# Patient Record
Sex: Female | Born: 1965 | Race: White | Hispanic: No | Smoking: Never smoker
Health system: Southern US, Community
[De-identification: ages and names within clinical notes are randomized; demographics above are authoritative.]

## PROBLEM LIST (undated history)

## (undated) DIAGNOSIS — I10 Essential (primary) hypertension: Secondary | ICD-10-CM

## (undated) DIAGNOSIS — E079 Disorder of thyroid, unspecified: Secondary | ICD-10-CM

## (undated) DIAGNOSIS — Z803 Family history of malignant neoplasm of breast: Secondary | ICD-10-CM

## (undated) DIAGNOSIS — E119 Type 2 diabetes mellitus without complications: Secondary | ICD-10-CM

## (undated) DIAGNOSIS — R7303 Prediabetes: Secondary | ICD-10-CM

## (undated) DIAGNOSIS — M549 Dorsalgia, unspecified: Secondary | ICD-10-CM

## (undated) DIAGNOSIS — Z800 Family history of malignant neoplasm of digestive organs: Secondary | ICD-10-CM

## (undated) DIAGNOSIS — M2550 Pain in unspecified joint: Secondary | ICD-10-CM

## (undated) DIAGNOSIS — IMO0001 Reserved for inherently not codable concepts without codable children: Secondary | ICD-10-CM

## (undated) DIAGNOSIS — R600 Localized edema: Secondary | ICD-10-CM

## (undated) DIAGNOSIS — Z83719 Family history of colon polyps, unspecified: Secondary | ICD-10-CM

## (undated) DIAGNOSIS — Z8371 Family history of colonic polyps: Secondary | ICD-10-CM

## (undated) HISTORY — DX: Disorder of thyroid, unspecified: E07.9

## (undated) HISTORY — DX: Reserved for inherently not codable concepts without codable children: IMO0001

## (undated) HISTORY — DX: Family history of malignant neoplasm of digestive organs: Z80.0

## (undated) HISTORY — DX: Family history of colonic polyps: Z83.71

## (undated) HISTORY — DX: Localized edema: R60.0

## (undated) HISTORY — DX: Essential (primary) hypertension: I10

## (undated) HISTORY — DX: Pain in unspecified joint: M25.50

## (undated) HISTORY — DX: Dorsalgia, unspecified: M54.9

## (undated) HISTORY — PX: TONSILLECTOMY: SUR1361

## (undated) HISTORY — DX: Family history of malignant neoplasm of breast: Z80.3

## (undated) HISTORY — DX: Family history of colon polyps, unspecified: Z83.719

## (undated) HISTORY — DX: Type 2 diabetes mellitus without complications: E11.9

## (undated) HISTORY — DX: Prediabetes: R73.03

---

## 1999-01-05 ENCOUNTER — Other Ambulatory Visit: Admission: RE | Admit: 1999-01-05 | Discharge: 1999-01-05 | Payer: Self-pay | Admitting: Gynecology

## 2000-03-08 ENCOUNTER — Encounter: Payer: Self-pay | Admitting: Family Medicine

## 2000-03-08 ENCOUNTER — Ambulatory Visit (HOSPITAL_COMMUNITY): Admission: RE | Admit: 2000-03-08 | Discharge: 2000-03-08 | Payer: Self-pay | Admitting: Family Medicine

## 2000-03-20 ENCOUNTER — Encounter: Admission: RE | Admit: 2000-03-20 | Discharge: 2000-03-20 | Payer: Self-pay | Admitting: Family Medicine

## 2000-03-20 ENCOUNTER — Encounter: Payer: Self-pay | Admitting: Family Medicine

## 2000-05-05 ENCOUNTER — Other Ambulatory Visit: Admission: RE | Admit: 2000-05-05 | Discharge: 2000-05-05 | Payer: Self-pay | Admitting: Gynecology

## 2001-05-25 ENCOUNTER — Other Ambulatory Visit: Admission: RE | Admit: 2001-05-25 | Discharge: 2001-05-25 | Payer: Self-pay | Admitting: Gynecology

## 2002-07-11 ENCOUNTER — Other Ambulatory Visit: Admission: RE | Admit: 2002-07-11 | Discharge: 2002-07-11 | Payer: Self-pay | Admitting: Gynecology

## 2003-09-22 ENCOUNTER — Other Ambulatory Visit: Admission: RE | Admit: 2003-09-22 | Discharge: 2003-09-22 | Payer: Self-pay | Admitting: Gynecology

## 2003-12-30 ENCOUNTER — Encounter: Admission: RE | Admit: 2003-12-30 | Discharge: 2003-12-30 | Payer: Self-pay | Admitting: Gynecology

## 2004-09-23 ENCOUNTER — Ambulatory Visit (HOSPITAL_COMMUNITY): Admission: RE | Admit: 2004-09-23 | Discharge: 2004-09-23 | Payer: Self-pay | Admitting: Gynecology

## 2004-09-23 ENCOUNTER — Other Ambulatory Visit: Admission: RE | Admit: 2004-09-23 | Discharge: 2004-09-23 | Payer: Self-pay | Admitting: Gynecology

## 2004-12-05 HISTORY — PX: ABDOMINAL HYSTERECTOMY: SHX81

## 2005-01-11 ENCOUNTER — Encounter: Admission: RE | Admit: 2005-01-11 | Discharge: 2005-01-11 | Payer: Self-pay | Admitting: Gynecology

## 2005-01-31 ENCOUNTER — Inpatient Hospital Stay (HOSPITAL_COMMUNITY): Admission: RE | Admit: 2005-01-31 | Discharge: 2005-02-02 | Payer: Self-pay | Admitting: Gynecology

## 2005-01-31 ENCOUNTER — Encounter (INDEPENDENT_AMBULATORY_CARE_PROVIDER_SITE_OTHER): Payer: Self-pay | Admitting: Specialist

## 2005-09-27 ENCOUNTER — Other Ambulatory Visit: Admission: RE | Admit: 2005-09-27 | Discharge: 2005-09-27 | Payer: Self-pay | Admitting: Gynecology

## 2006-04-10 ENCOUNTER — Encounter: Admission: RE | Admit: 2006-04-10 | Discharge: 2006-04-10 | Payer: Self-pay | Admitting: Gynecology

## 2006-10-04 ENCOUNTER — Other Ambulatory Visit: Admission: RE | Admit: 2006-10-04 | Discharge: 2006-10-04 | Payer: Self-pay | Admitting: Gynecology

## 2007-10-09 ENCOUNTER — Other Ambulatory Visit: Admission: RE | Admit: 2007-10-09 | Discharge: 2007-10-09 | Payer: Self-pay | Admitting: Gynecology

## 2007-10-09 ENCOUNTER — Encounter: Admission: RE | Admit: 2007-10-09 | Discharge: 2007-10-09 | Payer: Self-pay | Admitting: Gynecology

## 2008-05-28 ENCOUNTER — Other Ambulatory Visit: Admission: RE | Admit: 2008-05-28 | Discharge: 2008-05-28 | Payer: Self-pay | Admitting: Gynecology

## 2008-08-12 ENCOUNTER — Ambulatory Visit: Payer: Self-pay | Admitting: Gynecology

## 2008-11-26 ENCOUNTER — Encounter: Admission: RE | Admit: 2008-11-26 | Discharge: 2008-11-26 | Payer: Self-pay | Admitting: Gynecology

## 2008-12-09 ENCOUNTER — Other Ambulatory Visit: Admission: RE | Admit: 2008-12-09 | Discharge: 2008-12-09 | Payer: Self-pay | Admitting: Gynecology

## 2008-12-09 ENCOUNTER — Encounter: Payer: Self-pay | Admitting: Gynecology

## 2008-12-09 ENCOUNTER — Ambulatory Visit: Payer: Self-pay | Admitting: Gynecology

## 2008-12-16 ENCOUNTER — Ambulatory Visit: Payer: Self-pay | Admitting: Gynecology

## 2008-12-18 ENCOUNTER — Ambulatory Visit: Payer: Self-pay | Admitting: Gynecology

## 2009-02-26 ENCOUNTER — Ambulatory Visit: Payer: Self-pay | Admitting: Gynecology

## 2009-03-02 ENCOUNTER — Ambulatory Visit: Payer: Self-pay | Admitting: Gynecology

## 2009-03-03 ENCOUNTER — Ambulatory Visit (HOSPITAL_BASED_OUTPATIENT_CLINIC_OR_DEPARTMENT_OTHER): Admission: RE | Admit: 2009-03-03 | Discharge: 2009-03-03 | Payer: Self-pay | Admitting: Gynecology

## 2009-03-03 ENCOUNTER — Ambulatory Visit: Payer: Self-pay | Admitting: Gynecology

## 2009-03-03 ENCOUNTER — Encounter: Payer: Self-pay | Admitting: Gynecology

## 2009-03-17 ENCOUNTER — Ambulatory Visit: Payer: Self-pay | Admitting: Gynecology

## 2009-04-16 ENCOUNTER — Ambulatory Visit: Payer: Self-pay | Admitting: Gynecology

## 2009-11-01 ENCOUNTER — Emergency Department (HOSPITAL_COMMUNITY): Admission: EM | Admit: 2009-11-01 | Discharge: 2009-11-01 | Payer: Self-pay | Admitting: Emergency Medicine

## 2009-12-15 ENCOUNTER — Encounter: Admission: RE | Admit: 2009-12-15 | Discharge: 2009-12-15 | Payer: Self-pay | Admitting: Gynecology

## 2009-12-15 ENCOUNTER — Ambulatory Visit: Payer: Self-pay | Admitting: Gynecology

## 2009-12-15 ENCOUNTER — Other Ambulatory Visit: Admission: RE | Admit: 2009-12-15 | Discharge: 2009-12-15 | Payer: Self-pay | Admitting: Gynecology

## 2010-01-12 ENCOUNTER — Ambulatory Visit: Payer: Self-pay | Admitting: Gynecology

## 2010-12-17 ENCOUNTER — Other Ambulatory Visit
Admission: RE | Admit: 2010-12-17 | Discharge: 2010-12-17 | Payer: Self-pay | Source: Home / Self Care | Admitting: Gynecology

## 2010-12-17 ENCOUNTER — Encounter
Admission: RE | Admit: 2010-12-17 | Discharge: 2010-12-17 | Payer: Self-pay | Source: Home / Self Care | Attending: Gynecology | Admitting: Gynecology

## 2010-12-17 ENCOUNTER — Ambulatory Visit
Admission: RE | Admit: 2010-12-17 | Discharge: 2010-12-17 | Payer: Self-pay | Source: Home / Self Care | Attending: Gynecology | Admitting: Gynecology

## 2011-04-17 ENCOUNTER — Emergency Department (HOSPITAL_COMMUNITY)
Admission: EM | Admit: 2011-04-17 | Discharge: 2011-04-17 | Disposition: A | Discharge status: Home / Self Care | Attending: Emergency Medicine | Admitting: Emergency Medicine

## 2011-04-17 DIAGNOSIS — I10 Essential (primary) hypertension: Secondary | ICD-10-CM | POA: Insufficient documentation

## 2011-04-17 DIAGNOSIS — E039 Hypothyroidism, unspecified: Secondary | ICD-10-CM | POA: Insufficient documentation

## 2011-04-17 DIAGNOSIS — R209 Unspecified disturbances of skin sensation: Secondary | ICD-10-CM | POA: Insufficient documentation

## 2011-04-17 DIAGNOSIS — M545 Low back pain, unspecified: Secondary | ICD-10-CM | POA: Insufficient documentation

## 2011-04-17 DIAGNOSIS — Z79899 Other long term (current) drug therapy: Secondary | ICD-10-CM | POA: Insufficient documentation

## 2011-04-19 NOTE — Op Note (Signed)
NAME:  Rachel Simmons, Rachel Simmons                ACCOUNT NO.:  [id number]   MEDICAL RECORD NO.:  [id number]          PATIENT TYPE:  AMB   LOCATION:  [facility]                         FACILITY:  [facility]   PHYSICIAN:  Juan H. Lily Peer, M.D.DATE OF BIRTH:  10-26-1966   DATE OF PROCEDURE:  DATE OF DISCHARGE:                               OPERATIVE REPORT   INDICATIONS FOR OPERATION:  The patient is a 45 year old gravida 2, para  2 with VAIN 1 of the fourchette.   PREOPERATIVE DIAGNOSES:  1. VAIN 1 of the fourchette.  2. 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  3. A skin tag on the left buttock region.   ANESTHESIA:  General endotracheal anesthesia.   PROCEDURE PERFORMED:  CO2 laser ablation of vaginal dysplasia, the area  of fourchette, right labia minora, as well as excision of a left buttock  skin tag.   FINDINGS:  After application of acetic acid, the colposcope was brought  into view.  The vagina, cervix, perineum, and external genitalia were  inspected thoroughly.  At the area of the fourchette, leukoplakic area  was noted as well as a small area not seen before preoperatively was  noted in the right labia minora, consistent with mild dysplasia.  There  was no rectal lesions noted.   DESCRIPTION OF OPERATION:  The patient was adequately counseled.  She  was taken to the operating room where she underwent a successful general  endotracheal anesthesia.  She received a gram of cefotetan IV.  The legs  were placed in low lithotomy position.  The patient had voided before  coming to the operating room.  The colposcope was brought into view.  The vagina, cervix, external genitalia, perineum, and perirectal area  were inspected after applying acetic acid.  No lesions were noted except  a small area on the right labia minora inferior portion and the  extensive area that had been biopsied before, the a very of the  fourchette which was VAIN 1.  Also, she was found to have a small skin  tag on the  left buttocks region.  The right labia minora and the  fourchette perineum area was marked with a marking pen to allow at least  3-mm to 4-mm away from the dysplastic area to demarcate the area to be  ablated.  The CO2 laser was brought into view with a handheld piece and  with a brush-like technique, the area of the fourchette/peritoneum was  ablated to a depth of 3 mm.  The CO2 laser had been set at 10 watts on a  continuous mode.  For the right labia minora, the wattage was lowered to  8 watts in a continuous mode.  The left buttock skin tag was excised as  well, passed off the operative field for histological evaluation.  Silvadene cream was applied to the area.  The patient was extubated and  transferred to recovery with stable vital signs.  She received Toradol  30 mg IV and fluid resuscitation consisted of 400 mL of lactated  Ringers.      Juan H. Lily Peer, M.D.  Electronically  Signed    JHF/MEDQ  D:  03/03/2009  T:  03/03/2009  Job:  [id number]

## 2011-04-19 NOTE — H&P (Signed)
NAME:  Rachel Simmons, Rachel Simmons                ACCOUNT NO.:  [id number]   MEDICAL RECORD NO.:  [id number]          PATIENT TYPE:  AMB   LOCATION:  [facility]                         FACILITY:  [facility]   PHYSICIAN:  Juan H. Lily Peer, M.D.DATE OF BIRTH:  1966/07/24   DATE OF ADMISSION:  03/03/2009  DATE OF DISCHARGE:                              HISTORY & PHYSICAL   The patient is scheduled for surgery on Tuesday, March 30th, at 07:30  a.m. at [facility]  Please have history and physical  available.   CHIEF COMPLAINT:  VAIN 1, extensive of the area of the fourchette.   HISTORY OF PRESENT ILLNESS:  The patient is a 45 year old gravida 2,  para 2, who is scheduled to undergo CO2 laser ablation VAIN 1 of the  fourchette which was confirmed with biopsy in the office on December 16, 2008.  Her Pap smear demonstrated was negative.  No other lesion.   PAST MEDICAL HISTORY:  She denies any allergies.  She has had history of  gestational diabetes.  She has had 2 normal spontaneous vaginal  deliveries and had endometrial hyperplasia in October 2005.  Her  surgeries have consisted tonsillectomy and appendectomy.  Lasix surgery  of both eyes and supracervical hysterectomy in 2006, currently on  hydrochlorothiazide for hypertension (10/25) as well as calcium and  vitamin D.   FAMILY HISTORY:  Mother with type 2 diabetes, cardiovascular disease,  and hypertension.   PHYSICAL EXAMINATION:  GENERAL:  The patient weighs 282 pounds.  HEENT:  Unremarkable.  NECK:  Supple.  Trachea midline.  No carotid bruits.  No thyromegaly.  LUNGS:  Clear to auscultation.  No rhonchi or wheezing.  HEART:  Regular rate and rhythm.  No murmurs or gallops.  BREAST:  Exam not done.  ABDOMEN:  Soft and nontender.  No rebound or guarding.  PELVIC:  Cervical stump is still evident at the area of the fourchette.  Leukoplakic area was noted extensive throughout the area of the  fourchette.  No other lesions were noted.  RECTAL:  Exam deferred.   ASSESSMENT:  A 45 year old gravida 2, para 2 with extensive fourchette  VAIN 1, confirmed by biopsy, is scheduled to undergo CO2 laser ablation  of this area under general anesthesia.  The risks, benefits, and pros  and cons of the operation were discussed with the patient.  All  questions were answered, and we will follow accordingly.   PLAN:  The patient is scheduled for CO2 laser ablation of VAIN 1 of the  fourchette on Tuesday, March 30, at 07:30 a.m. at [facility].  Please have history and physical available.      Juan H. Lily Peer, M.D.  Electronically Signed     JHF/MEDQ  D:  03/02/2009  T:  03/02/2009  Job:  [id number]

## 2011-04-20 ENCOUNTER — Other Ambulatory Visit: Payer: Self-pay | Admitting: Family Medicine

## 2011-04-20 DIAGNOSIS — M545 Low back pain: Secondary | ICD-10-CM

## 2011-04-20 DIAGNOSIS — M5410 Radiculopathy, site unspecified: Secondary | ICD-10-CM

## 2011-04-22 NOTE — Op Note (Signed)
NAME:  Rachel Simmons, Rachel Simmons                ACCOUNT NO.:  [id number]   MEDICAL RECORD NO.:  [id number]          PATIENT TYPE:  INP   LOCATION:  9399                          FACILITY:  [facility]   PHYSICIAN:  Juan H. Lily Peer, M.D.DATE OF BIRTH:  [birth year]   DATE OF PROCEDURE:  01/31/2005  DATE OF DISCHARGE:                                 OPERATIVE REPORT   PREOPERATIVE DIAGNOSES:  1.  Dysfunctional uterine bleeding.  2.  History of endometrial hyperplasia.  3.  Morbid obesity.   POSTOPERATIVE DIAGNOSES:  1.  Dysfunctional uterine bleeding.  2.  History of endometrial hyperplasia.  3.  Morbid obesity.   PROCEDURE:  Transabdominal supracervical hysterectomy.   ANESTHESIA:  General endotracheal anesthesia.   SURGEON:  Juan H. Lily Peer, M.D.   ASSISTANT:  Rande Brunt. Eda Paschal, M.D.   INDICATIONS FOR PROCEDURE:  45 year old gravida 2, para 2 with history of  dysfunctional uterine bleeding since last year.  Workup has consisted of  endometrial biopsy demonstrating simple endometrial hyperplasia.  She had  been placed on ___________ agent for three months, follow up with biopsy,  clearance of endometrial hyperplasia, but the patient continued with  dysfunctional uterine bleeding.  Due to her history of hypertension,  diabetes, obesity, and increased risk for endometrial carcinoma, it was  decided to proceed with an abdominal hysterectomy.   FINDINGS:  The patient is morbidly obese.  Normal-appearing boggy uterus.  Normal tubes and ovaries.   DESCRIPTION OF PROCEDURE:  After the patient was adequately counseled, she  was taken to the operating room where she underwent successful general  endotracheal anesthesia.  She had received 1 g of Cefotan for prophylaxis.  Marcaine 0.25% was infiltrated into the planned Pfannenstiel skin incision  site for approximately 10 cc.   With the scalpel, the incision was carried down through the skin and  subcutaneous tissue down to the rectus fascia.  A  midline nick was made.  The fascia was incised in a transverse fashion.  The midline raphe was  entered.  The peritoneal cavity was entered cautiously.  O'Connor-O'Sullivan  retractors were in place.  The patient was placed in steep Trendelenburg  position.  Exposure became a difficult situation due to the patient's  obesity, and it was decided that as the case progressed that a supracervical  hysterectomy would be safer than trying to remove the cervix due to the fact  that it was long and also to prevent inadvertent injury to the ureters.  Both round ligaments had separately been identified and transected.  The  broad ligament was incised to a level close to the internal cervical os.  The posterior broad ligament was then penetrated, and the Heaney clamp was  placed hugging the uterus and incorporating the utero-ovarian ligament and  proximal fallopian tube.  This was transected.  The remaining right tube and  ovary was free-tied with 0 Vicryl suture followed by transfixation stitch.  A similar procedure was carried out on the contralateral side.  Skeletonization was accomplished to the level of the uterine arteries which  were clamped, cut, and suture ligated with 0 Vicryl  suture.  The cervix was  then amputated.  Individual hemostatic sutures were placed on both sides of  the parametrium that were bleeding.  Once this was accomplished, the uterus  amputated from the level near the uterine artery/internal cervical os region  and passed off of the operative field.  The endocervical canal was  cauterized.  The pelvic cavity was copiously irrigated with normal saline  solution. Surgicel was used for additional hemostasis.  Sponge count and  needle count were correct.  The O'Connor-O'Sullivan retractor was then  removed.  The visceral peritoneum was not reapproximated, but the rectus  fascia was closed with a running locking stitch of 0 Vicryl suture.  A 7  French Jackson-Pratt drain was  placed suprafascial and exiting from a  separate stab wound.  The subcutaneous bleeders were Bovie cauterized.  The  skin was reapproximated with skin clips followed by Xeroform gauze followed  by a dressing.  The Jackson-Pratt drain was secured with a 3-0 silk suture.   The patient was awakened and transferred to the recovery room with stable  vital signs.  Blood loss from the procedure was recorded as 400 cc.  IV  fluids were 2000 cc of lactated Ringer's.  She received 1 g of Cefotan  preoperatively.  Urine output was 300 cc.      JHF/MEDQ  D:  01/31/2005  T:  01/31/2005  Job:  [id number]

## 2011-04-22 NOTE — H&P (Signed)
NAME:  Rachel Simmons, Rachel Simmons                ACCOUNT NO.:  [id number]   MEDICAL RECORD NO.:  [id number]          PATIENT TYPE:  INP   LOCATION:  NA                            FACILITY:  [facility]   PHYSICIAN:  Juan H. Lily Peer, M.D.DATE OF BIRTH:  Dec 01, 1966   DATE OF ADMISSION:  01/31/2005  DATE OF DISCHARGE:                                HISTORY & PHYSICAL   CHIEF COMPLAINT:  1.  Dysfunctional uterine bleeding.  2.  Past history of endometrial hyperplasia.   HISTORY:  Ms. Zeis is a 45 year old gravida 2, para 2 who was seen in the  office on January 26, 2005, for a preoperative consultation as a result of  her history of dysfunctional uterine bleeding and history of endometrial  hyperplasia.  She had been on Megace 20 mg for 15 days of each month and a  followup biopsy on January 09, 2005, demonstrated inactive endometrial  glands, breakdown but no evidence of hyperplasia or malignancy.  She had a  sonohysterogram on January 15, 2005, which demonstrated endometrial stripe  of 6.2 mm and a small intramural myoma.  The right ovary was normal.  The  left ovary was normal as well, previously had a cyst several months ago  which is no longer present.  There is no fluid in the cul-de-sac.  The  sonohysterogram demonstrated an anterior wall thickness which measured 29 x  5 mm and was difficult to inject the fluid secondary to uterine contraction.  The endometrial biopsy was done that day and as mentioned above, there was  no evidence of hyperplasia present.  The patient is scheduled to undergo a  total abdominal hysterectomy on January 31, 2005, at 7:30 a.m.  She cannot  tolerate hormones to regulate her periods, and this is the reason we are  proceeding with a hysterectomy.  She is no longer interested in having any  more children, and that is not an issue.   PAST MEDICAL HISTORY/MEDICATIONS:  She has had gestational diabetes.  She  has had two vaginal deliveries.  She has a history of  hypothyroidism for  which she is currently on Levothroid 50 mcg every day and for hypertension  she is on hydrochlorothiazide 10/25, one tablet every day and she had been  on Megace 20 mg b.i.d. for 15 days of each month for the past three months.  Other surgeries have consisted of tonsillectomy and adenoidectomy and LASIK  surgery of her eyes.   FAMILY HISTORY:  Mother with type 2 diabetes.   ALLERGIES:  The patient denies any allergies.   PHYSICAL EXAMINATION:  VITAL SIGNS:  The patient weighs 286 pounds, blood  pressure 122/76.  HEENT:  Unremarkable.  NECK:  Supple. Trachea midline.  No carotid bruits.  No thyromegaly.  LUNGS:  Clear to auscultation without rhonchi or wheezes.  HEART:  Regular rate and rhythm.  No murmurs or gallops.  BREASTS:  Exam not done.  ABDOMEN:  Soft, nontender, pendulous but no rebound or guarding.  PELVIC:  Bartholin's, urethra and skeins glands within normal limits.  Vagina and cervix showed no discharge.  Uterus  normal size, shape and  consistency.  No adnexal masses or tenderness.  Vaginal cavity somewhat  narrow.  RECTAL:  Exam not done.   ASSESSMENT:  A 45 year old gravida 2, para 2 with dysfunctional uterine  bleeding, history of endometrial hyperplasia, placed on progestational  agent, 15 days of each month for three months, following biopsy with no  evidence of persistent hyperplasia but the patient's history of hypertension  and diabetes and patient being obese and continuing with the dysfunctional  uterine bleeding, it was decided to proceed with definitive surgery.  She  has a very narrow pelvis.  For this reason, it was decided to proceed with  the abdominal approach.  During the preoperative consultation in the office  on January 10, 2004, we outlined potential risks to include infection  although she will receive prophylactic antibiotics, there is a risk of deep  venous thrombosis  with subsequent pulmonary embolism.  She will have PAS   stockings prophylactically.  Also in the event of uncontrolled hemorrhages,  she would need a blood transfusion or blood products.  She is fully aware of  potential risks such as anaphylactic reactions, hepatitis and AIDS and also  the risks of intra-abdominal trauma requiring corrective surgery at that  point.  The patient is fully aware that she will not be able to have any  more children, and all efforts will be made to leave both ovaries unless  there is indication or findings intraoperatively that will require to remove  either one or both ovaries which the patient is given consent for.  All of  these issues were discussed with the patient and will follow accordingly.   PLAN:  Schedule for total abdominal hysterectomy on January 31, 2005, at  7:30 a.m. at [facility]      JHF/MEDQ  D:  01/30/2005  T:  01/30/2005  Job:  [id number]

## 2011-04-22 NOTE — Discharge Summary (Signed)
NAME:  Rachel Simmons, Rachel Simmons                ACCOUNT NO.:  [id number]   MEDICAL RECORD NO.:  [id number]          PATIENT TYPE:  INP   LOCATION:  9316                          FACILITY:  [facility]   PHYSICIAN:  Juan H. Lily Peer, M.D.DATE OF BIRTH:  04/19/1966   DATE OF ADMISSION:  01/31/2005  DATE OF DISCHARGE:  02/02/2005                                 DISCHARGE SUMMARY   TOTAL DAYS HOSPITALIZED:  Two.   HISTORY:  The patient is a 45 year old gravida 2 para 2 who on the morning  of January 31, 2005 underwent a transabdominal transcervical hysterectomy  for menorrhagia and endometrial hyperplasia.  She had a 400 cc blood loss,  received 2,000 cc of lactated Ringer's.  She had received 1 g of Cefotan for  prophylaxis and had PAS stockings.  Due to the patient's obesity, the cervix  was not obtainable to be safely removed, and a supracervical hysterectomy  had been done.  The patient did well postoperatively, with the exception  that she was kept in the AICU because she had some isolated PVCs but  returned to normal sinus rhythm spontaneously, and she was transferred to  the floor.  She was placed on clear diet the first 24 hours after surgery.  Her Foley was removed.  Her PCA pump was removed.  She was afebrile.  She  did have a Jackson-Pratt drain that was left for 30 hours which was removed.  It was a suprafascial JP drain.  Her postop one hemoglobin and hematocrit  were 11.3 and 33.3, respectively, with a platelet count of 314,000.  The  patient on her second postop day was up ambulating, voiding well, had taken  a shower, passed flatus, and tolerated a regular diet, and is ready for  discharge home.   FINAL DIAGNOSES:  1.  Dysfunctional uterine bleeding.  2.  History of endometrial hyperplasia.  3.  Isolated unifocal premature ventricular contractions postoperatively,      resolved.  4.  Hypertension.   PROCEDURE PERFORMED:  Transabdominal supracervical hysterectomy.   FINAL DISPOSITION  AND FOLLOWUP:  The patient was discharged home on her  second postoperative day.  She was up ambulating, tolerating a regular diet  well.  She will return back to the office in 72 hours to have her staples  removed.  She will be taking Motrin 800 mg t.i.d., and she will intervene  with Lortab 7.5/500 q.4-6 h. p.r.n. pain.  Discharge instructions were  provided.      JHF/MEDQ  D:  02/02/2005  T:  02/02/2005  Job:  [id number]

## 2011-04-22 NOTE — H&P (Signed)
NAME:  Rachel Simmons, Rachel Simmons                ACCOUNT NO.:  [id number]   MEDICAL RECORD NO.:  [id number]          PATIENT TYPE:  INP   LOCATION:  NA                            FACILITY:  [facility]   PHYSICIAN:  Juan H. Lily Peer, M.D.DATE OF BIRTH:  1966/07/26   DATE OF ADMISSION:  01/31/2005  DATE OF DISCHARGE:                                HISTORY & PHYSICAL   CHIEF COMPLAINT:  1.  Dysfunctional uterine bleeding.  2.  History of endometrial hyperplasia.   HISTORY OF PRESENT ILLNESS:  The patient is a 45 year old gravida 2, para 2,  who has been having history of menometrorrhagia since last year.  The  patient had an endometrial biopsy and sonohysterogram recently.  She had  been post treatment Megace 20 mg b.i.d. for 15 days of each month which she  took in November, December, and January due to the fact that her workup for  dysfunctional uterine bleeding had resulted in an endometrial biopsy from  October of 2005 with evidence of focal simple hyperplasia with separate  fragments suggestive of endometrial polyp.  Her follow-up endometrial biopsy  on February 1, demonstrated inactive endometrial glands, and decidualized  normal consistent with exogenous progestin therapy.  No evidence of  hyperplasia or malignancy seen.  The patient has been using condom for  contraception.  The patient is obese at 286 pounds and somewhat narrow  pelvis, although she has delivered two children vaginally before.  The  patient is scheduled to undergo a total abdominal hysterectomy with  conservation of both ovaries unless any abnormalities are detected and she  gives authorization for removal of one or both ovaries.  The patient has a  history of hypertension and hypothyroidism and has not been able to tolerate  birth control pills in the past.  She wants definitive surgery done.   PAST MEDICAL HISTORY:  1.  Gestational diabetes.  2.  Two normal spontaneous vaginal deliveries.  3.  History of endometrial  hyperplasia in October of 2005.  4.  Hypothyroidism for which she is on Synthroid 50 mcg daily.  5.  History of hypertension for which she takes HCTZ 10.4/25 and she had      been on Megace 20 mg b.i.d. for 15 days of each month for the past three      months.   PAST SURGICAL HISTORY:  Tonsillectomy and adenoidectomy.  LASIK surgery of  both eyes.   ALLERGIES:  No known drug allergies.   FAMILY HISTORY:  Mother with type 2 diabetes.   PHYSICAL EXAMINATION:  VITAL SIGNS:  The patient weighs 286 pounds.  Blood  pressure 122/76.  HEENT:  Unremarkable.  NECK:  Supple.  Trachea in midline.  No carotid bruits and no thyromegaly.  LUNGS:  Clear to auscultation without any rhonchi or wheezes.  HEART:  Regular rate and rhythm with no murmurs or gallops.  BREASTS:  Done at the time of her annual in October of 2005.  ABDOMEN:  Soft and nontender without rebound or guarding.  PELVIC:  Bartholin's, urethra, and Skene's glands within normal limits.  Vagina and cervix with  no gross lesions.  Uterus upper limits of normal,  anteverted, normal size, shape, and consistency.  Adnexa without any  palpable masses or tenderness.  RECTAL:  Deferred.   ASSESSMENT:  A 45 year old gravida 2, para 2, who has had history of  dysfunctional uterine bleeding, endometrial biopsy demonstrated evidence of  simple hyperplasia.  Follow-up Pap smear after progestational agent for  three months, resolution of her hyperplasia, but continues to have  dysfunctional uterine bleeding.  The patient is obese and hypertensive and  she is at increased risk for not only recurrence for endometrial  hyperplasia, but for endometrial carcinoma.  Since she is not interested in  having anymore children, she has decided that she wants to just stop this  bleeding nuisance and she is scheduled to undergo a total abdominal  hysterectomy with ovarian conservation on Monday February 27, at 7:30 a.m.  at [facility].  The risks,  benefits, pros, and cons were discussed  including infection, although, she will receive prophylactic antibiotics,  the risks for deep venous thrombosis and subsequent pulmonary embolism were  discussed, although, she will have PSA stockings.  We will also discuss the  risks for anaphylactic reaction, hepatitis, and AIDS in the event of blood  transfusion and blood products.  Also the risk for trauma to internal organs  requiring corrective surgery at that point.  The patient is also fully aware  that she will never be able to have anymore children and she feels confident  with that decision as well as her spouse.  All questions are answered and  will follow accordingly.   PLAN:  The patient is scheduled for a total abdominal hysterectomy on  Monday, February 27, at 7:30 a.m. at [facility].  Please have history  and physical available.      JHF/MEDQ  D:  01/27/2005  T:  01/27/2005  Job:  [id number]

## 2011-04-24 ENCOUNTER — Ambulatory Visit
Admission: RE | Admit: 2011-04-24 | Discharge: 2011-04-24 | Disposition: A | Discharge status: Home / Self Care | Source: Ambulatory Visit | Attending: Family Medicine | Admitting: Family Medicine

## 2011-04-24 DIAGNOSIS — M545 Low back pain: Secondary | ICD-10-CM

## 2011-04-24 DIAGNOSIS — M5410 Radiculopathy, site unspecified: Secondary | ICD-10-CM

## 2014-01-24 ENCOUNTER — Other Ambulatory Visit: Payer: Self-pay

## 2014-01-24 DIAGNOSIS — Z1231 Encounter for screening mammogram for malignant neoplasm of breast: Secondary | ICD-10-CM

## 2014-02-11 ENCOUNTER — Ambulatory Visit
Admission: RE | Admit: 2014-02-11 | Discharge: 2014-02-11 | Disposition: A | Discharge status: Home / Self Care | Source: Ambulatory Visit

## 2014-02-11 DIAGNOSIS — Z1231 Encounter for screening mammogram for malignant neoplasm of breast: Secondary | ICD-10-CM

## 2014-03-17 ENCOUNTER — Other Ambulatory Visit: Payer: Self-pay | Admitting: Orthopedic Surgery

## 2014-03-17 DIAGNOSIS — M25561 Pain in right knee: Secondary | ICD-10-CM

## 2014-03-22 ENCOUNTER — Ambulatory Visit
Admission: RE | Admit: 2014-03-22 | Discharge: 2014-03-22 | Disposition: A | Discharge status: Home / Self Care | Source: Ambulatory Visit | Attending: Orthopedic Surgery | Admitting: Orthopedic Surgery

## 2014-03-22 ENCOUNTER — Other Ambulatory Visit

## 2014-03-22 DIAGNOSIS — M25561 Pain in right knee: Secondary | ICD-10-CM

## 2014-12-05 LAB — HM DIABETES EYE EXAM

## 2015-03-24 ENCOUNTER — Other Ambulatory Visit: Payer: Self-pay

## 2015-03-24 DIAGNOSIS — Z1231 Encounter for screening mammogram for malignant neoplasm of breast: Secondary | ICD-10-CM

## 2015-04-21 DIAGNOSIS — E1142 Type 2 diabetes mellitus with diabetic polyneuropathy: Secondary | ICD-10-CM | POA: Insufficient documentation

## 2015-04-22 ENCOUNTER — Ambulatory Visit
Admission: RE | Admit: 2015-04-22 | Discharge: 2015-04-22 | Disposition: A | Discharge status: Home / Self Care | Source: Ambulatory Visit

## 2015-04-22 DIAGNOSIS — Z1231 Encounter for screening mammogram for malignant neoplasm of breast: Secondary | ICD-10-CM

## 2015-04-22 LAB — HM MAMMOGRAPHY

## 2015-04-22 LAB — HM PAP SMEAR: HM Pap smear: NEGATIVE

## 2015-04-22 LAB — CBC AND DIFFERENTIAL
HCT: 36 % (ref 36–46)
HEMOGLOBIN: 12.7 g/dL (ref 12.0–16.0)
Platelets: 244 10*3/uL (ref 150–399)
WBC: 23.3 10^3/mL

## 2015-04-23 ENCOUNTER — Telehealth: Payer: Self-pay | Admitting: Genetic Counselor

## 2015-04-23 NOTE — Telephone Encounter (Signed)
genetic appt-s/w patient and gave appt for 05/25 @ 2:30 w/genetic counselor

## 2015-04-23 NOTE — Telephone Encounter (Signed)
genetic appt-left message for patient to return call

## 2015-04-29 ENCOUNTER — Encounter: Payer: Self-pay | Admitting: Genetic Counselor

## 2015-04-29 ENCOUNTER — Ambulatory Visit (HOSPITAL_BASED_OUTPATIENT_CLINIC_OR_DEPARTMENT_OTHER): Admitting: Genetic Counselor

## 2015-04-29 ENCOUNTER — Other Ambulatory Visit

## 2015-04-29 DIAGNOSIS — Z315 Encounter for genetic counseling: Secondary | ICD-10-CM | POA: Diagnosis not present

## 2015-04-29 DIAGNOSIS — Z800 Family history of malignant neoplasm of digestive organs: Secondary | ICD-10-CM | POA: Insufficient documentation

## 2015-04-29 DIAGNOSIS — Z803 Family history of malignant neoplasm of breast: Secondary | ICD-10-CM | POA: Diagnosis not present

## 2015-04-29 DIAGNOSIS — Z8371 Family history of colonic polyps: Secondary | ICD-10-CM

## 2015-04-29 DIAGNOSIS — Z83719 Family history of colon polyps, unspecified: Secondary | ICD-10-CM | POA: Insufficient documentation

## 2015-04-29 NOTE — Progress Notes (Signed)
Patient Name: Rachel Simmons Patient Age: 49 y.o. Encounter Date: 04/29/2015  Referring Physician: Lovette Cliche, MD   Ms. Rachel Simmons, a 50 y.o. female, is being seen at the [facility] due to a family history of cancer. She presents to clinic today to discuss the possibility of a hereditary predisposition to cancer and discuss whether genetic testing is warranted.  HISTORY OF PRESENT ILLNESS: Rachel Simmons has no personal history of cancer. She reported that she had a hysterectomy in 2006 due to a history of abnormal Paps, but that her cervix was not removed due to complications during surgery. Her ovaries remain intact as well. She has a yearly mammogram, clinical breast exam and gynecologic exam. She has not yet initiated colon screenings.  Past Medical History  Diagnosis Date  . Family history of breast cancer   . Family history of colon cancer   . Family history of polyps in the colon     Past Surgical History  Procedure Laterality Date  . Abdominal hysterectomy  2006    reports ovaries and cervix are intact    History   Social History  . Marital Status: Married    Spouse Name: N/A  . Number of Children: N/A  . Years of Education: N/A   Social History Main Topics  . Smoking status: Not on file  . Smokeless tobacco: Not on file  . Alcohol Use: Not on file  . Drug Use: Not on file  . Sexual Activity: Not on file   Other Topics Concern  . Not on file   Social History Narrative  . No narrative on file     FAMILY HISTORY:   During the visit, a 4-generation pedigree was obtained. Family tree will be sent for scanning and will be in EPIC under the Media tab.  Significant diagnoses include the following:  Family History  Problem Relation Age of Onset  . Breast cancer Mother     97s; deceased 15  . Colon polyps Mother     Dx 20s; #/type unknown  . Cancer Father     lung cancer; smoker; deceased 37  . Breast cancer Sister 17    triple negative;  currently 67  . Cancer Maternal Aunt     lung cancer; smoker; currently 18  . Colon cancer Maternal Uncle     Dx 110s; currently 12  . Breast cancer Paternal Aunt     Dx 66s; currently 29s  . Cancer Paternal Uncle     liver; heavy drinker  . Cancer Maternal Grandfather     unk. primary; deceased 22s  . Cancer Paternal Grandmother     Dx 79s; unknown primary  . Breast cancer Sister 68    currently 21  . Uterine cancer Sister 55    currently 83  . Cancer Paternal Uncle     lung; heavy smoker  . Breast cancer Cousin     mat first cousin through aunt; poss. breast ca; had mastectomy; currently 50  . Ovarian cancer Other     mat grandmother's sister  . Cancer Other     stomach cancer; mat grandmother's sister  . Colon polyps Brother     age, #, type unknown    Additionally, Rachel Simmons has a son (age 21) and a daughter (age 47). She has one other brother in addition to the siblings above. She does not know much about her paternal relatives or her maternal cousins.  Rachel Simmons's ancestry is [place] and  [place]. There is no known Jewish ancestry and no consanguinity.  ASSESSMENT AND PLAN: Rachel Simmons is a 49 y.o. female with a family history of various cancers as noted above. This history is suggestive of a hereditary predisposition to cancer, and no one in the family who has had cancer is willing to pursue testing. We discussed the limitations of testing an unaffected individual. We reviewed the characteristics, features and inheritance patterns of hereditary cancer syndromes. We also discussed the process of testing, insurance coverage and implications of results. Rachel Simmons understood that a negative result in her does not mean she is at general population risk of breast cancer. In fact, unless a causative mutation is identified in her family, she is recommended to see a breast specialist to discuss risk-reduction options and heightened screenings for breast cancer. She likely meets criteria for a  discussion about Tamoxifen as well as adding a breast MRI to her yearly mammogram regimen.   Rachel Simmons wished to pursue genetic testing and a blood sample will be sent to [facility] for analysis of the 32 genes on the CancerNext panel (APC, ATM, BARD1, BRCA1, BRCA2, BRIP1, BMPR1A, CDH1, CDK4, CDKN2A, CHEK2, EPCAM, GREM1, MLH1, MRE11A, MSH2, MSH6, MUTYH, NBN, NF1, PALB2, PMS2, POLD1, POLE, PTEN, RAD50, RAD51C, RAD51D, SMAD4, SMARCA4, STK11, and TP53). This panel was selected to interrogate both the breast and colon genes. We discussed the implications of a positive, negative and/ or Variant of Uncertain Significance (VUS) result. Results should be available in approximately 4 weeks, at which point we will contact her and address implications for her as well as address genetic testing for at-risk family members, if needed.    We encouraged Rachel Simmons to remain in contact with Cancer Genetics annually so that we can update the family history and inform her of any changes in cancer genetics and testing that may be of benefit for this family. Ms.  Simmons's questions were answered to her satisfaction today.   Thank you for the referral and allowing [place] to share in the care of your patient.   The patient was seen for a total of 30 minutes, greater than 50% of which was spent face-to-face counseling. This patient was discussed with the overseeing provider who agrees with the above.   Steele Berg, MS, [place] Certified Genetic Counselor phone: [phone] [email or web address]

## 2015-05-21 ENCOUNTER — Encounter: Payer: Self-pay | Admitting: Genetic Counselor

## 2015-05-21 DIAGNOSIS — Z1379 Encounter for other screening for genetic and chromosomal anomalies: Secondary | ICD-10-CM | POA: Insufficient documentation

## 2015-05-21 NOTE — Progress Notes (Signed)
GENETIC TEST RESULTS  Patient Name: Rachel Simmons Patient Age: 49 y.o. Encounter Date: 05/21/2015  Referring Physician: Lovette Cliche, MD   Ms. Marzan was called today to discuss genetic test results. Please see the Genetics note from her visit on 04/29/15 for a detailed discussion of her personal and family history.  GENETIC TESTING: At the time of Ms. Failla's visit, we recommended she pursue genetic testing of multiple genes on the  gene panel. This test, which included sequencing and deletion/duplication analysis of 32 genes, was performed at [facility]. Testing was normal and did not reveal a mutation in these genes. The genes tested were APC, ATM, BARD1, BRCA1, BRCA2, BRIP1, BMPR1A, CDH1, CDK4, CDKN2A, CHEK2, EPCAM, GREM1, MLH1, MRE11A, MSH2, MSH6, MUTYH, NBN, NF1, PALB2, PMS2, POLD1, POLE, PTEN, RAD50, RAD51C, RAD51D, SMAD4, SMARCA4, STK11, and TP53.  We discussed with Ms. Kalp that since the current test is not perfect, it is possible there may be a gene mutation that current testing cannot detect, but that chance is small. We also discussed that it is possible that a different genetic factor, which was not part of this testing or has not yet been discovered, is responsible for the cancer diagnoses in the family. Finally, it may be that there is a detectable mutation in her family that she did not inherit. Should Ms. Paz wish to discuss or pursue this additional testing, we are happy to coordinate this at any time, but do not feel that she is at significant risk of harboring a mutation in a different gene.     CANCER SCREENING: This normal result is somewhat reassuring for  Ms. Mineer, but she is aware that she is at increased risk of breast cancer due to her family history of 3 first-degree relatives with breast cancer. We recommended Ms. Galas continue to follow the cancer screening guidelines provided by her primary physician, but also recommend she see a breast specialist to consider adding  a yearly breast MRI to her yearly mammogram, as well as have a discussion about other risk-reducing measures. Given the family history of colon polyps reported in her mother and brother, a referral to a gastroenterologist is indicated.  FAMILY MEMBERS: We strongly recommend that her sisters who had breast cancer undergo genetic counseling and testing as this may help clarify Ms. Music's risks. If they have a pathogenic mutation in one of the above genes, this will mean Ms. Ehrsam is likely at general population risk of breast cancer. Please let us know if we can help facilitate testing. Genetic counselors can be located in other cities, by visiting the website of the [employer] (ArtistMovie.se) and Field seismologist for a Dietitian by zip code.  Lastly, we discussed with Ms. Urieta that cancer genetics is a rapidly advancing field and it is possible that new genetic tests will be appropriate for her in the future. We encouraged her to remain in contact with [place] on an annual basis so we can update her personal and family histories, and let her know of advances in cancer genetics that may benefit the family. Our contact number was provided. Ms. Mccalla's questions were answered to her satisfaction today, and she knows she is welcome to call anytime with additional questions.    Steele Berg, MS, [place] Certified Genetic Counselor phone: [phone] [email or web address]

## 2015-05-29 ENCOUNTER — Other Ambulatory Visit (HOSPITAL_COMMUNITY)
Admission: RE | Admit: 2015-05-29 | Discharge: 2015-05-29 | Disposition: A | Discharge status: Home / Self Care | Source: Ambulatory Visit | Attending: Oncology | Admitting: Oncology

## 2015-05-29 DIAGNOSIS — D72829 Elevated white blood cell count, unspecified: Secondary | ICD-10-CM | POA: Insufficient documentation

## 2015-10-06 DIAGNOSIS — C9110 Chronic lymphocytic leukemia of B-cell type not having achieved remission: Secondary | ICD-10-CM | POA: Diagnosis not present

## 2016-04-20 LAB — CBC AND DIFFERENTIAL
HCT: 38 (ref 36–46)
HEMOGLOBIN: 13.1 (ref 12.0–16.0)
PLATELETS: 237 (ref 150–399)
WBC: 23.9

## 2016-04-20 LAB — HM DIABETES EYE EXAM

## 2016-05-20 ENCOUNTER — Other Ambulatory Visit: Payer: Self-pay | Admitting: Family Medicine

## 2016-05-20 DIAGNOSIS — Z1231 Encounter for screening mammogram for malignant neoplasm of breast: Secondary | ICD-10-CM

## 2016-08-18 ENCOUNTER — Ambulatory Visit (INDEPENDENT_AMBULATORY_CARE_PROVIDER_SITE_OTHER): Admitting: Family Medicine

## 2016-08-18 ENCOUNTER — Ambulatory Visit
Admission: RE | Admit: 2016-08-18 | Discharge: 2016-08-18 | Disposition: A | Discharge status: Home / Self Care | Source: Ambulatory Visit | Attending: Family Medicine | Admitting: Family Medicine

## 2016-08-18 ENCOUNTER — Ambulatory Visit

## 2016-08-18 ENCOUNTER — Encounter: Payer: Self-pay | Admitting: Family Medicine

## 2016-08-18 VITALS — BP 109/78 | HR 89 | Ht 68.25 in | Wt 301.7 lb

## 2016-08-18 DIAGNOSIS — Z8371 Family history of colonic polyps: Secondary | ICD-10-CM

## 2016-08-18 DIAGNOSIS — E079 Disorder of thyroid, unspecified: Secondary | ICD-10-CM

## 2016-08-18 DIAGNOSIS — C91Z0 Other lymphoid leukemia not having achieved remission: Secondary | ICD-10-CM | POA: Diagnosis not present

## 2016-08-18 DIAGNOSIS — E1159 Type 2 diabetes mellitus with other circulatory complications: Secondary | ICD-10-CM | POA: Insufficient documentation

## 2016-08-18 DIAGNOSIS — E119 Type 2 diabetes mellitus without complications: Secondary | ICD-10-CM

## 2016-08-18 DIAGNOSIS — Z315 Encounter for genetic counseling: Secondary | ICD-10-CM

## 2016-08-18 DIAGNOSIS — IMO0001 Reserved for inherently not codable concepts without codable children: Secondary | ICD-10-CM

## 2016-08-18 DIAGNOSIS — I10 Essential (primary) hypertension: Secondary | ICD-10-CM | POA: Diagnosis not present

## 2016-08-18 DIAGNOSIS — F4323 Adjustment disorder with mixed anxiety and depressed mood: Secondary | ICD-10-CM

## 2016-08-18 DIAGNOSIS — C9110 Chronic lymphocytic leukemia of B-cell type not having achieved remission: Secondary | ICD-10-CM | POA: Insufficient documentation

## 2016-08-18 DIAGNOSIS — E039 Hypothyroidism, unspecified: Secondary | ICD-10-CM | POA: Insufficient documentation

## 2016-08-18 DIAGNOSIS — Z800 Family history of malignant neoplasm of digestive organs: Secondary | ICD-10-CM

## 2016-08-18 DIAGNOSIS — M549 Dorsalgia, unspecified: Secondary | ICD-10-CM

## 2016-08-18 DIAGNOSIS — G8929 Other chronic pain: Secondary | ICD-10-CM | POA: Insufficient documentation

## 2016-08-18 DIAGNOSIS — Z803 Family history of malignant neoplasm of breast: Secondary | ICD-10-CM

## 2016-08-18 DIAGNOSIS — Z1379 Encounter for other screening for genetic and chromosomal anomalies: Secondary | ICD-10-CM

## 2016-08-18 DIAGNOSIS — Z1231 Encounter for screening mammogram for malignant neoplasm of breast: Secondary | ICD-10-CM

## 2016-08-18 LAB — BASIC METABOLIC PANEL
BUN: 11 mg/dL (ref 4–21)
CREATININE: 0.6 mg/dL (ref <=1.1)
GLUCOSE: 128 mg/dL
POTASSIUM: 4 mmol/L (ref 3.4–5.3)
SODIUM: 137 mmol/L (ref 137–147)

## 2016-08-18 LAB — DIRECT DIALYSIS FREE T4: FREE T4: 1.3

## 2016-08-18 LAB — CALCIUM: Calcium: 9.9 mg/dL

## 2016-08-18 LAB — MAGNESIUM: Magnesium: 1.5

## 2016-08-18 LAB — PHOSPHORUS: Phosphorus: 2.9

## 2016-08-18 LAB — CARBON MONOXIDE, BLOOD: Carbon Dioxide, Total: 29

## 2016-08-18 LAB — ESTIMATED GFR
GFR CALC AF AMER: 120
GFR CALC NON AF AMER: 110

## 2016-08-18 LAB — ALBUMIN: Albumin: 4.2

## 2016-08-18 LAB — HEPATIC FUNCTION PANEL
ALT: 27 U/L (ref 7–35)
AST: 23 U/L (ref 13–35)
Alkaline Phosphatase: 59 U/L (ref 25–125)
BILIRUBIN, TOTAL: 0.4 mg/dL

## 2016-08-18 LAB — PROTEIN AND GLUCOSE, CSF: Protein: 6.8

## 2016-08-18 LAB — CHG T CELL ABSOLUTE COUNT/RATIO: VITAMIN B12: 770

## 2016-08-18 LAB — CBC AND DIFFERENTIAL
Hemoglobin: 12.8 g/dL (ref 12.0–16.0)
NEUTROS ABS: 6902 /uL
PLATELETS: 255 10*3/uL (ref 150–399)
WBC: 20.3 10^3/mL

## 2016-08-18 LAB — TSH: TSH: 2.5 u[IU]/mL (ref <=5.90)

## 2016-08-18 LAB — CHLORIDE: CHLORIDE: 99 mmol/L

## 2016-08-18 LAB — LIPID PANEL
Cholesterol: 199 mg/dL (ref 0–200)
HDL: 49 mg/dL (ref 35–70)
LDL Cholesterol: 109 mg/dL
Triglycerides: 285 mg/dL — AB (ref 40–160)

## 2016-08-18 LAB — VITAMIN D 25 HYDROXY (VIT D DEFICIENCY, FRACTURES): Vit D, 25-Hydroxy: 30

## 2016-08-18 LAB — POCT GLYCOSYLATED HEMOGLOBIN (HGB A1C): Hemoglobin A1C: 7.7

## 2016-08-18 MED ORDER — ESCITALOPRAM OXALATE 10 MG PO TABS: 10.0000 mg | ORAL_TABLET | Freq: Every day | ORAL | 1 refills | Status: DC

## 2016-08-18 MED ORDER — LISINOPRIL-HYDROCHLOROTHIAZIDE 20-25 MG PO TABS: 1.0000 | ORAL_TABLET | Freq: Every day | ORAL | 1 refills | Status: DC

## 2016-08-18 MED ORDER — LEVOTHYROXINE SODIUM 100 MCG PO TABS: 100.0000 ug | ORAL_TABLET | Freq: Every day | ORAL | 0 refills | Status: DC

## 2016-08-18 MED ORDER — METFORMIN HCL ER 500 MG PO TB24: 1000.0000 mg | ORAL_TABLET | Freq: Every day | ORAL | 1 refills | Status: DC

## 2016-08-18 NOTE — Assessment & Plan Note (Addendum)
Sister dx breast CA 49yo Younger sister- dx 42. BRCA / genetic testing negative.   -  Just had her mammogram today:  08/19/16. Always negative prior

## 2016-08-18 NOTE — Assessment & Plan Note (Signed)
Was in [place] - 07/2011- in wheelchair several months.  Spine Sx- in dr Cohen's office.  Tranfoerminal epidural steroid injections Dr.  Maia Petties- txing her- last seen in March.

## 2016-08-18 NOTE — Assessment & Plan Note (Signed)
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  Also at [place] through research program- gets txmnt free.   Was at [facility].  Was at over [place].

## 2016-08-18 NOTE — Progress Notes (Signed)
New patient office visit note:  Impression and Recommendations:    1. Diabetes mellitus without complication (Rachel Simmons)   2. Chronic lymphoblastic leukemia (Rachel Simmons)   3. Essential hypertension   4. Thyroid disease   5. Family history of breast cancer   6. Obesity, Class III, BMI 40-49.9 (morbid obesity) (Rachel Simmons)   7. Family history of colon cancer requiring screening colonoscopy   8. Family history of polyps in the colon   9. Family history of colon cancer   10. Genetic testing   11. 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  Also at [place] through research program- gets txmnt free.   Was at [facility].  Was at over [place].   Family history of breast cancer Sister dx breast CA 20yo Younger sister- dx 58. BRCA / genetic testing negative.   -  Just had her mammogram today:  08/19/16. Always negative prior  Chronic back pain greater than 3 months duration Was in [place] - 07/2011- in wheelchair several months.  Spine Sx- in dr Rachel Simmons's office.  Tranfoerminal epidural steroid injections Dr.  Maia Simmons- txing her- last seen in March.   Genetic testing Had genetic testing consultation and tested negative for the BRCA Gene and several others. See notes from counselor on 6/ 16 /16  Family history of colon cancer- brother age 68 Referral for colonoscopy placed.    She should've had it at age 69 since brother with onset colon cancer age 55.  Adjustment disorder with mixed anxiety and depressed mood Discussion with patient about her emotional distress with life pressures and new diagnosis of CLL. Patient tearful in office today. After discussion, we will start Lexapro.  Diabetes mellitus without complication (Rachel Simmons) Refill metformin given.  - Patient not on a statin medication and should be. Discussed with her briefly. We'll obtain fasting lipid profile and address in near  future at follow-up office visit.  Hypertension Stable well controlled  Refill of lisinopril-hydrochlorothiazide   Thyroid disease Patient needed refill meds-Synthroid given.   We'll obtain blood work near future    Obesity, Class III, BMI 40-49.9 (morbid obesity) (Rachel Simmons) Encouraged to continue her weight loss which she has lost 30 pounds since June!    Continue heart healthy/ Mediterranean type diet.    Congratulated patient on accomplishments and encouraged to walk daily for stress as well as weight management  Pt was in the office today for 40+ minutes, with over 50% time spent in face to face counseling of various medical concerns and in coordination of care  Orders Placed This Encounter  Procedures  . CBC with Differential/Platelet  . COMPLETE METABOLIC PANEL WITH GFR  . Lipid panel  . Magnesium  . Phosphorus  . T4, free  . TSH  . Microalbumin / creatinine urine ratio  . VITAMIN D 25 Hydroxy (Vit-D Deficiency, Fractures)  . Vitamin B12  . Ambulatory referral to Gastroenterology  . POCT glycosylated hemoglobin (Hb A1C)      New Prescriptions   METFORMIN (GLUCOPHAGE XR) 500 MG 24 HR TABLET    Take 2 tablets (1,000 mg total) by mouth daily with breakfast.    Modified Medications   Modified Medication Previous Medication   ESCITALOPRAM (LEXAPRO) 10 MG TABLET escitalopram (LEXAPRO) 10 MG tablet      Take 1 tablet (10 mg total) by mouth daily.    Take 1  tablet by mouth daily.   LEVOTHYROXINE (SYNTHROID, LEVOTHROID) 100 MCG TABLET levothyroxine (SYNTHROID, LEVOTHROID) 100 MCG tablet      Take 1 tablet (100 mcg total) by mouth daily.    Take 1 tablet by mouth daily.   LISINOPRIL-HYDROCHLOROTHIAZIDE (PRINZIDE,ZESTORETIC) 20-25 MG TABLET lisinopril-hydrochlorothiazide (PRINZIDE,ZESTORETIC) 20-25 MG tablet      Take 1 tablet by mouth daily.    Take 1 tablet by mouth daily.    Discontinued Medications   METFORMIN (GLUCOPHAGE) 500 MG TABLET    Take 1 tablet by mouth  daily.     The patient was counseled, risk factors were discussed, anticipatory guidance given.  Gross side effects, risk and benefits, and alternatives of medications discussed with patient.  Patient is aware that all medications have potential side effects and we are unable to predict every side effect or drug-drug interaction that may occur.  Expresses verbal understanding and consents to current therapy plan and treatment regimen.  Return for Fasting blood work- near future and then OV with me to diccuss.  Please see AVS handed out to patient at the end of our visit for further patient instructions/ counseling done pertaining to today's office visit.    Note: This document was prepared using Dragon voice recognition software and may include unintentional dictation errors.  ----------------------------------------------------------------------------------------------------------------------    Subjective:    Chief Complaint  Patient presents with  . Establish Care    HPI: Rachel Simmons is a pleasant 50 y.o. female who presents to [place] [facility] today to review their medical history with me and establish care.    - Used to go to Dr Lovette Cliche- [facility]-   Widowed- year 2013- It's been very traumatic for both her and her family. She and her daughter drove home from shopping and found him dead in the driveway of a massive heart attack. She performed CPR on him until the ambulance arrived.     Now with her first BF ever- now with Rachel Simmons- reconnected 32 yrs later.   Patient was Born and raised in [place]--> went to [employer] of [place].   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   One of 10 children- has 9 Brothers/ sisters.  Lives with her daughter Rachel Simmons, 72  who is also a patient of mine. They have  6 dogs.  Her older son Rachel Simmons, 81 years old, also a patient of mine recently diagnosed with type 1 diabetes  Works at [employer].     She has had DM- for 3 yrs now- type II.   Usually runs - A1c 10.0.   Now today at 7.7 and patient is estatic/ overly joyed about that.   She lost 30lbs since June- when her daughter Rachel Simmons first came to see me.  They live together and are both eating healthier and also a healthy change was prompted by her son's Rachel Simmons's new Dx of Type I DM- which I recently diagnosed about one month ago.  GYN- last Exam- May 2016.   mammo- today she went  Family H/o colon CA- 49 yo Female- brother dx and Mom with polyps- dx in 47's.  From Up-to-Date:  People who have one first-degree relative (parent, brother, sister, or child) with colorectal cancer or adenomatous polyps  at a young age (before the age of 11 years), or two first-degree relatives diagnosed at any age, should begin screening for colon cancer earlier, typically at age 43, or 40 years younger than the earliest diagnosis in their family, whichever comes first. Screening usually involves colonoscopy every five years.    Wt Readings from Last 3 Encounters:  08/18/16 (!) 301 lb 11.2 oz (136.9 kg)   BP Readings from Last 3 Encounters:  08/18/16 109/78   Pulse Readings from Last 3 Encounters:  08/18/16 89   BMI Readings from Last 3 Encounters:  08/18/16 45.54 kg/m      Patient Active Problem List   Diagnosis Date Noted  . Diabetes mellitus without complication ([place]) 78/29/5621    Priority: High  . Hypertension 08/18/2016    Priority: High  . Thyroid disease 08/18/2016    Priority: High  . Family history of colon cancer requiring screening colonoscopy 08/20/2016  . Obesity, Class III, BMI 40-49.9 (morbid obesity) ([place]) 08/20/2016  . Adjustment disorder with mixed anxiety and depressed mood 08/20/2016  . Chronic lymphoblastic leukemia ([place]) 08/18/2016  . Chronic  back pain greater than 3 months duration 08/18/2016  . Genetic testing 05/21/2015  . Family history of breast cancer   . Family history of colon cancer- brother age 52   . Family history of polyps in the colon      Past Medical History:  Diagnosis Date  . Chronic lymphoblastic leukemia ([place])   . Diabetes mellitus without complication ([place])   . Family history of breast cancer   . Family history of colon cancer   . Family history of polyps in the colon   . Hypertension   . Thyroid disease      Past Medical History:  Diagnosis Date  . Chronic lymphoblastic leukemia ([place])   . Diabetes mellitus without complication ([place])   . Family history of breast cancer   . Family history of colon cancer   . Family history of polyps in the colon   . Hypertension   . Thyroid disease      Past Surgical History:  Procedure Laterality Date  . ABDOMINAL HYSTERECTOMY  2006   reports ovaries and cervix are intact  . TONSILLECTOMY       Family History  Problem Relation Age of Onset  . Breast cancer Mother     66s; deceased 15  . Colon polyps Mother     Dx 65s; #/type unknown  . Cancer Father     lung cancer; smoker; deceased 82  . Breast cancer Sister 37    triple negative; currently 13  . Cancer Maternal Aunt     lung cancer; smoker; currently 24  . Colon cancer Maternal Uncle     Dx 34s; currently 101  . Breast cancer Paternal Aunt     Dx 61s; currently 43s  . Cancer Paternal Uncle     liver; heavy drinker  . Cancer Maternal Grandfather     unk. primary; deceased 21s  . Cancer Paternal Grandmother     Dx 33s; unknown primary  . Breast cancer Sister 6    currently 27  . Uterine cancer Sister 25    currently 54  . Cancer Paternal Uncle     lung; heavy smoker  . Breast cancer Cousin     mat first cousin through aunt; poss. breast ca; had mastectomy; currently 50  . Ovarian cancer Other     mat grandmother's sister  . Cancer Other  stomach cancer; mat grandmother's  sister  . Colon polyps Brother     age, #, type unknown     History  Drug Use No    History  Alcohol Use No    History  Smoking Status  . Never Smoker  Smokeless Tobacco  . Never Used     Patient's Medications  New Prescriptions   METFORMIN (GLUCOPHAGE XR) 500 MG 24 HR TABLET    Take 2 tablets (1,000 mg total) by mouth daily with breakfast.  Previous Medications   CALCIUM CARBONATE (CALCIUM 600) 600 MG TABS TABLET    Take 1 tablet by mouth daily.   CETIRIZINE (ZYRTEC) 5 MG TABLET    Take 1 tablet by mouth daily.   ERGOCALCIFEROL (VITAMIN D2) 400 UNITS TABS    Take 1 tablet by mouth daily.   GABAPENTIN (NEURONTIN) 300 MG CAPSULE    Take 300 mg by mouth 2 (two) times daily.   MULTIPLE VITAMIN (MULTI-VITAMINS) TABS    Take 1 tablet by mouth daily.   OMEGA-3 FATTY ACIDS (FISH OIL CONCENTRATE) 300 MG CAPS    Take 1 capsule by mouth daily.  Modified Medications   Modified Medication Previous Medication   ESCITALOPRAM (LEXAPRO) 10 MG TABLET escitalopram (LEXAPRO) 10 MG tablet      Take 1 tablet (10 mg total) by mouth daily.    Take 1 tablet by mouth daily.   LEVOTHYROXINE (SYNTHROID, LEVOTHROID) 100 MCG TABLET levothyroxine (SYNTHROID, LEVOTHROID) 100 MCG tablet      Take 1 tablet (100 mcg total) by mouth daily.    Take 1 tablet by mouth daily.   LISINOPRIL-HYDROCHLOROTHIAZIDE (PRINZIDE,ZESTORETIC) 20-25 MG TABLET lisinopril-hydrochlorothiazide (PRINZIDE,ZESTORETIC) 20-25 MG tablet      Take 1 tablet by mouth daily.    Take 1 tablet by mouth daily.  Discontinued Medications   METFORMIN (GLUCOPHAGE) 500 MG TABLET    Take 1 tablet by mouth daily.    Allergies: Review of patient's allergies indicates no known allergies.  Review of Systems  Constitutional: Negative.   HENT: Negative for congestion, hearing loss, nosebleeds, sore throat and tinnitus.   Eyes: Negative.   Respiratory: Negative.   Cardiovascular: Negative.   Gastrointestinal: Negative.   Genitourinary:  Negative.        Nocturia   Musculoskeletal: Negative.   Skin: Negative.   Neurological: Positive for headaches. Negative for dizziness, sensory change, focal weakness and loss of consciousness.  Endo/Heme/Allergies: Bruises/bleeds easily.  Psychiatric/Behavioral: Negative for depression, memory loss, substance abuse and suicidal ideas. The patient has insomnia. The patient is not nervous/anxious.        Anxiety      Objective:    Blood pressure 109/78, pulse 89, height 5' 8.25" (1.734 m), weight (!) 301 lb 11.2 oz (136.9 kg). Body mass index is 45.54 kg/m. General: Well Developed, well nourished, and in no acute distress.  Neuro: Alert and oriented x3, extra-ocular muscles intact, sensation grossly intact.  HEENT: Normocephalic, atraumatic, pupils equal round reactive to light, neck supple, no gross masses, no carotid bruits, no JVD apprec Skin: no gross suspicious lesions or rashes  Cardiac: Regular rate and rhythm, no murmurs rubs or gallops.  Respiratory: Essentially clear to auscultation bilaterally. Not using accessory muscles, speaking in full sentences.  Abdominal: Soft, not grossly distended Musculoskeletal: Ambulates w/o diff, FROM * 4 ext.  Vasc: less 2 sec cap RF, warm and pink  Psych:  No HI/SI, judgement and insight good, Euthymic mood. Full Affect.

## 2016-08-18 NOTE — Patient Instructions (Addendum)
www.heart.org- search Med diet.    -  AHA guidelines for exercise of 150 minutes of moderate intensity aerobic activity per week discussed and encouraged.   Discussed how regular exercise will improve brain function and memory, as well as improve mood, boost immune system and help with weight management, among the other, more well-known effects of exercise such as decreasing risk for hypertension, diabetes, hyperlipidemia etc.  -  The AHA strongly endorses consumption of a diet that contains a variety of foods from all the food categories with an emphasis on fruits and vegetables; fat-free and low-fat dairy products; cereal and grain products; legumes and nuts; and fish, poultry, and or lean meats.   Excessive food intake, especially of foods high in saturated and trans fats, sugar, and salt, should be avoided  Top Ten Foods for Health  1. Water Drink at least 8 to 12 cups of water daily. Consume half of your body weight in pounds, is the amount of water in ounces to drink daily.  Ie: a 200lb person = 100 oz water daily  2. Dark Green Vegetables Eat dark green vegetables at least three to four times a week. Good options include broccoli, peppers, brussel sprouts and leafy greens like kale and spinach.  3. Whole Grains Whole grains should be included in your diet at least two to three times daily. Look for whole wheat flour, rye, oatmeal, barley, amaranth, quinoa or a multigrain. A good source of fiber includes 3 to 4 grams of fiber per serving. A great source has 5 or more grams of fiber per serving.  4. Beans and Lentils Try to eat a bean-based meal at least once a week. Try to add legumes, including beans and lentils, to soups, stews, casseroles, salads and dips or eat them plain.  5. Fish Try to eat two to three serving of fish a week. A serving consists of 3 to 4 ounces of cooked fish. Good choices are salmon, trout, herring, bluefish, sardines and tuna.  6. Berries Include two to four  servings of fruit in your diet each day. Try to eat berries such as raspberries, blueberries, blackberries and strawberries.  7. Winter Squash Eat butternut and acorn squash as well as other richly pigmented dark orange and green colored vegetables like sweet potato, cantaloupe and mango.  8. Soy 25 grams of soy protein a day is recommended as part of a low-fat diet to help lower cholesterol levels. Try tofu, soymilk, edamame soybeans, tempeh and texturized vegetable protein (TVP).  9. Flaxseed, Nuts and Seeds Add 1 to 2 tablespoons of ground flaxseed or other seeds to food each day or include a moderate amount of nuts - 1/4 cup - in your daily diet.  10. Organic Yogurt Men and women between 45 and 43 years of age need 1000 milligrams of calcium a day and 1200 milligrams if 32 or older. Eat calcium-rich foods such as nonfat or low-fat dairy products three to four times a day. Include organic choices.

## 2016-08-19 LAB — MICROALBUMIN / CREATININE URINE RATIO
Creatinine, Urine: 105 mg/dL (ref 20–320)
MICROALB/CREAT RATIO: 5 ug/mg{creat} (ref <30)
Microalb, Ur: 0.5 mg/dL

## 2016-08-20 DIAGNOSIS — Z800 Family history of malignant neoplasm of digestive organs: Secondary | ICD-10-CM | POA: Insufficient documentation

## 2016-08-20 DIAGNOSIS — F4323 Adjustment disorder with mixed anxiety and depressed mood: Secondary | ICD-10-CM | POA: Insufficient documentation

## 2016-08-20 DIAGNOSIS — F32A Depression, unspecified: Secondary | ICD-10-CM | POA: Insufficient documentation

## 2016-08-20 NOTE — Assessment & Plan Note (Signed)
>>  ASSESSMENT AND PLAN FOR OBESITY, CLASS III, BMI 40-49.9 (MORBID OBESITY) (HCC) WRITTEN ON 08/20/2016  1:02 PM BY OPALSKI, DEBORAH, DO  Encouraged to continue her weight loss which she has lost 30 pounds since June!    Continue heart healthy/ Mediterranean type diet.    Congratulated patient on accomplishments and encouraged to walk daily for stress as well as weight management

## 2016-08-20 NOTE — Assessment & Plan Note (Signed)
>>  ASSESSMENT AND PLAN FOR THYROID DISEASE WRITTEN ON 08/20/2016  1:00 PM BY OPALSKI, [place], DO  Patient needed refill meds-Synthroid given.   We'll obtain blood work near future

## 2016-08-20 NOTE — Assessment & Plan Note (Signed)
Had genetic testing consultation and tested negative for the BRCA Gene and several others. See notes from counselor on 6/ 16 /16 

## 2016-08-20 NOTE — Assessment & Plan Note (Signed)
Refill metformin given.  - Patient not on a statin medication and should be. Discussed with her briefly. We'll obtain fasting lipid profile and address in near future at follow-up office visit.

## 2016-08-20 NOTE — Assessment & Plan Note (Signed)
Stable well controlled  Refill of lisinopril-hydrochlorothiazide

## 2016-08-20 NOTE — Assessment & Plan Note (Signed)
Encouraged to continue her weight loss which she has lost 30 pounds since June!    Continue heart healthy/ Mediterranean type diet.    Congratulated patient on accomplishments and encouraged to walk daily for stress as well as weight management

## 2016-08-20 NOTE — Assessment & Plan Note (Addendum)
Referral for colonoscopy placed.    She should've had it at age 50 since brother with onset colon cancer age 35.

## 2016-08-20 NOTE — Assessment & Plan Note (Signed)
Discussion with patient about her emotional distress with life pressures and new diagnosis of CLL. Patient tearful in office today. After discussion, we will start Lexapro.

## 2016-08-20 NOTE — Assessment & Plan Note (Signed)
Patient needed refill meds-Synthroid given.   We'll obtain blood work near future

## 2016-08-22 ENCOUNTER — Encounter: Payer: Self-pay | Admitting: Internal Medicine

## 2016-08-25 NOTE — Progress Notes (Signed)
f °

## 2016-08-30 ENCOUNTER — Telehealth: Payer: Self-pay | Admitting: Family Medicine

## 2016-08-30 NOTE — Telephone Encounter (Signed)
Patient complaining of rash on her face. She went to a walk in clinic by her work yesterday and they gave her a topical cream. Patient says the cream isnt helping and the rash is slowly spreading and wants to know if Dr. Jenetta Downer needs to see her or if can get a prescription of something to help. The schedule is pretty booked up the next few days but I can fit her in if we need to. Please advise thanks!

## 2016-08-30 NOTE — Telephone Encounter (Signed)
Pt will have to be seen for evaluation.  Rashes are almost impossible to diagnosis without actually seeing the rash.  Please call patient and schedule OV.  Thanks!!

## 2016-09-01 ENCOUNTER — Ambulatory Visit: Admitting: Family Medicine

## 2016-09-05 ENCOUNTER — Ambulatory Visit: Admitting: Internal Medicine

## 2016-09-21 ENCOUNTER — Encounter: Admitting: Family Medicine

## 2016-09-21 ENCOUNTER — Other Ambulatory Visit: Payer: Self-pay

## 2016-09-21 MED ORDER — METFORMIN HCL ER 500 MG PO TB24: 1000.0000 mg | ORAL_TABLET | Freq: Every day | ORAL | 1 refills | Status: DC

## 2016-09-21 MED ORDER — ESCITALOPRAM OXALATE 10 MG PO TABS: 10.0000 mg | ORAL_TABLET | Freq: Every day | ORAL | 1 refills | Status: DC

## 2016-09-21 MED ORDER — LEVOTHYROXINE SODIUM 100 MCG PO TABS: 100.0000 ug | ORAL_TABLET | Freq: Every day | ORAL | 0 refills | Status: DC

## 2016-09-21 MED ORDER — LISINOPRIL-HYDROCHLOROTHIAZIDE 20-25 MG PO TABS: 1.0000 | ORAL_TABLET | Freq: Every day | ORAL | 1 refills | Status: DC

## 2016-09-21 NOTE — Telephone Encounter (Signed)
Received fax from pt stating that [facility] did not receive last refills on lexapro, levothyroxine, metformin and lisinopril.  In reviewing the chart, RXs were sent to [facility] instead of [facility].  RXS sent to [facility] today.  LVM informing pt.  Charyl Bigger, CMA

## 2016-10-18 ENCOUNTER — Encounter: Payer: Self-pay | Admitting: Family Medicine

## 2016-10-18 ENCOUNTER — Ambulatory Visit (INDEPENDENT_AMBULATORY_CARE_PROVIDER_SITE_OTHER): Admitting: Family Medicine

## 2016-10-18 VITALS — BP 117/84 | HR 84 | Ht 68.25 in | Wt 310.6 lb

## 2016-10-18 DIAGNOSIS — M549 Dorsalgia, unspecified: Secondary | ICD-10-CM

## 2016-10-18 DIAGNOSIS — C91Z0 Other lymphoid leukemia not having achieved remission: Secondary | ICD-10-CM

## 2016-10-18 DIAGNOSIS — E079 Disorder of thyroid, unspecified: Secondary | ICD-10-CM | POA: Diagnosis not present

## 2016-10-18 DIAGNOSIS — I10 Essential (primary) hypertension: Secondary | ICD-10-CM | POA: Diagnosis not present

## 2016-10-18 DIAGNOSIS — IMO0001 Reserved for inherently not codable concepts without codable children: Secondary | ICD-10-CM

## 2016-10-18 DIAGNOSIS — F4323 Adjustment disorder with mixed anxiety and depressed mood: Secondary | ICD-10-CM

## 2016-10-18 DIAGNOSIS — Z803 Family history of malignant neoplasm of breast: Secondary | ICD-10-CM

## 2016-10-18 DIAGNOSIS — Z114 Encounter for screening for human immunodeficiency virus [HIV]: Secondary | ICD-10-CM | POA: Diagnosis not present

## 2016-10-18 DIAGNOSIS — Z23 Encounter for immunization: Secondary | ICD-10-CM

## 2016-10-18 DIAGNOSIS — D72829 Elevated white blood cell count, unspecified: Secondary | ICD-10-CM | POA: Insufficient documentation

## 2016-10-18 DIAGNOSIS — E66813 Obesity, class 3: Secondary | ICD-10-CM

## 2016-10-18 DIAGNOSIS — G8929 Other chronic pain: Secondary | ICD-10-CM

## 2016-10-18 DIAGNOSIS — E781 Pure hyperglyceridemia: Secondary | ICD-10-CM

## 2016-10-18 DIAGNOSIS — E1169 Type 2 diabetes mellitus with other specified complication: Secondary | ICD-10-CM | POA: Insufficient documentation

## 2016-10-18 DIAGNOSIS — E119 Type 2 diabetes mellitus without complications: Secondary | ICD-10-CM

## 2016-10-18 DIAGNOSIS — C9110 Chronic lymphocytic leukemia of B-cell type not having achieved remission: Secondary | ICD-10-CM

## 2016-10-18 DIAGNOSIS — F5102 Adjustment insomnia: Secondary | ICD-10-CM

## 2016-10-18 DIAGNOSIS — D72823 Leukemoid reaction: Secondary | ICD-10-CM

## 2016-10-18 DIAGNOSIS — G4700 Insomnia, unspecified: Secondary | ICD-10-CM | POA: Insufficient documentation

## 2016-10-18 MED ORDER — FISH OIL 1200 MG PO CAPS: ORAL_CAPSULE | ORAL | Status: DC

## 2016-10-18 MED ORDER — ESCITALOPRAM OXALATE 20 MG PO TABS: 20.0000 mg | ORAL_TABLET | Freq: Every day | ORAL | 1 refills | Status: DC

## 2016-10-18 MED ORDER — VITAMIN D3 125 MCG (5000 UT) PO TABS: ORAL_TABLET | ORAL | Status: DC

## 2016-10-18 MED ORDER — TRAZODONE HCL 100 MG PO TABS: ORAL_TABLET | ORAL | 1 refills | Status: DC

## 2016-10-18 MED ORDER — VITAMIN D (ERGOCALCIFEROL) 1.25 MG (50000 UNIT) PO CAPS: 50000.0000 [IU] | ORAL_CAPSULE | ORAL | 20 refills | Status: DC

## 2016-10-18 MED ORDER — ATORVASTATIN CALCIUM 20 MG PO TABS: 20.0000 mg | ORAL_TABLET | Freq: Every day | ORAL | 3 refills | Status: DC

## 2016-10-18 NOTE — Assessment & Plan Note (Signed)
Fish oil at higher doses d/c pt.  Diet and exercise encouraged

## 2016-10-18 NOTE — Assessment & Plan Note (Signed)
Lifestyle changes such as dash diet and engaging in a regular exercise program discussed with patient.  Educational handouts provided  Ambulatory BP monitoring encouraged. Keep log and bring in next OV  Continue current medication(s).   Also, risks and benefits of medications discussed with patient, including alternative treatments.   Encouraged patient to read drug information handouts to further educate self about the medicine prior to starting it.   Contact us prior with any Q's/ concerns. 

## 2016-10-18 NOTE — Assessment & Plan Note (Addendum)
Metformin, on lisinopril, and statin  - Counseled patient on pathophysiology of disease and discussed various treatment options, which often includes dietary and lifestyle modifications as first line.  Importance of low carb/ketogenic diet discussed with patient in addition to regular exercise.   - Check FBS and 2 hours after the biggest meal of your day.  Keep log and bring in next OV for my review.    For your blood sugars-please track your fasting and 2 hour postprandial especially after you eat good or especially after you eat poorly.   - Also, if you ever feel poorly, please check your blood pressure and blood sugar, as one or the other could be the cause of your symptoms.  - Being a diabetic, you need yearly eye and foot exams. Make appt.for diabetic eye exam    - Continue current medication(s).     Also, risks and benefits of medications discussed with patient, including alternative treatments.   Encouraged patient to read drug information handouts to further educate self about the medicine prior to starting it. Contact us prior with any questions/ concerns.

## 2016-10-18 NOTE — Patient Instructions (Addendum)
For the trazodone, please start with a half a tablet about 45 minutes before you want to fall sleep. If you sleep okay but not great, the next night start with one full tablet. You can even go up to 2 tablets nightly.    Blood sugars-please track your fasting and 2 hour postprandial especially after you eat good or especially after you eat poorly.     If you have insomnia or difficulty sleeping, this information is for you:  - Avoid caffeinated beverages after lunch,  no alcoholic beverages,  no eating within 2-3 hours of lying down,  avoid exposure to blue light before bed,  avoid daytime naps, and  needs to maintain a regular sleep schedule- go to sleep and wake up around the same time every night.   - Resolve concerns or worries before entering bedroom:  Discussed relaxation techniques with patient and to keep a journal to write down fears\ worries.  I suggested seeing a counselor for CBT.   - Recommend patient meditate or do deep breathing exercises to help relax.   Incorporate the use of white noise machines or listen to "sleep meditation music", or recordings of guided meditations for sleep from YouTube which are free, such as  "guided meditation for detachment from over thinking"  by Mayford Knife.      Lose it app or [employer]--- how you track foods.

## 2016-10-18 NOTE — Progress Notes (Signed)
This is pt's second OV with me--> last seen 08/18/16   Impression and Recommendations:    1. Need for prophylactic vaccination against Streptococcus pneumoniae (pneumococcus)   2. Screening for HIV (human immunodeficiency virus)   3. Thyroid disease   4. Essential hypertension   5. diet controlled Diabetes mellitus without complication (HCC)   6. Obesity, Class III, BMI 40-49.9 (morbid obesity) ([place])   7. Adjustment disorder with mixed anxiety and depressed mood   8. Chronic lymphocytic leukemia ([place])   9. Hypertriglyceridemia   10. Adjustment insomnia   11. 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 Leukemoid reaction   13. Family history of breast cancer   14. Chronic back pain greater than 3 months duration    Adjustment disorder with mixed anxiety and depressed mood Doing well on Lexapro.  Feels she needs a little higher dose though. I explained can take 6-8 wks to reach full effect  - rec counselor---> gave her names of some to contact.  Type II Diabetes mellitus without complication (HCC) Metformin, on lisinopril, and statin  - Counseled patient on pathophysiology of disease and discussed various treatment options, which often includes dietary and lifestyle modifications as first line.  Importance of low carb/ketogenic diet discussed with patient in addition to regular exercise.   - Check FBS and 2 hours after the biggest meal of your day.  Keep log and bring in next OV for my review.    For your blood sugars-please track your fasting and 2 hour postprandial especially after you eat good or especially after you eat poorly.   - Also, if you ever feel poorly, please check your blood pressure and blood sugar, as one or the other could be the cause of your symptoms.  - Being a diabetic, you need yearly eye and foot exams. Make appt.for diabetic eye exam    - Continue current medication(s).     Also, risks and benefits of medications discussed with patient, including  alternative treatments.   Encouraged patient to read drug information handouts to further educate self about the medicine prior to starting it. Contact us prior with any questions/ concerns.  Hypertension Lifestyle changes such as dash diet and engaging in a regular exercise program discussed with patient.  Educational handouts provided  Ambulatory BP monitoring encouraged. Keep log and bring in next OV  Continue current medication(s).   Also, risks and benefits of medications discussed with patient, including alternative treatments.   Encouraged patient to read drug information handouts to further educate self about the medicine prior to starting it.   Contact us prior with any Q's/ concerns.  Obesity, Class III, BMI 40-49.9 (morbid obesity) ([place]) Has gained since last OV due to "start of the holiday season"  Counseling done- advised wt loss.  Use lose it or my fitness pal---> track all food and drinks or GO TO [employer] meetings.   Discussed with patient importance of weight loss to help achieve health goals and how increasing weight, correlates to increasing risk of disease. (or increasing risk of not controlling existing diseases.)  Insomnia The problem of recurrent insomnia is discussed. Sleep hygiene issues are reviewed.   Avoid caffeinated beverages after lunch,  no alcoholic beverages,  no eating within 2-3 hours of lying down,  avoid exposure to blue light before bed,  avoid daytime naps, and  needs to maintain a regular sleep schedule- go to sleep and wake up around the same time every night.   R/B meds  d/c pt- Trazodone started. For the trazodone, please start with a half a tablet about 45 minutes before you want to fall sleep. If you sleep okay but not great, the next night start with one full tablet. You can even go up to 2 tablets nightly.  - Resolve concerns or worries before entering bedroom:  Discussed relaxation techniques with patient and to keep a journal to write down fears_0  worries.  I suggested seeing a counselor for CBT.   - Recommend patient meditate or do deep breathing exercises to help relax.   Incorporate the use of white noise machines or listen to "sleep meditation music", or recordings of guided meditations for sleep from YouTube which are free, such as  "guided meditation for detachment from over thinking"  by Mayford Knife.     Hypertriglyceridemia Fish oil at higher doses d/c pt.  Diet and exercise encouraged  Pt was in the office today for 50+ minutes, with over 50% time spent in face to face counseling of various medical concerns and in coordination of care  New Prescriptions   ATORVASTATIN (LIPITOR) 20 MG TABLET    Take 1 tablet (20 mg total) by mouth at bedtime.   CHOLECALCIFEROL (VITAMIN D3) 5000 UNITS TABS    5,000 IU OTC vitamin D3 daily.   OMEGA-3 FATTY ACIDS (FISH OIL) 1200 MG CAPS    Take 4-5 tabs daily   TRAZODONE (DESYREL) 100 MG TABLET    1 tabs nightly   VITAMIN D, ERGOCALCIFEROL, (DRISDOL) 50000 UNITS CAPS CAPSULE    Take 1 capsule (50,000 Units total) by mouth every 7 (seven) days.   Modified Medications   Modified Medication Previous Medication   ESCITALOPRAM (LEXAPRO) 20 MG TABLET escitalopram (LEXAPRO) 10 MG tablet      Take 1 tablet (20 mg total) by mouth daily.    Take 1 tablet (10 mg total) by mouth daily.   Discontinued Medications   OMEGA-3 FATTY ACIDS (FISH OIL CONCENTRATE) 300 MG CAPS    Take 1 capsule by mouth daily.   F-up preventative CPE in 3-4 wks. F/up sooner for chronic care management as discussed and/or prn.  Please see orders placed and AVS handed out to patient at the end of our visit for further patient instructions/ counseling done pertaining to today's office visit.     Subjective:    Chief Complaint  Patient presents with  . Annual Exam    HPI: Rachel Simmons is a 50 y.o. female who presents to [place] at [facility] today a yearly health maintenance exam- but has a lot of  issues to discuss; labs to review and modifications of meds etc.   This is ONLY pt's second OV with me--> last seen 08/18/16  We will forego the CPE today and address chronic issues. Last OV we restarted meds, started new ones etc  All recent blood work that we ordered was reviewed with patient today.  Patient was counseled on all abnormalities and we discussed dietary and lifestyle changes that could help those values (also medications when appropriate).  Extensive health counseling performed and all patient's concerns/ questions were addressed.   DM:  Restarted metformin last OV- but she got it at the end [place].   A1c was 7.7--> so that was prior to being on the DM meds.   She has not been checking BS much; tol well, no S-E.   No exercise.  Diet- is just ok  Obese:  Has gained since last OV.  Not tracking foods  HTN- well controlled.   CHol:  Never has been on statin in past.    Mood:  Short tempered.  More easily aggravated/ irritable.    Only sleeps 3-4 hrs/ night for one yr or so---> can't shut down her mind. Trouble falling and staying asleep- even since husband died2013/02/20.    Aspirin: administering 81 mg daily Colonoscopy:     (Unnecessary secondary to < 42 or > 68 years old.) Tdap: Up to date Pneumovax/PPSV23:  Up to date: see Immunizations. Prevnar 13/PCV13:    UTD: needs  Zostavax:    Postponed. CT scan for screening lung CA:   n/a Abdominal Ultrasound:     ( Unnecessary secondary to < 59 or > 52 years old) Alcohol:    No concerns, no excessive use Exercise Habits:   Not meeting AHA guidelines STD concerns:   none Drug Use:   None Birth control method:   n/a Menses regular:     n/a Lumps or breast concerns:      no Breast Cancer Family History:      No Bone/ DEXA scan:   ( Unnecessary due to < 65 and average risk)     Wt Readings from Last 3 Encounters:  10/18/16 (!) 310 lb 9.6 oz (140.9 kg)  08/18/16 (!) 301 lb 11.2 oz (136.9 kg)   BP Readings from Last 3 Encounters:    10/18/16 117/84  08/18/16 109/78   Pulse Readings from Last 3 Encounters:  10/18/16 84  08/18/16 89     Past Medical History:  Diagnosis Date  . Chronic lymphoblastic leukemia ([place])   . Diabetes mellitus without complication ([place])   . Family history of breast cancer   . Family history of colon cancer   . Family history of polyps in the colon   . Hypertension   . Thyroid disease       Past Surgical History:  Procedure Laterality Date  . ABDOMINAL HYSTERECTOMY  01/24/05   reports ovaries and cervix are intact  . TONSILLECTOMY        Family History  Problem Relation Age of Onset  . Breast cancer Mother     47s; deceased 45  . Colon polyps Mother     Dx 20s; #/type unknown  . Cancer Father     lung cancer; smoker; deceased 34  . Breast cancer Sister 34    triple negative; currently 33  . Cancer Maternal Aunt     lung cancer; smoker; currently 30  . Colon cancer Maternal Uncle     Dx 67s; currently 6  . Breast cancer Paternal Aunt     Dx 14s; currently 1s  . Cancer Paternal Uncle     liver; heavy drinker  . Cancer Maternal Grandfather     unk. primary; deceased 36s  . Cancer Paternal Grandmother     Dx 100s; unknown primary  . Breast cancer Sister 44    currently 19  . Uterine cancer Sister 60    currently 27  . Cancer Paternal Uncle     lung; heavy smoker  . Breast cancer Cousin     mat first cousin through aunt; poss. breast ca; had mastectomy; currently 50  . Ovarian cancer Other     mat grandmother's sister  . Cancer Other     stomach cancer; mat grandmother's sister  . Colon polyps Brother     age, #, type unknown      History  Drug Use  No  ,   History  Alcohol Use No  ,   History  Smoking Status  . Never Smoker  Smokeless Tobacco  . Never Used  ,   History  Sexual Activity  . Sexual activity: Yes  . Birth control/ protection: None    Current Outpatient Prescriptions on File Prior to Visit  Medication Sig Dispense Refill  .  calcium carbonate (CALCIUM 600) 600 MG TABS tablet Take 1 tablet by mouth daily.    . cetirizine (ZYRTEC) 5 MG tablet Take 1 tablet by mouth daily.    . Ergocalciferol (VITAMIN D2) 400 units TABS Take 1 tablet by mouth daily.    Marland Kitchen gabapentin (NEURONTIN) 300 MG capsule Take 300 mg by mouth 2 (two) times daily.    Marland Kitchen levothyroxine (SYNTHROID, LEVOTHROID) 100 MCG tablet Take 1 tablet (100 mcg total) by mouth daily. 90 tablet 0  . lisinopril-hydrochlorothiazide (PRINZIDE,ZESTORETIC) 20-25 MG tablet Take 1 tablet by mouth daily. 90 tablet 1  . metFORMIN (GLUCOPHAGE XR) 500 MG 24 hr tablet Take 2 tablets (1,000 mg total) by mouth daily with breakfast. 180 tablet 1  . Multiple Vitamin (MULTI-VITAMINS) TABS Take 1 tablet by mouth daily.     No current facility-administered medications on file prior to visit.     Allergies: Patient has no known allergies.  Review of Systems  Constitutional: Negative.  Negative for chills, diaphoresis, fever, malaise/fatigue and weight loss.  HENT: Negative.  Negative for congestion, sore throat and tinnitus.   Eyes: Negative.  Negative for blurred vision, double vision and photophobia.  Respiratory: Negative.  Negative for cough and wheezing.   Cardiovascular: Negative.  Negative for chest pain and palpitations.  Gastrointestinal: Negative.  Negative for blood in stool, diarrhea, nausea and vomiting.  Genitourinary: Negative.  Negative for dysuria, frequency and urgency.  Musculoskeletal: Negative.  Negative for joint pain and myalgias.  Skin: Negative.  Negative for itching and rash.  Neurological: Negative.  Negative for dizziness, focal weakness, weakness and headaches.  Endo/Heme/Allergies: Negative.  Negative for environmental allergies and polydipsia. Does not bruise/bleed easily.  Psychiatric/Behavioral: Negative.  Negative for depression and memory loss. The patient is not nervous/anxious and does not have insomnia.      Objective:    Blood pressure  117/84, pulse 84, height 5' 8.25" (1.734 m), weight (!) 310 lb 9.6 oz (140.9 kg). Body mass index is 46.88 kg/m.  General: Well nourished, in no apparent distress. Eyes: EOMI ENT/Mouth: Hearing appears normal.  Mucus Membranes Moist  Neck: Supple, no masses Resp: Respiratory effort- normal, Distant but ECTA B/L w/o W/R/R  Cardio: distant but RRR w/o MRGs. Abdomen: no gross distention. Lymphatics:  Brisk peripheral pulses, less 2 sec cap RF M-sk: Full ROM, 5/5 strength grossly, normal gait.  Skin: Warm, dry without gross rashes, lesions or ecchymosis.  Neuro: Alert, Oriented Psych: Normal affect, Insight and Judgment appropriate.    Recent Results (from the past 2160 hour(s))  CBC and differential     Status: None   Collection Time: 08/18/16 12:00 AM  Result Value Ref Range   Hemoglobin 12.8 12.0 - 16.0 g/dL   Neutrophils Absolute 6,902 /L   Platelets 255 150 - 399 K/L   WBC 20.3 16^1/WR  Basic metabolic panel     Status: None   Collection Time: 08/18/16 12:00 AM  Result Value Ref Range   Glucose 128 mg/dL   BUN 11 4 - 21 mg/dL   Creatinine 0.6 0.5 - 1.1 mg/dL   Potassium 4.0 3.4 -  5.3 mmol/L   Sodium 137 137 - 147 mmol/L  Lipid panel     Status: Abnormal   Collection Time: 08/18/16 12:00 AM  Result Value Ref Range   Triglycerides 285 (A) 40 - 160 mg/dL   Cholesterol 199 0 - 200 mg/dL   HDL 49 35 - 70 mg/dL   LDL Cholesterol 109 mg/dL  Hepatic function panel     Status: None   Collection Time: 08/18/16 12:00 AM  Result Value Ref Range   Alkaline Phosphatase 59 25 - 125 U/L   ALT 27 7 - 35 U/L   AST 23 13 - 35 U/L   Bilirubin, Total 0.4 mg/dL  Estimated GFR     Status: None   Collection Time: 08/18/16 12:00 AM  Result Value Ref Range   EGFR (Non-African Amer.) 110    EGFR (African American) 120   Chloride     Status: None   Collection Time: 08/18/16 12:00 AM  Result Value Ref Range   Chloride 99 mmol/L  Carbon Monoxide, Blood     Status: None   Collection  Time: 08/18/16 12:00 AM  Result Value Ref Range   Carbon Dioxide, Total 29   Calcium     Status: None   Collection Time: 08/18/16 12:00 AM  Result Value Ref Range   Calcium 9.9 mg/dL  Protein and glucose, CSF     Status: None   Collection Time: 08/18/16 12:00 AM  Result Value Ref Range   Protein 6.8   Albumin     Status: None   Collection Time: 08/18/16 12:00 AM  Result Value Ref Range   Albumin 4.2   Magnesium     Status: None   Collection Time: 08/18/16 12:00 AM  Result Value Ref Range   Magnesium 1.5   Phosphorus     Status: None   Collection Time: 08/18/16 12:00 AM  Result Value Ref Range   Phosphorus 2.9   TSH     Status: None   Collection Time: 08/18/16 12:00 AM  Result Value Ref Range   TSH 2.50 0.41 - 5.90 uIU/mL  Direct Dialysis Free T4     Status: None   Collection Time: 08/18/16 12:00 AM  Result Value Ref Range   Free T4 1.3   CHG T CELL ABSOLUTE COUNT/RATIO     Status: None   Collection Time: 08/18/16 12:00 AM  Result Value Ref Range   VITAMIN B12 770   VITAMIN D 25 Hydroxy (Vit-D Deficiency, Fractures)     Status: None   Collection Time: 08/18/16 12:00 AM  Result Value Ref Range   Vit D, 25-Hydroxy 30   POCT glycosylated hemoglobin (Hb A1C)     Status: None   Collection Time: 08/18/16 10:50 AM  Result Value Ref Range   Hemoglobin A1C 7.7   Microalbumin / creatinine urine ratio     Status: None   Collection Time: 08/18/16 11:20 AM  Result Value Ref Range   Creatinine, Urine 105 20 - 320 mg/dL   Microalb, Ur 0.5 Not estab mg/dL   Microalb Creat Ratio 5 <30 mcg/mg creat    Comment: The ADA has defined abnormalities in albumin excretion as follows:           Category           Result                            (mcg/mg creatinine)  Normal:    <30       Microalbuminuria:    30 - 299   Clinical albuminuria:    > or = 300   The ADA recommends that at least two of three specimens collected within a 3 - 6 month period be abnormal  before considering a patient to be within a diagnostic category.

## 2016-10-18 NOTE — Assessment & Plan Note (Addendum)
Has gained since last OV due to "start of the holiday season"  Counseling done- advised wt loss.  Use lose it or my fitness pal---> track all food and drinks or GO TO [employer] meetings.   Discussed with patient importance of weight loss to help achieve health goals and how increasing weight, correlates to increasing risk of disease. (or increasing risk of not controlling existing diseases.)

## 2016-10-18 NOTE — Assessment & Plan Note (Signed)
>>  ASSESSMENT AND PLAN FOR HYPERTRIGLYCERIDEMIA WRITTEN ON 10/18/2016 11:44 PM BY OPALSKI, DEBORAH, DO  Fish oil at higher doses d/c pt.  Diet and exercise encouraged

## 2016-10-18 NOTE — Assessment & Plan Note (Signed)
Doing well on Lexapro.  Feels she needs a little higher dose though. I explained can take 6-8 wks to reach full effect  - rec counselor---> gave her names of some to contact.

## 2016-10-18 NOTE — Assessment & Plan Note (Signed)
>>  ASSESSMENT AND PLAN FOR OBESITY, CLASS III, BMI 40-49.9 (MORBID OBESITY) (HCC) WRITTEN ON 10/18/2016 11:40 PM BY OPALSKI, DEBORAH, DO  Has gained since last OV due to "start of the holiday season"  Counseling done- advised wt loss.  Use lose it or my fitness pal---> track all food and drinks or GO TO [employer] meetings.   Discussed with patient importance of weight loss to help achieve health goals and how increasing weight, correlates to increasing risk of disease. (or increasing risk of not controlling existing diseases.)

## 2016-10-18 NOTE — Assessment & Plan Note (Addendum)
The problem of recurrent insomnia is discussed. Sleep hygiene issues are reviewed.   Avoid caffeinated beverages after lunch,  no alcoholic beverages,  no eating within 2-3 hours of lying down,  avoid exposure to blue light before bed,  avoid daytime naps, and  needs to maintain a regular sleep schedule- go to sleep and wake up around the same time every night.   R/B meds d/c pt- Trazodone started. For the trazodone, please start with a half a tablet about 45 minutes before you want to fall sleep. If you sleep okay but not great, the next night start with one full tablet. You can even go up to 2 tablets nightly.  - Resolve concerns or worries before entering bedroom:  Discussed relaxation techniques with patient and to keep a journal to write down fears\ worries.  I suggested seeing a counselor for CBT.   - Recommend patient meditate or do deep breathing exercises to help relax.   Incorporate the use of white noise machines or listen to "sleep meditation music", or recordings of guided meditations for sleep from YouTube which are free, such as  "guided meditation for detachment from over thinking"  by Mayford Knife.

## 2016-11-15 ENCOUNTER — Encounter: Payer: Self-pay | Admitting: Family Medicine

## 2016-11-15 ENCOUNTER — Ambulatory Visit (INDEPENDENT_AMBULATORY_CARE_PROVIDER_SITE_OTHER): Admitting: Family Medicine

## 2016-11-15 VITALS — BP 123/81 | HR 99 | Temp 97.7°F | Ht 68.25 in | Wt 297.3 lb

## 2016-11-15 DIAGNOSIS — J209 Acute bronchitis, unspecified: Secondary | ICD-10-CM

## 2016-11-15 DIAGNOSIS — R062 Wheezing: Secondary | ICD-10-CM

## 2016-11-15 DIAGNOSIS — E119 Type 2 diabetes mellitus without complications: Secondary | ICD-10-CM | POA: Diagnosis not present

## 2016-11-15 DIAGNOSIS — R05 Cough: Secondary | ICD-10-CM | POA: Diagnosis not present

## 2016-11-15 DIAGNOSIS — R059 Cough, unspecified: Secondary | ICD-10-CM

## 2016-11-15 MED ORDER — METHYLPREDNISOLONE ACETATE 40 MG/ML IJ SUSP
40.0000 mg | Freq: Once | INTRAMUSCULAR | Status: AC
  Administered 2016-11-15: 40 mg via INTRAMUSCULAR

## 2016-11-15 MED ORDER — PREDNISONE 10 MG (21) PO TBPK: ORAL_TABLET | ORAL | 0 refills | Status: DC

## 2016-11-15 MED ORDER — AZITHROMYCIN 250 MG PO TABS: ORAL_TABLET | ORAL | 0 refills | Status: DC

## 2016-11-15 MED ORDER — IPRATROPIUM-ALBUTEROL 0.5-2.5 (3) MG/3ML IN SOLN
3.0000 mL | Freq: Once | RESPIRATORY_TRACT | Status: AC
  Administered 2016-11-15: 3 mL via RESPIRATORY_TRACT

## 2016-11-15 MED ORDER — HYDROCOD POLST-CPM POLST ER 10-8 MG/5ML PO SUER: 5.0000 mL | Freq: Two times a day (BID) | ORAL | 0 refills | Status: DC | PRN

## 2016-11-15 MED ORDER — ALBUTEROL SULFATE HFA 108 (90 BASE) MCG/ACT IN AERS: INHALATION_SPRAY | RESPIRATORY_TRACT | 3 refills | Status: DC

## 2016-11-15 NOTE — Patient Instructions (Addendum)
Patient to be out of work tomorrow and likely throughout the rest of the week since shortness of breath and wheeze is bad with hypoxia.Rachel Simmons please give patient note excuse for the rest of the week to be out of work.  Please let me know if you're not improving and she will need a chest x-ray and for me to listen to you again if the symptoms aren't turning around.   Bronchospasm, Adult A bronchospasm is a spasm or tightening of the airways going into the lungs. During a bronchospasm breathing becomes more difficult because the airways get smaller. When this happens there can be coughing, a whistling sound when breathing (wheezing), and difficulty breathing. Bronchospasm is often associated with asthma, but not all patients who experience a bronchospasm have asthma. What are the causes? A bronchospasm is caused by inflammation or irritation of the airways. The inflammation or irritation may be triggered by:  Allergies (such as to animals, pollen, food, or mold). Allergens that cause bronchospasm may cause wheezing immediately after exposure or many hours later.  Infection. Viral infections are believed to be the most common cause of bronchospasm.  Exercise.  Irritants (such as pollution, cigarette smoke, strong odors, aerosol sprays, and paint fumes).  Weather changes. Winds increase molds and pollens in the air. Rain refreshes the air by washing irritants out. Cold air may cause inflammation.  Stress and emotional upset. What are the signs or symptoms?  Wheezing.  Excessive nighttime coughing.  Frequent or severe coughing with a simple cold.  Chest tightness.  Shortness of breath. How is this diagnosed? Bronchospasm is usually diagnosed through a history and physical exam. Tests, such as chest X-rays, are sometimes done to look for other conditions. How is this treated?  Inhaled medicines can be given to open up your airways and help you breathe. The medicines can be given using  either an inhaler or a nebulizer machine.  Corticosteroid medicines may be given for severe bronchospasm, usually when it is associated with asthma. Follow these instructions at home:  Always have a plan prepared for seeking medical care. Know when to call your health care provider and local emergency services (911 in the [place] Know where you can access local emergency care.  Only take medicines as directed by your health care provider.  If you were prescribed an inhaler or nebulizer machine, ask your health care provider to explain how to use it correctly. Always use a spacer with your inhaler if you were given one.  It is necessary to remain calm during an attack. Try to relax and breathe more slowly.  Control your home environment in the following ways:  Change your heating and air conditioning filter at least once a month.  Limit your use of fireplaces and wood stoves.  Do not smoke and do not allow smoking in your home.  Avoid exposure to perfumes and fragrances.  Get rid of pests (such as roaches and mice) and their droppings.  Throw away plants if you see mold on them.  Keep your house clean and dust free.  Replace carpet with wood, tile, or vinyl flooring. Carpet can trap dander and dust.  Use allergy-proof pillows, mattress covers, and box spring covers.  Wash bed sheets and blankets every week in hot water and dry them in a dryer.  Use blankets that are made of polyester or cotton.  Wash hands frequently. Contact a health care provider if:  You have muscle aches.  You have chest pain.  The  sputum changes from clear or white to yellow, green, gray, or bloody.  The sputum you cough up gets thicker.  There are problems that may be related to the medicine you are given, such as a rash, itching, swelling, or trouble breathing. Get help right away if:  You have worsening wheezing and coughing even after taking your prescribed medicines.  You have increased  difficulty breathing.  You develop severe chest pain. This information is not intended to replace advice given to you by your health care provider. Make sure you discuss any questions you have with your health care provider. Document Released: 11/24/2003 Document Revised: 04/28/2016 Document Reviewed: 05/13/2013 Elsevier Interactive Patient Education  2017 [employer] Acute Bronchitis, Adult Acute bronchitis is sudden (acute) swelling of the air tubes (bronchi) in the lungs. Acute bronchitis causes these tubes to fill with mucus, which can make it hard to breathe. It can also cause coughing or wheezing. In adults, acute bronchitis usually goes away within 2 weeks. A cough caused by bronchitis may last up to 3 weeks. Smoking, allergies, and asthma can make the condition worse. Repeated episodes of bronchitis may cause further lung problems, such as chronic obstructive pulmonary disease (COPD). What are the causes? This condition can be caused by germs and by substances that irritate the lungs, including:  Cold and flu viruses. This condition is most often caused by the same virus that causes a cold.  Bacteria.  Exposure to tobacco smoke, dust, fumes, and air pollution. What increases the risk? This condition is more likely to develop in people who:  Have close contact with someone with acute bronchitis.  Are exposed to lung irritants, such as tobacco smoke, dust, fumes, and vapors.  Have a weak immune system.  Have a respiratory condition such as asthma. What are the signs or symptoms? Symptoms of this condition include:  A cough.  Coughing up clear, yellow, or green mucus.  Wheezing.  Chest congestion.  Shortness of breath.  A fever.  Body aches.  Chills.  A sore throat. How is this diagnosed? This condition is usually diagnosed with a physical exam. During the exam, your health care provider may order tests, such as chest X-rays, to rule out other conditions. He or  she may also:  Test a sample of your mucus for bacterial infection.  Check the level of oxygen in your blood. This is done to check for pneumonia.  Do a chest X-ray or lung function testing to rule out pneumonia and other conditions.  Perform blood tests. Your health care provider will also ask about your symptoms and medical history. How is this treated? Most cases of acute bronchitis clear up over time without treatment. Your health care provider may recommend:  Drinking more fluids. Drinking more makes your mucus thinner, which may make it easier to breathe.  Taking a medicine for a fever or cough.  Taking an antibiotic medicine.  Using an inhaler to help improve shortness of breath and to control a cough.  Using a cool mist vaporizer or humidifier to make it easier to breathe. Follow these instructions at home: Medicines  Take over-the-counter and prescription medicines only as told by your health care provider.  If you were prescribed an antibiotic, take it as told by your health care provider. Do not stop taking the antibiotic even if you start to feel better. General instructions  Get plenty of rest.  Drink enough fluids to keep your urine clear or pale yellow.  Avoid smoking and  secondhand smoke. Exposure to cigarette smoke or irritating chemicals will make bronchitis worse. If you smoke and you need help quitting, ask your health care provider. Quitting smoking will help your lungs heal faster.  Use an inhaler, cool mist vaporizer, or humidifier as told by your health care provider.  Keep all follow-up visits as told by your health care provider. This is important. How is this prevented? To lower your risk of getting this condition again:  Wash your hands often with soap and water. If soap and water are not available, use hand sanitizer.  Avoid contact with people who have cold symptoms.  Try not to touch your hands to your mouth, nose, or eyes.  Make sure to  get the flu shot every year. Contact a health care provider if:  Your symptoms do not improve in 2 weeks of treatment. Get help right away if:  You cough up blood.  You have chest pain.  You have severe shortness of breath.  You become dehydrated.  You faint or keep feeling like you are going to faint.  You keep vomiting.  You have a severe headache.  Your fever or chills gets worse. This information is not intended to replace advice given to you by your health care provider. Make sure you discuss any questions you have with your health care provider. Document Released: 12/29/2004 Document Revised: 06/15/2016 Document Reviewed: 05/11/2016 Elsevier Interactive Patient Education  2017 [employer].

## 2016-11-15 NOTE — Assessment & Plan Note (Addendum)
Aware of affects of prednisone on BS--- monitor closely and call w Q/concerns.  But due to hypoxia, SOB and dec aeration- we feel risks<benefits after our discussion today on pt's current sx.   Aware may go up into 300-400 range even but should not have sx.

## 2016-11-15 NOTE — Assessment & Plan Note (Signed)
Post- Duoneb-->  Minimal improvmnt w/ aeration.  Mild exp wh only after txmnt due to still very tight/ bronchospastic.     Pt stable----> d/c on steroids, albuterol, and other meds per orders.

## 2016-11-15 NOTE — Progress Notes (Signed)
Assessment and plan:  1. Bronchospasm with bronchitis, acute   2. Wheezing   3. Acute bronchitis, unspecified organism   4. Type II Diabetes mellitus without complication ([place])   5. Cough     Type II Diabetes mellitus without complication ([facility]) Aware of affects of prednisone on BS--- monitor closely and call w Q/concerns.  But due to hypoxia, SOB and dec aeration- we feel risks<benefits after our discussion today on pt's current sx.   Aware may go up into 300-400 range even but should not have sx.   Bronchospasm with bronchitis, acute Post- Duoneb-->  Minimal improvmnt w/ aeration.  Mild exp wh only after txmnt due to still very tight/ bronchospastic.     Pt stable----> d/c on steroids, albuterol, and other meds per orders.   1. meds and orders and txmnt rendered- see below 2. Patient to be out of work tomorrow and likely throughout the rest of the week since shortness of breath and wheeze is bad with hypoxia.[name] please give patient note excuse for the rest of the week to be out of work. 3. Please let me know if you're not improving---> in that case- will need a chest x-ray and for me to listen to you again if the symptoms aren't turning around.  Anticipatory guidance and routine counseling done re: condition, txmnt options and need for follow up. All questions of patient's were answered.  - Viral vs Allergic vs Bacterial causes for pt's symptoms reveiwed.    - Supportive care and various OTC medications discussed in addition to any prescribed. - Call or RTC if new symptoms, or if no improvement or worse over next couple days.   - Will consider ABX at that time if sx continue past 7-10 days and worsening.    New Prescriptions   ALBUTEROL (PROVENTIL HFA;VENTOLIN HFA) 108 (90 BASE) MCG/ACT INHALER    1-2 puffs every 3-4 hours when necessary wheeze\SOB   AZITHROMYCIN (ZITHROMAX Z-PAK) 250 MG TABLET    Take 2 tablets (500 mg) on  Day 1,  followed by 1 tablet (250 mg) once daily on  Days 2 through 5.   CHLORPHENIRAMINE-HYDROCODONE (TUSSIONEX) 10-8 MG/5ML SUER    Take 5 mLs by mouth every 12 (twelve) hours as needed for cough (cough, will cause drowsiness.).   PREDNISONE (STERAPRED UNI-PAK 21 TAB) 10 MG (21) TBPK TABLET    12 day taper pack, use as directed    Modified Medications   No medications on file    Discontinued Medications   ERGOCALCIFEROL (VITAMIN D2) 400 UNITS TABS    Take 1 tablet by mouth daily.     Gross side effects, risk and benefits, and alternatives of medications discussed with patient.  Patient is aware that all medications have potential side effects and we are unable to predict every sideeffect or drug-drug interaction that may occur.  Expresses verbal understanding and consents to current therapy plan and treatment regiment.  Return if symptoms worsen or fail to improve, for f/up near future for chornic dx mgt since this was sick OV.  Please see AVS handed out to patient at the end of our visit for additional patient instructions/ counseling done pertaining to today's office visit.  Note: This document was prepared using Dragon voice recognition software and may include unintentional dictation errors.    Subjective:    Chief Complaint  Patient presents with  . URI    HPI:  Pt presents with URI sx for 7 days.  Throat congestion and hoarseness.  Coughing - dry hacking- even makes her chest hurt.  Feels SOB and wheezing/ Feels very tight.      Denies objective F/C, No face pain or ear pain- b/l face was little congested couple d ago, No N/V/D, No SOB/DIB, No Rash.    mucinex- daytime and Nitetime  Overall getting W; weaker.   Hasn't worked this whole time she has been sick .     Patient Active Problem List   Diagnosis Date Noted  . Chronic lymphoblastic leukemia ([place]) 08/18/2016    Priority: High  . Type II Diabetes mellitus without complication ([place]) [id number]    Priority: High  . Hypertension 08/18/2016    Priority:  High  . Thyroid disease 08/18/2016    Priority: High  . Bronchospasm with bronchitis, acute 11/15/2016  . Hypertriglyceridemia 10/18/2016  . Insomnia 10/18/2016  . 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 Family history of colon cancer requiring screening colonoscopy 08/20/2016  . Obesity, Class III, BMI 40-49.9 (morbid obesity) ([place]) 08/20/2016  . Adjustment disorder with mixed anxiety and depressed mood 08/20/2016  . Chronic back pain greater than 3 months duration 08/18/2016  . Genetic testing 05/21/2015  . Family history of breast cancer   . Family history of colon cancer- brother age 33   . Family history of polyps in the colon     Past medical history, Surgical history, Family history reviewed and noted below, Social history, Allergies, and Medications have been entered into the medical record, reviewed and changed as needed.   No Known Allergies  Review of Systems  Constitutional: Positive for malaise/fatigue. Negative for chills and fever.  HENT: Positive for congestion and sore throat. Negative for ear discharge, ear pain and sinus pain.   Eyes: Negative for pain and discharge.  Respiratory: Positive for cough, sputum production, shortness of breath and wheezing. Negative for stridor.   Cardiovascular: Positive for chest pain and palpitations.  Gastrointestinal: Negative for diarrhea, nausea and vomiting.  Genitourinary: Negative for dysuria.  Musculoskeletal: Negative for neck pain.  Skin: Negative for rash.  Neurological: Negative for dizziness and headaches.  Endo/Heme/Allergies: Negative for environmental allergies.  Psychiatric/Behavioral: The patient does not have insomnia.     Objective:   Blood pressure 123/81, pulse 99, temperature 97.7 F (36.5 C), temperature source Oral, height 5' 8.25" (1.734 m), weight 297 lb 4.8 oz (134.9 kg), SpO2 94 %. Body mass index is 44.87 kg/m. General: Well Developed, well nourished, appropriate for stated age.  Neuro:  Alert and oriented x3, extra-ocular muscles intact, sensation grossly intact.  HEENT: Normocephalic, atraumatic, pupils equal round reactive to light, neck supple, no masses, no painful lymphadenopathy, TM's intact B/L- L obscured due to cerumen, R- mild buldge, no acute findings. Nares- patent, clear d/c, OP- clear, mild erythema, No TTP sinuses, op- clear w mild erythema only Skin: Warm and dry, no gross rash. Cardiac: very distant but Ess. RRR, S1 S2 Respiratory: dec aeration globally--> insp and exp- ant/post equally, Not using accessory muscles, speaking in full sentences-labored some. Vascular:  Warm, pink,  cap RF less 2 sec. Psych: No HI/SI, judgement and insight good, Euthymic mood. Full Affect.   Patient Care Team    Relationship Specialty Notifications Start End  [name], DO PCP - General Family Medicine  08/18/16

## 2016-11-30 ENCOUNTER — Other Ambulatory Visit: Payer: Self-pay

## 2016-11-30 MED ORDER — ALBUTEROL SULFATE HFA 108 (90 BASE) MCG/ACT IN AERS: INHALATION_SPRAY | RESPIRATORY_TRACT | 3 refills | Status: DC

## 2016-11-30 NOTE — Telephone Encounter (Signed)
Pt needs this medication sent to [facility] due to insurance coverage.  Refill sent to [facility].  Charyl Bigger, CMA

## 2016-12-03 ENCOUNTER — Other Ambulatory Visit: Payer: Self-pay | Admitting: Family Medicine

## 2016-12-16 ENCOUNTER — Ambulatory Visit: Admitting: Family Medicine

## 2017-02-07 ENCOUNTER — Ambulatory Visit: Admitting: Family Medicine

## 2017-03-02 ENCOUNTER — Ambulatory Visit: Admitting: Family Medicine

## 2017-03-03 ENCOUNTER — Other Ambulatory Visit: Payer: Self-pay | Admitting: Family Medicine

## 2017-03-20 ENCOUNTER — Other Ambulatory Visit: Payer: Self-pay | Admitting: Family Medicine

## 2017-03-28 ENCOUNTER — Ambulatory Visit: Admitting: Family Medicine

## 2017-03-28 DIAGNOSIS — C9110 Chronic lymphocytic leukemia of B-cell type not having achieved remission: Secondary | ICD-10-CM

## 2017-03-28 DIAGNOSIS — D509 Iron deficiency anemia, unspecified: Secondary | ICD-10-CM | POA: Diagnosis not present

## 2017-03-28 LAB — CBC AND DIFFERENTIAL
HCT: 36 (ref 36–46)
Hemoglobin: 11.9 — AB (ref 12.0–16.0)
Platelets: 255 (ref 150–399)
WBC: 19.8

## 2017-03-29 ENCOUNTER — Other Ambulatory Visit: Payer: Self-pay | Admitting: Family Medicine

## 2017-04-20 ENCOUNTER — Encounter: Payer: Self-pay | Admitting: Family Medicine

## 2017-04-20 ENCOUNTER — Ambulatory Visit (INDEPENDENT_AMBULATORY_CARE_PROVIDER_SITE_OTHER): Admitting: Family Medicine

## 2017-04-20 VITALS — BP 113/79 | HR 74 | Temp 98.2°F | Ht 68.25 in | Wt 289.0 lb

## 2017-04-20 DIAGNOSIS — E119 Type 2 diabetes mellitus without complications: Secondary | ICD-10-CM

## 2017-04-20 DIAGNOSIS — E781 Pure hyperglyceridemia: Secondary | ICD-10-CM | POA: Diagnosis not present

## 2017-04-20 DIAGNOSIS — E66813 Obesity, class 3: Secondary | ICD-10-CM

## 2017-04-20 DIAGNOSIS — E1142 Type 2 diabetes mellitus with diabetic polyneuropathy: Secondary | ICD-10-CM | POA: Diagnosis not present

## 2017-04-20 DIAGNOSIS — Z800 Family history of malignant neoplasm of digestive organs: Secondary | ICD-10-CM

## 2017-04-20 DIAGNOSIS — F4323 Adjustment disorder with mixed anxiety and depressed mood: Secondary | ICD-10-CM

## 2017-04-20 DIAGNOSIS — E079 Disorder of thyroid, unspecified: Secondary | ICD-10-CM

## 2017-04-20 DIAGNOSIS — I10 Essential (primary) hypertension: Secondary | ICD-10-CM | POA: Diagnosis not present

## 2017-04-20 DIAGNOSIS — F5102 Adjustment insomnia: Secondary | ICD-10-CM | POA: Diagnosis not present

## 2017-04-20 LAB — POCT GLYCOSYLATED HEMOGLOBIN (HGB A1C): HEMOGLOBIN A1C: 7.6

## 2017-04-20 MED ORDER — LEVOTHYROXINE SODIUM 100 MCG PO TABS: 100.0000 ug | ORAL_TABLET | Freq: Every day | ORAL | 3 refills | Status: DC

## 2017-04-20 MED ORDER — METFORMIN HCL ER 500 MG PO TB24: 1000.0000 mg | ORAL_TABLET | Freq: Two times a day (BID) | ORAL | 3 refills | Status: DC

## 2017-04-20 MED ORDER — ATORVASTATIN CALCIUM 20 MG PO TABS: 20.0000 mg | ORAL_TABLET | Freq: Every day | ORAL | 3 refills | Status: DC

## 2017-04-20 MED ORDER — TRAZODONE HCL 100 MG PO TABS: ORAL_TABLET | ORAL | 1 refills | Status: DC

## 2017-04-20 MED ORDER — ESCITALOPRAM OXALATE 20 MG PO TABS: 20.0000 mg | ORAL_TABLET | Freq: Every day | ORAL | 3 refills | Status: DC

## 2017-04-20 MED ORDER — GABAPENTIN 300 MG PO CAPS: 300.0000 mg | ORAL_CAPSULE | Freq: Three times a day (TID) | ORAL | 5 refills | Status: DC

## 2017-04-20 MED ORDER — LISINOPRIL-HYDROCHLOROTHIAZIDE 20-25 MG PO TABS: 1.0000 | ORAL_TABLET | Freq: Every day | ORAL | 3 refills | Status: DC

## 2017-04-20 NOTE — Assessment & Plan Note (Signed)
Well controlled; cont meds

## 2017-04-20 NOTE — Assessment & Plan Note (Addendum)
A1c 7.6 today - improved from prior but still not at goal.  Patient has worked intensively on lifestyle and diet modification for just the past 3 weeks or so with better blood sugar control since.   She will continue her 1000 metformin in the morning and 1000 metformin in the evening that she has been taking and monitor.    She knows that if she continues to lose weight, those sugars will go down and she will need less meds.  Going for eye exam today.  Up-to-date on foot and urine microalbumin

## 2017-04-20 NOTE — Assessment & Plan Note (Signed)
>>  ASSESSMENT AND PLAN FOR THYROID DISEASE WRITTEN ON 04/20/2017  1:21 PM BY OPALSKI, DEBORAH, DO  Cont meds- RF given; well controlled

## 2017-04-20 NOTE — Assessment & Plan Note (Signed)
Doing very well since got rid of BF and got a new job  COnt meds  Work on improving health now- diet/ wt loss

## 2017-04-20 NOTE — Assessment & Plan Note (Signed)
>>  ASSESSMENT AND PLAN FOR HYPERTRIGLYCERIDEMIA WRITTEN ON 04/20/2017  1:20 PM BY OPALSKI, DEBORAH, DO  Cont meds; will check labs next 81mo f/up OV

## 2017-04-20 NOTE — Assessment & Plan Note (Signed)
Referral for colonoscopy placed 9/17.   Reminded pt of this today  She should've had it at age 51 since brother with onset colon cancer age 47.

## 2017-04-20 NOTE — Assessment & Plan Note (Signed)
Continue medications, trazodone refill given.

## 2017-04-20 NOTE — Assessment & Plan Note (Signed)
Cont meds; will check labs next 82mo f/up OV

## 2017-04-20 NOTE — Assessment & Plan Note (Signed)
RF neurontin- works well to help control sx

## 2017-04-20 NOTE — Progress Notes (Signed)
Impression and Recommendations:    1. Diabetes mellitus without complication ([place])   2. Essential hypertension   3. Hypertriglyceridemia   4. Adjustment disorder with mixed anxiety and depressed mood   5. Obesity, Class III, BMI 40-49.9 (morbid obesity) ([place])   6. Thyroid disease   7. Adjustment insomnia   8. Diabetic peripheral neuropathy ([place])   9. Family history of colon cancer- brother age 50     Goal FBS  100-130;   2 hr PP ( after lrgest meal)  less than 160-170.  -- ok to change metformin from 2 tabs twice daily to 1 tab twice daily to goal bld sugars above.   -- Wt loss: Pt has been doing well with wt loss so far.  But she wants more structure.   For accountability use the lose it app on your phone and track everything you eat.  Follow-up in one month.  -- in 3 mo-->  Check bldwrk-- come fasting.    Type II Diabetes mellitus without complication (HCC) G8Q 7.6 today - improved from prior but still not at goal.  Patient has worked intensively on lifestyle and diet modification for just the past 3 weeks or so with better blood sugar control since.   She will continue her 1000 metformin in the morning and 1000 metformin in the evening that she has been taking and monitor.    She knows that if she continues to lose weight, those sugars will go down and she will need less meds.  Going for eye exam today.  Up-to-date on foot and urine microalbumin  Hypertension Well controlled; cont meds  Diabetic peripheral neuropathy ([place]) RF neurontin- works well to help control sx  Insomnia Continue medications, trazodone refill given.  Hypertriglyceridemia Cont meds; will check labs next 5mo f/up OV  Adjustment disorder with mixed anxiety and depressed mood Doing very well since got rid of BF and got a new job  COnt meds  Work on improving health now- diet/ wt loss  Thyroid disease Cont meds- RF given; well controlled  Family history of colon cancer- brother age  25 Referral for colonoscopy placed 9/17.   Reminded pt of this today  She should've had it at age 32 since brother with onset colon cancer age 28.   Education and routine counseling performed. Handouts provided.   Meds ordered this encounter  Medications  . DISCONTD: Multiple Vitamin (MULTIVITAMIN WITH MINERALS) TABS tablet    Sig: Take 1 tablet by mouth daily.  . IRON PO    Sig: Take 65 mg by mouth daily.  . metFORMIN (GLUCOPHAGE-XR) 500 MG 24 hr tablet    Sig: Take 2 tablets (1,000 mg total) by mouth 2 (two) times daily.    Dispense:  360 tablet    Refill:  3  . levothyroxine (SYNTHROID, LEVOTHROID) 100 MCG tablet    Sig: Take 1 tablet (100 mcg total) by mouth daily.    Dispense:  90 tablet    Refill:  3  . lisinopril-hydrochlorothiazide (PRINZIDE,ZESTORETIC) 20-25 MG tablet    Sig: Take 1 tablet by mouth daily.    Dispense:  90 tablet    Refill:  3  . escitalopram (LEXAPRO) 20 MG tablet    Sig: Take 1 tablet (20 mg total) by mouth daily.    Dispense:  90 tablet    Refill:  3  . atorvastatin (LIPITOR) 20 MG tablet    Sig: Take 1 tablet (20 mg total) by mouth at bedtime.  Dispense:  90 tablet    Refill:  3  . traZODone (DESYREL) 100 MG tablet    Sig: 1 tabs nightly    Dispense:  90 tablet    Refill:  1  . gabapentin (NEURONTIN) 300 MG capsule    Sig: Take 1 capsule (300 mg total) by mouth 3 (three) times daily.    Dispense:  90 capsule    Refill:  5     Orders Placed This Encounter  Procedures  . CBC with Differential/Platelet  . Lipid panel  . Magnesium  . Phosphorus  . TSH  . VITAMIN D 25 Hydroxy (Vit-D Deficiency, Fractures)  . Vitamin B12  . POCT HgB A1C     Return in about 4 weeks (around 05/18/2017) for diet check 1 mo; 25mo - DM f/up w labs.  The patient was counseled, risk factors were discussed, anticipatory guidance given.  Gross side effects, risk and benefits, and alternatives of medications discussed with patient.  Patient is aware that  all medications have potential side effects and we are unable to predict every side effect or drug-drug interaction that may occur.  Expresses verbal understanding and consents to current therapy plan and treatment regimen.  Please see AVS handed out to patient at the end of our visit for further patient instructions/ counseling done pertaining to today's office visit.    Note: This document was prepared using Dragon voice recognition software and may include unintentional dictation errors.     Subjective:    Chief Complaint  Patient presents with  . Diabetes    Rachel Simmons is a 51 y.o. female who presents to [place] [facility] today for Diabetes Management.    Wt :   Morbid obesity On 10/18/16:   BP 117/84   Pulse 84   Ht 5' 8.25" (1.734 m)   Wt  310 lb 9.6 oz (140.9 kg)   BMI 46.88 kg/m   BSA 2.6 m --> been walking more--> doing 2-5 mile everyday.  Got a new job--> working 1/2 the time and making same amnt money.    Volunteering a lot still.    MOOD:  Been well controlled.  No need change in med.  Feeling stronger and more confident since BF left.  Sleeping well    DM HPI: -  She has been working on diet and exercise for diabetes  Pt is currently maintained on the following medications for diabetes:  Takes Metformin 1000mg  BID NOT 500 BID  Medication compliance - good  Home glucose readings range FBS:  117-125   Denies polyuria/polydipsia.  Denies hypo/ hyperglycemia symptoms  - She denies new onset of: chest pain, exercise intolerance, shortness of breath, dizziness, visual changes, headache, lower extremity swelling or claudication.   Complications:  polyneuropathy + = on neurontin , proliferative retinopathy having exam for her eyes today , NO Proteinuira   Last diabetic eye exam was  Lab Results  Component Value Date   HMDIABEYEEXA No Retinopathy 04/20/2016    Foot exam- UTD  Last A1C in the office was:  Lab Results   Component Value Date   HGBA1C 7.6 04/20/2017   HGBA1C 7.7 08/18/2016    Lab Results  Component Value Date   MICROALBUR 0.5 08/18/2016   LDLCALC 109 08/18/2016   CREATININE 0.6 08/18/2016    Last 3 blood pressure readings in our office are as follows: BP Readings from Last 3 Encounters:  04/20/17 113/79  11/15/16 123/81  10/18/16 117/84  Patient Care Team    Relationship Specialty Notifications Start End  [name], DO PCP - General Family Medicine  08/18/16      Patient Active Problem List   Diagnosis Date Noted  . Type II Diabetes mellitus without complication ([place]) 09/73/5329    Priority: High  . Hypertension 08/18/2016    Priority: High  . Thyroid disease 08/18/2016    Priority: High  . Diabetic peripheral neuropathy ([place]) 04/21/2015    Priority: High  . Hypertriglyceridemia 10/18/2016    Priority: Medium  . Insomnia 10/18/2016    Priority: Medium  . 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 Obesity, Class III, BMI 40-49.9 (morbid obesity) ([place]) 08/20/2016    Priority: Medium  . Adjustment disorder with mixed anxiety and depressed mood 08/20/2016    Priority: Medium  . 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 Family history of breast cancer     Priority: Low  . Family history of colon cancer- brother age 16     Priority: Low  . Family history of colon cancer requiring screening colonoscopy 08/20/2016  . Chronic back pain greater than 3 months duration 08/18/2016  . Genetic testing 05/21/2015  . Family history of polyps in the colon      Past Medical History:  Diagnosis Date  . Chronic lymphoblastic leukemia ([place])   . Diabetes mellitus without complication ([place])   . Family history of breast cancer   . Family history of colon cancer   . Family history of polyps in the colon   . Hypertension   . Thyroid disease      Past Surgical History:  Procedure Laterality Date  . ABDOMINAL  HYSTERECTOMY  2006   reports ovaries and cervix are intact  . TONSILLECTOMY       Family History  Problem Relation Age of Onset  . Breast cancer Mother        49s; deceased 30  . Colon polyps Mother        Dx 33s; #/type unknown  . Cancer Father        lung cancer; smoker; deceased 65  . Breast cancer Sister 40       triple negative; currently 100  . Cancer Maternal Aunt        lung cancer; smoker; currently 60  . Colon cancer Maternal Uncle        Dx 63s; currently 63  . Breast cancer Paternal Aunt        Dx 73s; currently 56s  . Cancer Paternal Uncle        liver; heavy drinker  . Cancer Maternal Grandfather        unk. primary; deceased 40s  . Cancer Paternal Grandmother        Dx 54s; unknown primary  . Breast cancer Sister 38       currently 53  . Uterine cancer Sister 31       currently 22  . Cancer Paternal Uncle        lung; heavy smoker  . Breast cancer Cousin        mat first cousin through aunt; poss. breast ca; had mastectomy; currently 50  . Ovarian cancer Other        mat grandmother's sister  . Cancer Other        stomach cancer; mat grandmother's sister  . Colon polyps Brother        age, #, type  unknown     History  Drug Use No  ,  History  Alcohol Use No  ,  History  Smoking Status  . Never Smoker  Smokeless Tobacco  . Never Used  ,    Current Outpatient Prescriptions on File Prior to Visit  Medication Sig Dispense Refill  . aspirin 81 MG chewable tablet Chew 81 mg by mouth daily.    . calcium carbonate (CALCIUM 600) 600 MG TABS tablet Take 1 tablet by mouth daily.    . cetirizine (ZYRTEC) 5 MG tablet Take 1 tablet by mouth daily.    . Cholecalciferol (VITAMIN D3) 5000 units TABS 5,000 IU OTC vitamin D3 daily. 90 tablet 121  . Omega-3 Fatty Acids (FISH OIL) 1200 MG CAPS Take 4-5 tabs daily    . Vitamin D, Ergocalciferol, (DRISDOL) 50000 units CAPS capsule Take 1 capsule (50,000 Units total) by mouth every 7 (seven) days. 12 capsule 20    No current facility-administered medications on file prior to visit.      No Known Allergies   Review of Systems:   General:  Denies fever, chills Optho/Auditory:   Denies visual changes, blurred vision Respiratory:   Denies SOB, cough, wheeze, DIB  Cardiovascular:   Denies chest pain, palpitations, painful respirations Gastrointestinal:   Denies nausea, vomiting, diarrhea.  Endocrine:     Denies new hot or cold intolerance Musculoskeletal:  Denies joint swelling, gait issues, or new unexplained myalgias/ arthralgias Skin:  Denies rash, suspicious lesions  Neurological:    Denies dizziness, unexplained weakness, numbness  Psychiatric/Behavioral:   Denies mood changes    Objective:     Blood pressure 113/79, pulse 74, temperature 98.2 F (36.8 C), temperature source Oral, height 5' 8.25" (1.734 m), weight 289 lb (131.1 kg), SpO2 94 %. Filed Weights   04/20/17 0851  Weight: 289 lb (131.1 kg)   Body mass index is 43.62 kg/m.  General: Well Developed, well nourished, and in no acute distress.  HEENT: Normocephalic, atraumatic, pupils equal round reactive to light, neck supple, No carotid bruits, no JVD Skin: Warm and dry, cap RF less 2 sec Cardiac: Regular rate and rhythm, S1, S2 WNL's, no murmurs rubs or gallops Respiratory: ECTA B/L, Not using accessory muscles, speaking in full sentences. NeuroM-Sk: Ambulates w/o assistance, moves ext * 4 w/o difficulty, sensation grossly intact.  Ext: scant edema b/l lower ext Psych: No HI/SI, judgement and insight good, Euthymic mood. Full Affect.

## 2017-04-20 NOTE — Assessment & Plan Note (Signed)
Cont meds- RF given; well controlled

## 2017-04-20 NOTE — Patient Instructions (Addendum)
Goal FBS  100-130;   2 hr PP ( after lrgest meal)  less than 160-170.   -- ok to change metformin from 2 tabs twice daily to 1 tab twice daily to goal bld sugars above.   - For accountability use the lose it app on your phone and track everything you eat.  Follow-up in one month.  - in 3 mo-->  Check bldwrk-- come fasting.   Guidelines for Losing Weight   We want weight loss that will last so you should lose 1-2 pounds a week.  THAT IS IT! Please pick THREE things a month to change. Once it is a habit check off the item. Then pick another three items off the list to become habits.  If you are already doing a habit on the list GREAT!  Cross that item off!  Don't drink your calories. Ie, alcohol, soda, fruit juice, and sweet tea.   Drink more water. Drink a glass when you feel hungry or before each meal.   Eat breakfast - Complex carb and protein (likeDannon light and fit yogurt, oatmeal, fruit, eggs, [place] bacon).  Measure your cereal.  Eat no more than one cup a day. (ie Kashi)  Eat an apple a day.  Add a vegetable a day.  Try a new vegetable a month.  Use Pam! Stop using oil or butter to cook.  Don't finish your plate or use smaller plates.  Share your dessert.  Eat sugar free Jello for dessert or frozen grapes.  Don't eat 2-3 hours before bed.  Switch to whole wheat bread, pasta, and brown rice.  Make healthier choices when you eat out. No fries!  Pick baked chicken, NOT fried.  Don't forget to SLOW DOWN when you eat. It is not going anywhere.   Take the stairs.  Park far away in the parking lot  Lift soup cans (or weights) for 10 minutes while watching TV.  Walk at work for 10 minutes during break.  Walk outside 1 time a week with your friend, kids, dog, or significant other.  Start a walking group at church.  Walk the mall as much as you can tolerate.   Keep a food diary.  Weigh yourself daily.  Walk for 15 minutes 3 days per week.  Cook at home  more often and eat out less. If life happens and you go back to old habits, it is okay.  Just start over. You can do it!  If you experience chest pain, get short of breath, or tired during the exercise, please stop immediately and inform your doctor.    Before you even begin to attack a weight-loss plan, it pays to remember this: You are not fat. You have fat. Losing weight isn't about blame or shame; it's simply another achievement to accomplish. Dieting is like any other skill-you have to buckle down and work at it. As long as you act in a smart, reasonable way, you'll ultimately get where you want to be. Here are some weight loss pearls for you.   1. It's Not a Diet. It's a Lifestyle Thinking of a diet as something you're on and suffering through only for the short term doesn't work. To shed weight and keep it off, you need to make permanent changes to the way you eat. It's OK to indulge occasionally, of course, but if you cut calories temporarily and then revert to your old way of eating, you'll gain back the weight quicker than you can say  yo-yo. Use it to lose it. Research shows that one of the best predictors of long-term weight loss is how many pounds you drop in the first month. For that reason, nutritionists often suggest being stricter for the first two weeks of your new eating strategy to build momentum. Cut out added sugar and alcohol and avoid unrefined carbs. After that, figure out how you can reincorporate them in a way that's healthy and maintainable.  2. There's a Right Way to Exercise Working out burns calories and fat and boosts your metabolism by building muscle. But those trying to lose weight are notorious for overestimating the number of calories they burn and underestimating the amount they take in. Unfortunately, your system is biologically programmed to hold on to extra pounds and that means when you start exercising, your body senses the deficit and ramps up its hunger  signals. If you're not diligent, you'll eat everything you burn and then some. Use it, to lose it. Cardio gets all the exercise glory, but strength and interval training are the real heroes. They help you build lean muscle, which in turn increases your metabolism and calorie-burning ability 3. Don't Overreact to Mild Hunger Some people have a hard time losing weight because of hunger anxiety. To them, being hungry is bad-something to be avoided at all costs-so they carry snacks with them and eat when they don't need to. Others eat because they're stressed out or bored. While you never want to get to the point of being ravenous (that's when bingeing is likely to happen), a hunger pang, a craving, or the fact that it's 3:00 p.m. should not send you racing for the vending machine or obsessing about the energy bar in your purse. Ideally, you should put off eating until your stomach is growling and it's difficult to concentrate.  Use it to lose it. When you feel the urge to eat, use the HALT method. Ask yourself, Am I really hungry? Or am I angry or anxious, lonely or bored, or tired? If you're still not certain, try the apple test. If you're truly hungry, an apple should seem delicious; if it doesn't, something else is going on. Or you can try drinking water and making yourself busy, if you are still hungry try a healthy snack.  4. Not All Calories Are Created Equal The mechanics of weight loss are pretty simple: Take in fewer calories than you use for energy. But the kind of food you eat makes all the difference. Processed food that's high in saturated fat and refined starch or sugar can cause inflammation that disrupts the hormone signals that tell your brain you're full. The result: You eat a lot more.  Use it to lose it. Clean up your diet. Swap in whole, unprocessed foods, including vegetables, lean protein, and healthy fats that will fill you up and give you the biggest nutritional bang for your calorie  buck. In a few weeks, as your brain starts receiving regular hunger and fullness signals once again, you'll notice that you feel less hungry overall and naturally start cutting back on the amount you eat.  5. Protein, Produce, and Plant-Based Fats Are Your Weight-Loss Trinity Here's why eating the three Ps regularly will help you drop pounds. Protein fills you up. You need it to build lean muscle, which keeps your metabolism humming so that you can torch more fat. People in a weight-loss program who ate double the recommended daily allowance for protein (about 110 grams for a 150-pound woman)  lost 70 percent of their weight from fat, while people who ate the RDA lost only about 40 percent, one study found. Produce is packed with filling fiber. "It's very difficult to consume too many calories if you're eating a lot of vegetables. Example: Three cups of broccoli is a lot of food, yet only 93 calories. (Fruit is another story. It can be easy to overeat and can contain a lot of calories from sugar, so be sure to monitor your intake.) Plant-based fats like olive oil and those in avocados and nuts are healthy and extra satiating.  Use it to lose it. Aim to incorporate each of the three Ps into every meal and snack. People who eat protein throughout the day are able to keep weight off, according to a study in the [place] of Clinical Nutrition. In addition to meat, poultry and seafood, good sources are beans, lentils, eggs, tofu, and yogurt. As for fat, keep portion sizes in check by measuring out salad dressing, oil, and nut butters (shoot for one to two tablespoons). Finally, eat veggies or a little fruit at every meal. People who did that consumed 308 fewer calories but didn't feel any hungrier than when they didn't eat more produce.  7. How You Eat Is As Important As What You Eat In order for your brain to register that you're full, you need to focus on what you're eating. Sit down whenever you eat,  preferably at a table. Turn off the TV or computer, put down your phone, and look at your food. Smell it. Chew slowly, and don't put another bite on your fork until you swallow. When women ate lunch this attentively, they consumed 30 percent less when snacking later than those who listened to an audiobook at lunchtime, according to a study in the [place] of Nutrition. 8. Weighing Yourself Really Works The scale provides the best evidence about whether your efforts are paying off. Seeing the numbers tick up or down or stagnate is motivation to keep going-or to rethink your approach. A 2015 study at [facility] found that daily weigh-ins helped people lose more weight, keep it off, and maintain that loss, even after two years. Use it to lose it. Step on the scale at the same time every day for the best results. If your weight shoots up several pounds from one weigh-in to the next, don't freak out. Eating a lot of salt the night before or having your period is the likely culprit. The number should return to normal in a day or two. It's a steady climb that you need to do something about. 9. Too Much Stress and Too Little Sleep Are Your Enemies When you're tired and frazzled, your body cranks up the production of cortisol, the stress hormone that can cause carb cravings. Not getting enough sleep also boosts your levels of ghrelin, a hormone associated with hunger, while suppressing leptin, a hormone that signals fullness and satiety. People on a diet who slept only five and a half hours a night for two weeks lost 55 percent less fat and were hungrier than those who slept eight and a half hours, according to a study in the [place]. Use it to lose it. Prioritize sleep, aiming for seven hours or more a night, which research shows helps lower stress. And make sure you're getting quality zzz's. If a snoring spouse or a fidgety cat wakes you up frequently throughout the night,  you may end up getting the  equivalent of just four hours of sleep, according to a study from [facility]. Keep pets out of the bedroom, and use a white-noise app to drown out snoring. 10. You Will Hit a plateau-And You Can Bust Through It As you slim down, your body releases much less leptin, the fullness hormone.  If you're not strength training, start right now. Building muscle can raise your metabolism to help you overcome a plateau. To keep your body challenged and burning calories, incorporate new moves and more intense intervals into your workouts or add another sweat session to your weekly routine. Alternatively, cut an extra 100 calories or so a day from your diet. Now that you've lost weight, your body simply doesn't need as much fuel.      Since food equals calories, in order to lose weight you must either eat fewer calories, exercise more to burn off calories with activity, or both. Food that is not used to fuel the body is stored as fat. A major component of losing weight is to make smarter food choices. Here's how:  1)   Limit non-nutritious foods, such as: Sugar, honey, syrups and candy Pastries, donuts, pies, cakes and cookies Soft drinks, sweetened juices and alcoholic beverages  2)  Cut down on high-fat foods by: - Choosing poultry, fish or lean red meat - Choosing low-fat cooking methods, such as baking, broiling, steaming, grilling and boiling - Using low-fat or non-fat dairy products - Using vinaigrette, herbs, lemon or fat-free salad dressings - Avoiding fatty meats, such as bacon, sausage, franks, ribs and luncheon meats - Avoiding high-fat snacks like nuts, chips and chocolate - Avoiding fried foods - Using less butter, margarine, oil and mayonnaise - Avoiding high-fat gravies, cream sauces and cream-based soups  3) Eat a variety of foods, including: - Fruit and vegetables that are raw, steamed or baked - Whole grains, breads, cereal, rice and pasta -  Dairy products, such as low-fat or non-fat milk or yogurt, low-fat cottage cheese and low-fat cheese - Protein-rich foods like chicken, [place], fish, lean meat and legumes, or beans  4) Change your eating habits by: - Eat three balanced meals a day to help control your hunger - Watch portion sizes and eat small servings of a variety of foods - Choose low-calorie snacks - Eat only when you are hungry and stop when you are satisfied - Eat slowly and try not to perform other tasks while eating - Find other activities to distract you from food, such as walking, taking up a hobby or being involved in the community - Include regular exercise in your daily routine ( minimum of 20 min of moderate-intensity exercise at least 5 days/week)  - Find a support group, if necessary, for emotional support in your weight loss journey         Easy ways to cut 100 calories   1. Eat your eggs with hot sauce OR salsa instead of cheese.  Eggs are great for breakfast, but many people consider eggs and cheese to be BFFs. Instead of cheese-1 oz. of cheddar has 114 calories-top your eggs with hot sauce, which contains no calories and helps with satiety and metabolism. Salsa is also a great option!!  2. Top your toast, waffles or pancakes with fresh berries instead of jelly or syrup. Half a cup of berries-fresh, frozen or thawed-has about 40 calories, compared with 2 tbsp. of maple syrup or jelly, which both have about 100 calories. The berries will also give you a  good punch of fiber, which helps keep you full and satisfied and won't spike blood sugar quickly like the jelly or syrup. 3. Swap the non-fat latte for black coffee with a splash of half-and-half. Contrary to its name, that non-fat latte has 130 calories and a startling 19g of carbohydrates per 16 oz. serving. Replacing that 'light' drinkable dessert with a black coffee with a splash of half-and-half saves you more than 100 calories per 16 oz. serving. 4.  Sprinkle salads with freeze-dried raspberries instead of dried cranberries. If you want a sweet addition to your nutritious salad, stay away from dried cranberries. They have a whopping 130 calories per  cup and 30g carbohydrates. Instead, sprinkle freeze-dried raspberries guilt-free and save more than 100 calories per  cup serving, adding 3g of belly-filling fiber. 5. Go for mustard in place of mayo on your sandwich. Mustard can add really nice flavor to any sandwich, and there are tons of varieties, from spicy to honey. A serving of mayo is 95 calories, versus 10 calories in a serving of mustard.  Or try an avocado mayo spread: You can find the recipe few click this link: https://www.californiaavocado.com/recipes/recipe-container/[place]-avocado-mayo 6. Choose a DIY salad dressing instead of the store-bought kind. Mix Dijon or whole grain mustard with low-fat Kefir or red wine vinegar and garlic. 7. Use hummus as a spread instead of a dip. Use hummus as a spread on a high-fiber cracker or tortilla with a sandwich and save on calories without sacrificing taste. 8. Pick just one salad "accessory." Salad isn't automatically a calorie winner. It's easy to over-accessorize with toppings. Instead of topping your salad with nuts, avocado and cranberries (all three will clock in at 313 calories), just pick one. The next day, choose a different accessory, which will also keep your salad interesting. You don't wear all your jewelry every day, right? 9. Ditch the white pasta in favor of spaghetti squash. One cup of cooked spaghetti squash has about 40 calories, compared with traditional spaghetti, which comes with more than 200. Spaghetti squash is also nutrient-dense. It's a good source of fiber and Vitamins A and C, and it can be eaten just like you would eat pasta-with a great tomato sauce and [place] meatballs or with pesto, tofu and spinach, for example. 10. Dress up your chili, soups and stews with  non-fat [place] yogurt instead of sour cream. Just a 'dollop' of sour cream can set you back 115 calories and a whopping 12g of fat-seven of which are of the artery-clogging variety. Added bonus: [place] yogurt is packed with muscle-building protein, calcium and B Vitamins. 11. Mash cauliflower instead of mashed potatoes. One cup of traditional mashed potatoes-in all their creamy goodness-has more than 200 calories, compared to mashed cauliflower, which you can typically eat for less than 100 calories per 1 cup serving. Cauliflower is a great source of the antioxidant indole-3-carbinol (I3C), which may help reduce the risk of some cancers, like breast cancer. 12. Ditch the ice cream sundae in favor of a [place] yogurt parfait. Instead of a cup of ice cream or fro-yo for dessert, try 1 cup of nonfat Greek yogurt topped with fresh berries and a sprinkle of cacao nibs. Both toppings are packed with antioxidants, which can help reduce cellular inflammation and oxidative damage. And the comparison is a no-brainer: One cup of ice cream has about 275 calories; one cup of frozen yogurt has about 230; and a cup of Greek yogurt has just 130, plus twice the protein, so you're  less likely to return to the freezer for a second helping. 13. Put olive oil in a spray container instead of using it directly from the bottle. Each tablespoon of olive oil is 120 calories and 15g of fat. Use a mister instead of pouring it straight into the pan or onto a salad. This allows for portion control and will save you more than 100 calories. 14. When baking, substitute canned pumpkin for butter or oil. Canned pumpkin-not pumpkin pie mix-is loaded with Vitamin A, which is important for skin and eye health, as well as immunity. And the comparisons are pretty crazy:  cup of canned pumpkin has about 40 calories, compared to butter or oil, which has more than 800 calories. Yes, 800 calories. Applesauce and mashed banana can also serve as good  substitutions for butter or oil, usually in a 1:1 ratio. 15. Top casseroles with high-fiber cereal instead of breadcrumbs. Breadcrumbs are typically made with white bread, while breakfast cereals contain 5-9g of fiber per serving. Not only will you save more than 150 calories per  cup serving, the swap will also keep you more full and you'll get a metabolism boost from the added fiber. 16. Snack on pistachios instead of macadamia nuts. Believe it or not, you get the same amount of calories from 35 pistachios (100 calories) as you would from only five macadamia nuts. 17. Chow down on kale chips rather than potato chips. This is my favorite 'don't knock it 'till you try it' swap. Kale chips are so easy to make at home, and you can spice them up with a little grated parmesan or chili powder. Plus, they're a mere fraction of the calories of potato chips, but with the same crunch factor we crave so often. 18. Add seltzer and some fruit slices to your cocktail instead of soda or fruit juice. One cup of soda or fruit juice can pack on as much as 140 calories. Instead, use seltzer and fruit slices. The fruit provides valuable phytochemicals, such as flavonoids and anthocyanins, which help to combat cancer and stave off the aging process.

## 2017-05-25 ENCOUNTER — Encounter: Payer: Self-pay | Admitting: Family Medicine

## 2017-05-25 ENCOUNTER — Ambulatory Visit (INDEPENDENT_AMBULATORY_CARE_PROVIDER_SITE_OTHER): Admitting: Family Medicine

## 2017-05-25 VITALS — BP 124/87 | HR 71 | Ht 68.25 in | Wt 281.0 lb

## 2017-05-25 DIAGNOSIS — E119 Type 2 diabetes mellitus without complications: Secondary | ICD-10-CM | POA: Diagnosis not present

## 2017-05-25 MED ORDER — METFORMIN HCL ER 500 MG PO TB24: 1000.0000 mg | ORAL_TABLET | Freq: Every day | ORAL | 3 refills | Status: DC

## 2017-05-25 MED ORDER — LIRAGLUTIDE 18 MG/3ML ~~LOC~~ SOPN: 0.6000 mg | PEN_INJECTOR | Freq: Every day | SUBCUTANEOUS | 3 refills | Status: DC

## 2017-05-25 NOTE — Assessment & Plan Note (Signed)
>>  ASSESSMENT AND PLAN FOR OBESITY, CLASS III, BMI 40-49.9 (MORBID OBESITY) (HCC) WRITTEN ON 05/25/2017  8:44 AM BY OPALSKI, DEBORAH, DO  Saxenda \ liraglutide added to today's regimen.    We discussed continue lose it app to track everything she puts her mouth, using the bit to track steps-goal 5 miles daily and continue 140 ounces of water daily.  14 pound weight loss in 4 weeks is her goal.

## 2017-05-25 NOTE — Assessment & Plan Note (Signed)
Patient's blood sugars are well controlled.  She is not having any lows.  Please see history of present illness for details.  - adding liraglutide for weight loss.  Risk benefits meds discussed with patient.  She will decrease and/or eventually come off metformin if blood sugars go too low with adding liraglutide for weight loss.  Follow-up 4 weeks, patient knows to continue to monitor her blood sugars daily

## 2017-05-25 NOTE — Patient Instructions (Addendum)
Please decrease metformin to 1000 mg just at night.  If your blood sugars come down even further after starting the liraglutide, then please continue that medicine and go off your metformin.  Follow-up with me in 4 weeks for weight check and see how you're doing on the new medicine.  Add on Saxsenda 0.6 mg subcutaneous IM.  You increase every week as tolerated to a max of 3 mg per day.  Please continue to drink water, exercise to goal of 5 miles daily and track all your intake on the lose it app.  These things will help you tremendously.  Goal for next time is 5% weight loss in 4 weeks- roughly 14 pounds  Behavior Modification Ideas for Weight Management  Weight management involves adopting a healthy lifestyle that includes a knowledge of nutrition and exercise, a positive attitude and the right kind of motivation. Internal motives such as better health, increased energy, self-esteem and personal control increase your chances of lifelong weight management success.  Remember to have realistic goals and think long-term success. Believe in yourself and you can do it. The following information will give you ideas to help you meet your goals.  Control Your Home Environment  Eat only while sitting down at the kitchen or dining room table. Do not eat while watching television, reading, cooking, talking on the phone, standing at the refrigerator or working on the computer. Keep tempting foods out of the house - don't buy them. Keep tempting foods out of sight. Have low-calorie foods ready to eat. Unless you are preparing a meal, stay out of the kitchen. Have healthy snacks at your disposal, such as small pieces of fruit, vegetables, canned fruit, pretzels, low-fat string cheese and nonfat cottage cheese.  Control Your Work Environment  Do not eat at [employer] or keep tempting snacks at your desk. If you get hungry between meals, plan healthy snacks and bring them with you to work. During your  breaks, go for a walk instead of eating. If you work around food, plan in advance the one item you will eat at mealtime. Make it inconvenient to nibble on food by chewing gum, sugarless candy or drinking water or another low-calorie beverage. Do not work through meals. Skipping meals slows down metabolism and may result in overeating at the next meal. If food is available for special occasions, either pick the healthiest item, nibble on low-fat snacks brought from home, don't have anything offered, choose one option and have a small amount, or have only a beverage.  Control Your Mealtime Environment  Serve your plate of food at the stove or kitchen counter. Do not put the serving dishes on the table. If you do put dishes on the table, remove them immediately when finished eating. Fill half of your plate with vegetables, a quarter with lean protein and a quarter with starch. Use smaller plates, bowls and glasses. A smaller portion will look large when it is in a little dish. Politely refuse second helpings. When fixing your plate, limit portions of food to one scoop/serving or less.   Daily Food Management  Replace eating with another activity that you will not associate with food. Wait 20 minutes before eating something you are craving. Drink a large glass of water or diet soda before eating. Always have a big glass or bottle of water to drink throughout the day. Avoid high-calorie add-ons such as cream with your coffee, butter, mayonnaise and salad dressings.  Shopping: Do not shop when hungry  or tired. Shop from a list and avoid buying anything that is not on your list. If you must have tempting foods, buy individual-sized packages and try to find a lower-calorie alternative. Don't taste test in the store. Read food labels. Compare products to help you make the healthiest choices.  Preparation: Chew a piece of gum while cooking meals. Use a quarter teaspoon if you taste test your  food. Try to only fix what you are going to eat, leaving yourself no chance for seconds. If you have prepared more food than you need, portion it into individual containers and freeze or refrigerate immediately. Don't snack while cooking meals.  Eating: Eat slowly. Remember it takes about 20 minutes for your stomach to send a message to your brain that it is full. Don't let fake hunger make you think you need more. The ideal way to eat is to take a bite, put your utensil down, take a sip of water, cut your next bite, take a bit, put your utensil down and so on. Do not cut your food all at one time. Cut only as needed. Take small bites and chew your food well. Stop eating for a minute or two at least once during a meal or snack. Take breaks to reflect and have conversation.  Cleanup and Leftovers: Label leftovers for a specific meal or snack. Freeze or refrigerate individual portions of leftovers. Do not clean up if you are still hungry.  Eating Out and Social Eating  Do not arrive hungry. Eat something light before the meal. Try to fill up on low-calorie foods, such as vegetables and fruit, and eat smaller portions of the high-calorie foods. Eat foods that you like, but choose small portions. If you want seconds, wait at least 20 minutes after you have eaten to see if you are actually hungry or if your eyes are bigger than your stomach. Limit alcoholic beverages. Try a soda water with a twist of lime. Do not skip other meals in the day to save room for the special event.  At Restaurants: Order  la carte rather than buffet style. Order some vegetables or a salad for an appetizer instead of eating bread. If you order a high-calorie dish, share it with someone. Try an after-dinner mint with your coffee. If you do have dessert, share it with two or more people. Don't overeat because you do not want to waste food. Ask for a doggie bag to take extra food home. Tell the server to put half  of your entree in a to go bag before the meal is served to you. Ask for salad dressing, gravy or high-fat sauces on the side. Dip the tip of your fork in the dressing before each bite. If bread is served, ask for only one piece. Try it plain without butter or oil. At [employer] where oil and vinegar is served with bread, use only a small amount of oil and a lot of vinegar for dipping.  At a Friend's House: Offer to bring a dish, appetizer or dessert that is low in calories. Serve yourself small portions or tell the host that you only want a small amount. Stand or sit away from the snack table. Stay away from the kitchen or stay busy if you are near the food. Limit your alcohol intake.  At [employer] and Cafeterias: Cover most of your plate with lettuce and/or vegetables. Use a salad plate instead of a dinner plate. After eating, clear away your dishes before having  coffee or tea.  Entertaining at Home: Explore low-fat, low-cholesterol cookbooks. Use single-serving foods like chicken breasts or hamburger patties. Prepare low-calorie appetizers and desserts.   Holidays: Keep tempting foods out of sight. Decorate the house without using food. Have low-calorie beverages and foods on hand for guests. Allow yourself one planned treat a day. Don't skip meals to save up for the holiday feast. Eat regular, planned meals.   Exercise Well  Make exercise a priority and a planned activity in the day. If possible, walk the entire or part of the distance to work. Get an exercise buddy. Go for a walk with a colleague during one of your breaks, go to the gym, run or take a walk with a friend, walk in the mall with a shopping companion. Park at the end of the parking lot and walk to the store or office entrance. Always take the stairs all of the way or at least part of the way to your floor. If you have a desk job, walk around the office frequently. Do leg lifts while sitting at your  desk. Do something outside on the weekends like going for a hike or a bike ride.   Have a Healthy Attitude  Make health your weight management priority. Be realistic. Have a goal to achieve a healthier you, not necessarily the lowest weight or ideal weight based on calculations or tables. Focus on a healthy eating style, not on dieting. Dieting usually lasts for a short amount of time and rarely produces long-term success. Think long term. You are developing new healthy behaviors to follow next month, in a year and in a decade.    This information is for educational purposes only and is not intended to replace the advice of your doctor or health care provider. We encourage you to discuss with your doctor any questions or concerns you may have.        Guidelines for Losing Weight   We want weight loss that will last so you should lose 1-2 pounds a week.  THAT IS IT! Please pick THREE things a month to change. Once it is a habit check off the item. Then pick another three items off the list to become habits.  If you are already doing a habit on the list GREAT!  Cross that item off!  Don't drink your calories. Ie, alcohol, soda, fruit juice, and sweet tea.   Drink more water. Drink a glass when you feel hungry or before each meal.   Eat breakfast - Complex carb and protein (likeDannon light and fit yogurt, oatmeal, fruit, eggs, [place] bacon).  Measure your cereal.  Eat no more than one cup a day. (ie Kashi)  Eat an apple a day.  Add a vegetable a day.  Try a new vegetable a month.  Use Pam! Stop using oil or butter to cook.  Don't finish your plate or use smaller plates.  Share your dessert.  Eat sugar free Jello for dessert or frozen grapes.  Don't eat 2-3 hours before bed.  Switch to whole wheat bread, pasta, and brown rice.  Make healthier choices when you eat out. No fries!  Pick baked chicken, NOT fried.  Don't forget to SLOW DOWN when you eat. It is not going  anywhere.   Take the stairs.  Park far away in the parking lot  Lift soup cans (or weights) for 10 minutes while watching TV.  Walk at work for 10 minutes during break.  Walk outside 1  time a week with your friend, kids, dog, or significant other.  Start a walking group at church.  Walk the mall as much as you can tolerate.   Keep a food diary.  Weigh yourself daily.  Walk for 15 minutes 3 days per week.  Cook at home more often and eat out less. If life happens and you go back to old habits, it is okay.  Just start over. You can do it!  If you experience chest pain, get short of breath, or tired during the exercise, please stop immediately and inform your doctor.    Before you even begin to attack a weight-loss plan, it pays to remember this: You are not fat. You have fat. Losing weight isn't about blame or shame; it's simply another achievement to accomplish. Dieting is like any other skill-you have to buckle down and work at it. As long as you act in a smart, reasonable way, you'll ultimately get where you want to be. Here are some weight loss pearls for you.   1. It's Not a Diet. It's a Lifestyle Thinking of a diet as something you're on and suffering through only for the short term doesn't work. To shed weight and keep it off, you need to make permanent changes to the way you eat. It's OK to indulge occasionally, of course, but if you cut calories temporarily and then revert to your old way of eating, you'll gain back the weight quicker than you can say yo-yo. Use it to lose it. Research shows that one of the best predictors of long-term weight loss is how many pounds you drop in the first month. For that reason, nutritionists often suggest being stricter for the first two weeks of your new eating strategy to build momentum. Cut out added sugar and alcohol and avoid unrefined carbs. After that, figure out how you can reincorporate them in a way that's healthy and maintainable.   2. There's a Right Way to Exercise Working out burns calories and fat and boosts your metabolism by building muscle. But those trying to lose weight are notorious for overestimating the number of calories they burn and underestimating the amount they take in. Unfortunately, your system is biologically programmed to hold on to extra pounds and that means when you start exercising, your body senses the deficit and ramps up its hunger signals. If you're not diligent, you'll eat everything you burn and then some. Use it, to lose it. Cardio gets all the exercise glory, but strength and interval training are the real heroes. They help you build lean muscle, which in turn increases your metabolism and calorie-burning ability 3. Don't Overreact to Mild Hunger Some people have a hard time losing weight because of hunger anxiety. To them, being hungry is bad-something to be avoided at all costs-so they carry snacks with them and eat when they don't need to. Others eat because they're stressed out or bored. While you never want to get to the point of being ravenous (that's when bingeing is likely to happen), a hunger pang, a craving, or the fact that it's 3:00 p.m. should not send you racing for the vending machine or obsessing about the energy bar in your purse. Ideally, you should put off eating until your stomach is growling and it's difficult to concentrate.  Use it to lose it. When you feel the urge to eat, use the HALT method. Ask yourself, Am I really hungry? Or am I angry or anxious, lonely or bored, or  tired? If you're still not certain, try the apple test. If you're truly hungry, an apple should seem delicious; if it doesn't, something else is going on. Or you can try drinking water and making yourself busy, if you are still hungry try a healthy snack.  4. Not All Calories Are Created Equal The mechanics of weight loss are pretty simple: Take in fewer calories than you use for energy. But the kind of food  you eat makes all the difference. Processed food that's high in saturated fat and refined starch or sugar can cause inflammation that disrupts the hormone signals that tell your brain you're full. The result: You eat a lot more.  Use it to lose it. Clean up your diet. Swap in whole, unprocessed foods, including vegetables, lean protein, and healthy fats that will fill you up and give you the biggest nutritional bang for your calorie buck. In a few weeks, as your brain starts receiving regular hunger and fullness signals once again, you'll notice that you feel less hungry overall and naturally start cutting back on the amount you eat.  5. Protein, Produce, and Plant-Based Fats Are Your Weight-Loss Trinity Here's why eating the three Ps regularly will help you drop pounds. Protein fills you up. You need it to build lean muscle, which keeps your metabolism humming so that you can torch more fat. People in a weight-loss program who ate double the recommended daily allowance for protein (about 110 grams for a 150-pound woman) lost 70 percent of their weight from fat, while people who ate the RDA lost only about 40 percent, one study found. Produce is packed with filling fiber. "It's very difficult to consume too many calories if you're eating a lot of vegetables. Example: Three cups of broccoli is a lot of food, yet only 93 calories. (Fruit is another story. It can be easy to overeat and can contain a lot of calories from sugar, so be sure to monitor your intake.) Plant-based fats like olive oil and those in avocados and nuts are healthy and extra satiating.  Use it to lose it. Aim to incorporate each of the three Ps into every meal and snack. People who eat protein throughout the day are able to keep weight off, according to a study in the [place] of Clinical Nutrition. In addition to meat, poultry and seafood, good sources are beans, lentils, eggs, tofu, and yogurt. As for fat, keep portion sizes in  check by measuring out salad dressing, oil, and nut butters (shoot for one to two tablespoons). Finally, eat veggies or a little fruit at every meal. People who did that consumed 308 fewer calories but didn't feel any hungrier than when they didn't eat more produce.  7. How You Eat Is As Important As What You Eat In order for your brain to register that you're full, you need to focus on what you're eating. Sit down whenever you eat, preferably at a table. Turn off the TV or computer, put down your phone, and look at your food. Smell it. Chew slowly, and don't put another bite on your fork until you swallow. When women ate lunch this attentively, they consumed 30 percent less when snacking later than those who listened to an audiobook at lunchtime, according to a study in the [place] of Nutrition. 8. Weighing Yourself Really Works The scale provides the best evidence about whether your efforts are paying off. Seeing the numbers tick up or down or stagnate is motivation to keep  going-or to rethink your approach. A 2015 study at [facility] found that daily weigh-ins helped people lose more weight, keep it off, and maintain that loss, even after two years. Use it to lose it. Step on the scale at the same time every day for the best results. If your weight shoots up several pounds from one weigh-in to the next, don't freak out. Eating a lot of salt the night before or having your period is the likely culprit. The number should return to normal in a day or two. It's a steady climb that you need to do something about. 9. Too Much Stress and Too Little Sleep Are Your Enemies When you're tired and frazzled, your body cranks up the production of cortisol, the stress hormone that can cause carb cravings. Not getting enough sleep also boosts your levels of ghrelin, a hormone associated with hunger, while suppressing leptin, a hormone that signals fullness and satiety. People on a diet who slept only five  and a half hours a night for two weeks lost 55 percent less fat and were hungrier than those who slept eight and a half hours, according to a study in the [place]. Use it to lose it. Prioritize sleep, aiming for seven hours or more a night, which research shows helps lower stress. And make sure you're getting quality zzz's. If a snoring spouse or a fidgety cat wakes you up frequently throughout the night, you may end up getting the equivalent of just four hours of sleep, according to a study from [facility]. Keep pets out of the bedroom, and use a white-noise app to drown out snoring. 10. You Will Hit a plateau-And You Can Bust Through It As you slim down, your body releases much less leptin, the fullness hormone.  If you're not strength training, start right now. Building muscle can raise your metabolism to help you overcome a plateau. To keep your body challenged and burning calories, incorporate new moves and more intense intervals into your workouts or add another sweat session to your weekly routine. Alternatively, cut an extra 100 calories or so a day from your diet. Now that you've lost weight, your body simply doesn't need as much fuel.    Since food equals calories, in order to lose weight you must either eat fewer calories, exercise more to burn off calories with activity, or both. Food that is not used to fuel the body is stored as fat. A major component of losing weight is to make smarter food choices. Here's how:  1)   Limit non-nutritious foods, such as: Sugar, honey, syrups and candy Pastries, donuts, pies, cakes and cookies Soft drinks, sweetened juices and alcoholic beverages  2)  Cut down on high-fat foods by: - Choosing poultry, fish or lean red meat - Choosing low-fat cooking methods, such as baking, broiling, steaming, grilling and boiling - Using low-fat or non-fat dairy products - Using vinaigrette, herbs, lemon or fat-free salad  dressings - Avoiding fatty meats, such as bacon, sausage, franks, ribs and luncheon meats - Avoiding high-fat snacks like nuts, chips and chocolate - Avoiding fried foods - Using less butter, margarine, oil and mayonnaise - Avoiding high-fat gravies, cream sauces and cream-based soups  3) Eat a variety of foods, including: - Fruit and vegetables that are raw, steamed or baked - Whole grains, breads, cereal, rice and pasta - Dairy products, such as low-fat or non-fat milk or yogurt, low-fat cottage cheese and low-fat cheese - Protein-rich  foods like chicken, [place], fish, lean meat and legumes, or beans  4) Change your eating habits by: - Eat three balanced meals a day to help control your hunger - Watch portion sizes and eat small servings of a variety of foods - Choose low-calorie snacks - Eat only when you are hungry and stop when you are satisfied - Eat slowly and try not to perform other tasks while eating - Find other activities to distract you from food, such as walking, taking up a hobby or being involved in the community - Include regular exercise in your daily routine ( minimum of 20 min of moderate-intensity exercise at least 5 days/week)  - Find a support group, if necessary, for emotional support in your weight loss journey           Easy ways to cut 100 calories   1. Eat your eggs with hot sauce OR salsa instead of cheese.  Eggs are great for breakfast, but many people consider eggs and cheese to be BFFs. Instead of cheese-1 oz. of cheddar has 114 calories-top your eggs with hot sauce, which contains no calories and helps with satiety and metabolism. Salsa is also a great option!!  2. Top your toast, waffles or pancakes with fresh berries instead of jelly or syrup. Half a cup of berries-fresh, frozen or thawed-has about 40 calories, compared with 2 tbsp. of maple syrup or jelly, which both have about 100 calories. The berries will also give you a good punch of fiber,  which helps keep you full and satisfied and won't spike blood sugar quickly like the jelly or syrup. 3. Swap the non-fat latte for black coffee with a splash of half-and-half. Contrary to its name, that non-fat latte has 130 calories and a startling 19g of carbohydrates per 16 oz. serving. Replacing that 'light' drinkable dessert with a black coffee with a splash of half-and-half saves you more than 100 calories per 16 oz. serving. 4. Sprinkle salads with freeze-dried raspberries instead of dried cranberries. If you want a sweet addition to your nutritious salad, stay away from dried cranberries. They have a whopping 130 calories per  cup and 30g carbohydrates. Instead, sprinkle freeze-dried raspberries guilt-free and save more than 100 calories per  cup serving, adding 3g of belly-filling fiber. 5. Go for mustard in place of mayo on your sandwich. Mustard can add really nice flavor to any sandwich, and there are tons of varieties, from spicy to honey. A serving of mayo is 95 calories, versus 10 calories in a serving of mustard.  Or try an avocado mayo spread: You can find the recipe few click this link: https://www.californiaavocado.com/recipes/recipe-container/[place]-avocado-mayo 6. Choose a DIY salad dressing instead of the store-bought kind. Mix Dijon or whole grain mustard with low-fat Kefir or red wine vinegar and garlic. 7. Use hummus as a spread instead of a dip. Use hummus as a spread on a high-fiber cracker or tortilla with a sandwich and save on calories without sacrificing taste. 8. Pick just one salad "accessory." Salad isn't automatically a calorie winner. It's easy to over-accessorize with toppings. Instead of topping your salad with nuts, avocado and cranberries (all three will clock in at 313 calories), just pick one. The next day, choose a different accessory, which will also keep your salad interesting. You don't wear all your jewelry every day, right? 9. Ditch the white pasta  in favor of spaghetti squash. One cup of cooked spaghetti squash has about 40 calories, compared with traditional spaghetti, which comes  with more than 200. Spaghetti squash is also nutrient-dense. It's a good source of fiber and Vitamins A and C, and it can be eaten just like you would eat pasta-with a great tomato sauce and [place] meatballs or with pesto, tofu and spinach, for example. 10. Dress up your chili, soups and stews with non-fat [place] yogurt instead of sour cream. Just a 'dollop' of sour cream can set you back 115 calories and a whopping 12g of fat-seven of which are of the artery-clogging variety. Added bonus: [place] yogurt is packed with muscle-building protein, calcium and B Vitamins. 11. Mash cauliflower instead of mashed potatoes. One cup of traditional mashed potatoes-in all their creamy goodness-has more than 200 calories, compared to mashed cauliflower, which you can typically eat for less than 100 calories per 1 cup serving. Cauliflower is a great source of the antioxidant indole-3-carbinol (I3C), which may help reduce the risk of some cancers, like breast cancer. 12. Ditch the ice cream sundae in favor of a [place] yogurt parfait. Instead of a cup of ice cream or fro-yo for dessert, try 1 cup of nonfat Greek yogurt topped with fresh berries and a sprinkle of cacao nibs. Both toppings are packed with antioxidants, which can help reduce cellular inflammation and oxidative damage. And the comparison is a no-brainer: One cup of ice cream has about 275 calories; one cup of frozen yogurt has about 230; and a cup of Greek yogurt has just 130, plus twice the protein, so you're less likely to return to the freezer for a second helping. 13. Put olive oil in a spray container instead of using it directly from the bottle. Each tablespoon of olive oil is 120 calories and 15g of fat. Use a mister instead of pouring it straight into the pan or onto a salad. This allows for portion control and will save  you more than 100 calories. 14. When baking, substitute canned pumpkin for butter or oil. Canned pumpkin-not pumpkin pie mix-is loaded with Vitamin A, which is important for skin and eye health, as well as immunity. And the comparisons are pretty crazy:  cup of canned pumpkin has about 40 calories, compared to butter or oil, which has more than 800 calories. Yes, 800 calories. Applesauce and mashed banana can also serve as good substitutions for butter or oil, usually in a 1:1 ratio. 15. Top casseroles with high-fiber cereal instead of breadcrumbs. Breadcrumbs are typically made with white bread, while breakfast cereals contain 5-9g of fiber per serving. Not only will you save more than 150 calories per  cup serving, the swap will also keep you more full and you'll get a metabolism boost from the added fiber. 16. Snack on pistachios instead of macadamia nuts. Believe it or not, you get the same amount of calories from 35 pistachios (100 calories) as you would from only five macadamia nuts. 17. Chow down on kale chips rather than potato chips. This is my favorite 'don't knock it 'till you try it' swap. Kale chips are so easy to make at home, and you can spice them up with a little grated parmesan or chili powder. Plus, they're a mere fraction of the calories of potato chips, but with the same crunch factor we crave so often. 18. Add seltzer and some fruit slices to your cocktail instead of soda or fruit juice. One cup of soda or fruit juice can pack on as much as 140 calories. Instead, use seltzer and fruit slices. The fruit provides valuable phytochemicals, such as  flavonoids and anthocyanins, which help to combat cancer and stave off the aging process.

## 2017-05-25 NOTE — Assessment & Plan Note (Signed)
Saxenda \ liraglutide added to today's regimen.    We discussed continue lose it app to track everything she puts her mouth, using the bit to track steps-goal 5 miles daily and continue 140 ounces of water daily.  14 pound weight loss in 4 weeks is her goal.

## 2017-05-25 NOTE — Progress Notes (Signed)
Wt loss OV note   Impression and Recommendations:    1. Diabetes mellitus without complication (HCC)   2. Obesity, Class III, BMI 40-49.9 (morbid obesity) ([place])      Type II Diabetes mellitus without complication ([place]) Patient's blood sugars are well controlled.  She is not having any lows.  Please see history of present illness for details.  - adding liraglutide for weight loss.  Risk benefits meds discussed with patient.  She will decrease and/or eventually come off metformin if blood sugars go too low with adding liraglutide for weight loss.  Follow-up 4 weeks, patient knows to continue to monitor her blood sugars daily  Obesity, Class III, BMI 40-49.9 (morbid obesity) ([place]) Saxenda \ liraglutide added to today's regimen.    We discussed continue lose it app to track everything she puts her mouth, using the bit to track steps-goal 5 miles daily and continue 140 ounces of water daily.  14 pound weight loss in 4 weeks is her goal.  The patient was counseled, risk factors were discussed, anticipatory guidance given.   -Gross side effects, risk and benefits, and alternatives of medications discussed with patient.  Patient is aware that all medications have potential side effects and we are unable to predict every side effect or drug-drug interaction that may occur.  Expresses verbal understanding and consents to current therapy plan and treatment regimen.  Meds ordered this encounter  Medications  . liraglutide 18 MG/3ML SOPN    Sig: Inject 0.1 mLs (0.6 mg total) into the skin daily. Inc 0.6 weekly as tol  to a max of 3 mg/d    Dispense:  6 pen    Refill:  3  . metFORMIN (GLUCOPHAGE-XR) 500 MG 24 hr tablet    Sig: Take 2 tablets (1,000 mg total) by mouth at bedtime.    Dispense:  180 tablet    Refill:  3     Discontinued Medications   TRAZODONE (DESYREL) 100 MG TABLET    1 tabs nightly    Return in about 4 weeks (around 06/22/2017) for 4 weeks goal is roughly 14  pounds weight loss.   Please see AVS handed out to patient at the end of our visit for further patient instructions/ counseling done pertaining to today's office visit.    Note: This document was prepared using Dragon voice recognition software and may include unintentional dictation errors.  [name] 8:52 AM   ______________________________________________________________________    Subjective:  HPI: Rachel Simmons 51 y.o. female  presents for 3 month follow up for multiple medical problems.   -- Wt loss: Pt has been doing well with wt loss so far.    Walking 5 miles at work daily.  Using the lose it app and has been track everything she eats. Drinking 145oz water per day on ave.   Since November 2017 patient was 310 pounds at that time.  She has lost over 30 pounds  -- Diabetes mellitus: BS- FBS's lowest 89 and highest 118.  Patient asymptomatic.  No hyper or hypoglycemic episodes.  Feeling good about her being under better control.  Lab Results  Component Value Date   HGBA1C 7.6 04/20/2017   HGBA1C 7.7 08/18/2016    Weight:  Wt Readings from Last 3 Encounters:  05/25/17 281 lb (127.5 kg)  04/20/17 289 lb (131.1 kg)  11/15/16 297 lb 4.8 oz (134.9 kg)   BMI Readings from Last 3 Encounters:  05/25/17 42.41 kg/m  04/20/17 43.62 kg/m  11/15/16 44.87 kg/m   Lab Results  Component Value Date   HGBA1C 7.6 04/20/2017   HGBA1C 7.7 08/18/2016    Review of Systems: General:   No F/C, wt loss Pulm:   No DIB, SOB, pleuritic chest pain Card:  No CP, palpitations Abd:  No n/v/d or pain Ext:  No inc edema from baseline   Objective: Physical Exam: BP 124/87   Pulse 71   Ht 5' 8.25" (1.734 m)   Wt 281 lb (127.5 kg)   BMI 42.41 kg/m  Body mass index is 42.41 kg/m. General: Well nourished, in no apparent distress. Eyes: PERRLA, EOMs, conjunctiva clr no swelling or erythema ENT/Mouth: Hearing appears normal.  Mucus Membranes Moist  Neck: Supple, no  masses Resp: Respiratory effort- normal, ECTA B/L w/o W/R/R  Cardio: RRR w/o MRGs. Abdomen: no gross distention. Lymphatics:  Brisk peripheral pulses, less 2 sec cap RF, no gross edema  M-sk: Full ROM, 5/5 strength, normal gait.  Skin: Warm, dry without rashes, lesions, ecchymosis.  Neuro: Alert, Oriented Psych: Normal affect, Insight and Judgment appropriate.    Current Medications:  Current Outpatient Prescriptions on File Prior to Visit  Medication Sig Dispense Refill  . aspirin 81 MG chewable tablet Chew 81 mg by mouth daily.    Marland Kitchen atorvastatin (LIPITOR) 20 MG tablet Take 1 tablet (20 mg total) by mouth at bedtime. 90 tablet 3  . calcium carbonate (CALCIUM 600) 600 MG TABS tablet Take 1 tablet by mouth daily.    . cetirizine (ZYRTEC) 5 MG tablet Take 1 tablet by mouth daily.    . Cholecalciferol (VITAMIN D3) 5000 units TABS 5,000 IU OTC vitamin D3 daily. 90 tablet 121  . escitalopram (LEXAPRO) 20 MG tablet Take 1 tablet (20 mg total) by mouth daily. 90 tablet 3  . gabapentin (NEURONTIN) 300 MG capsule Take 1 capsule (300 mg total) by mouth 3 (three) times daily. 90 capsule 5  . IRON PO Take 65 mg by mouth daily.    Marland Kitchen levothyroxine (SYNTHROID, LEVOTHROID) 100 MCG tablet Take 1 tablet (100 mcg total) by mouth daily. 90 tablet 3  . lisinopril-hydrochlorothiazide (PRINZIDE,ZESTORETIC) 20-25 MG tablet Take 1 tablet by mouth daily. 90 tablet 3  . Omega-3 Fatty Acids (FISH OIL) 1200 MG CAPS Take 4-5 tabs daily    . Vitamin D, Ergocalciferol, (DRISDOL) 50000 units CAPS capsule Take 1 capsule (50,000 Units total) by mouth every 7 (seven) days. 12 capsule 20   No current facility-administered medications on file prior to visit.     Medical History:  Patient Active Problem List   Diagnosis Date Noted  . Obesity, Class III, BMI 40-49.9 (morbid obesity) ([place]) 08/20/2016    Priority: High  . Type II Diabetes mellitus without complication ([place]) 17/40/8144    Priority: High  . Hypertension  08/18/2016    Priority: High  . Thyroid disease 08/18/2016    Priority: High  . Diabetic peripheral neuropathy ([place]) 04/21/2015    Priority: High  . Hypertriglyceridemia 10/18/2016    Priority: Medium  . Insomnia 10/18/2016    Priority: Medium  . 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 Adjustment disorder with mixed anxiety and depressed mood 08/20/2016    Priority: Medium  . Chronic lymphoblastic leukemia ([place]) 08/18/2016    Priority: Low  . Family history of breast cancer     Priority: Low  . Family history of colon cancer- brother age 89     Priority: Low  . Family  history of colon cancer requiring screening colonoscopy 08/20/2016  . Chronic back pain greater than 3 months duration 08/18/2016  . Genetic testing 05/21/2015  . Family history of polyps in the colon     Allergies:  No Known Allergies   Family history-  Reviewed; changed as appropriate  Social history-  Reviewed; changed as appropriate

## 2017-06-21 ENCOUNTER — Telehealth: Payer: Self-pay | Admitting: Family Medicine

## 2017-06-21 DIAGNOSIS — E119 Type 2 diabetes mellitus without complications: Secondary | ICD-10-CM

## 2017-06-21 MED ORDER — METFORMIN HCL ER 500 MG PO TB24: 1000.0000 mg | ORAL_TABLET | Freq: Every day | ORAL | 3 refills | Status: DC

## 2017-06-21 MED ORDER — LIRAGLUTIDE 18 MG/3ML ~~LOC~~ SOPN: 0.6000 mg | PEN_INJECTOR | Freq: Every day | SUBCUTANEOUS | 3 refills | Status: DC

## 2017-06-21 NOTE — Telephone Encounter (Signed)
Metformin and [facility]ide [facility]

## 2017-06-21 NOTE — Telephone Encounter (Signed)
Patient called stating her last two meds were sent to the wrong pharmacy (liraglutide and metformin). They need to go to [facility] not [facility].

## 2017-06-21 NOTE — Addendum Note (Signed)
Addended by: Amado Coe on: 06/21/2017 02:47 PM   Modules accepted: Orders

## 2017-06-27 ENCOUNTER — Telehealth: Payer: Self-pay | Admitting: Family Medicine

## 2017-06-27 NOTE — Telephone Encounter (Signed)
Spoke to the patient - medication was sent to [facility] this morning.  MPulliam, CMA/RT(R)

## 2017-06-27 NOTE — Telephone Encounter (Signed)
Called patient left message for patient to call the office back.  MPulliam, CMA/RT(R)  

## 2017-06-27 NOTE — Telephone Encounter (Signed)
Patient has some questions about the new med that she is starting on.

## 2017-06-28 ENCOUNTER — Telehealth: Payer: Self-pay

## 2017-06-28 NOTE — Telephone Encounter (Signed)
[facility] that liraglutide was intended to be saxenda for weight loss.

## 2017-06-29 ENCOUNTER — Ambulatory Visit: Admitting: Family Medicine

## 2017-06-30 ENCOUNTER — Other Ambulatory Visit: Payer: Self-pay | Admitting: Family Medicine

## 2017-06-30 ENCOUNTER — Other Ambulatory Visit: Payer: Self-pay | Admitting: Adult Health

## 2017-07-13 ENCOUNTER — Ambulatory Visit (INDEPENDENT_AMBULATORY_CARE_PROVIDER_SITE_OTHER): Admitting: Family Medicine

## 2017-07-13 ENCOUNTER — Encounter: Payer: Self-pay | Admitting: Family Medicine

## 2017-07-13 VITALS — BP 129/88 | HR 76 | Ht 68.25 in | Wt 266.3 lb

## 2017-07-13 DIAGNOSIS — E1142 Type 2 diabetes mellitus with diabetic polyneuropathy: Secondary | ICD-10-CM

## 2017-07-13 DIAGNOSIS — E119 Type 2 diabetes mellitus without complications: Secondary | ICD-10-CM

## 2017-07-13 MED ORDER — GLUCOSE BLOOD VI STRP: ORAL_STRIP | 3 refills | Status: DC

## 2017-07-13 MED ORDER — METFORMIN HCL ER 500 MG PO TB24: 500.0000 mg | ORAL_TABLET | Freq: Every day | ORAL | 3 refills | Status: DC

## 2017-07-13 NOTE — Progress Notes (Signed)
Impression and Recommendations:    1. Obesity, Class III, BMI 40-49.9 (morbid obesity) ([place])   2. Diabetes mellitus without complication ([place])   3. Diabetic peripheral neuropathy ([place])   4. Type II Diabetes mellitus without complication (HCC)    Go down to just the 500 mg of the metformin nightly and go up to 1.2 mg of the SaxSenda daily.  Continue to use the lose it app as you're doing fabulous With your weight loss.  One hour a day and watch her to focus on yourself.  That could be meditation, walking, hiking reading etc., preferably something physical just for you.   No problem-specific Assessment & Plan notes found for this encounter.   The patient was counseled, risk factors were discussed, anticipatory guidance given.   New Prescriptions   GLUCOSE BLOOD TEST STRIP    (Freestyle lite) check FBS and 2 hr PP     Discontinued Medications   SAXENDA 18 MG/3ML SOPN    Inject into the skin daily.     Modified Medications   Modified Medication Previous Medication   METFORMIN (GLUCOPHAGE-XR) 500 MG 24 HR TABLET metFORMIN (GLUCOPHAGE-XR) 500 MG 24 hr tablet      Take 1 tablet (500 mg total) by mouth at bedtime.    Take 2 tablets (1,000 mg total) by mouth at bedtime.      Orders Placed This Encounter  Procedures  . Hemoglobin A1c     Gross side effects, risk and benefits, and alternatives of medications and treatment plan in general discussed with patient.  Patient is aware that all medications have potential side effects and we are unable to predict every side effect or drug-drug interaction that may occur.   Patient will call with any questions prior to using medication if they have concerns.  Expresses verbal understanding and consents to current therapy and treatment regimen.  No barriers to understanding were identified.  Red flag symptoms and signs discussed in detail.  Patient expressed understanding regarding what to do in case of emergency\urgent  symptoms  Please see AVS handed out to patient at the end of our visit for further patient instructions/ counseling done pertaining to today's office visit.   Return for Follow-up appointment as planned in near future for fasting blood work..     Note: This document was prepared using Dragon voice recognition software and may include unintentional dictation errors.  [name] 9:53 AM --------------------------------------------------------------------------------------------------------------------------------------------------------------------------------------------------------------------------------------------    Subjective:    CC:  Chief Complaint  Patient presents with  . Follow-up    HPI: Rachel Simmons is a 50 y.o. female who presents to [place] [facility] today for issues as discussed below.  Total wt loss from 3 mo ago--> she was at 320lbs--> now 266lbs---> 50 lbs since we started   310 lbs on 10/2016  And 289lbs on 04/20/17 and now today our scales is 266.   Only using lose it app.  Follows it religiously and walking 5.59miles/day on her fit bit.   Just started saxenda about 1 wk ago- tolerating it well.  Not as hungry on it.   FBS-- highest is 113, and lowest has been 97 past 3 wks-->   No problems updated.   Wt Readings from Last 3 Encounters:  07/13/17 266 lb 4.8 oz (120.8 kg)  05/25/17 281 lb (127.5 kg)  04/20/17 289 lb (131.1 kg)   BP Readings from Last 3 Encounters:  07/13/17 129/88  05/25/17 124/87  04/20/17 113/79   Pulse Readings from Last 3 Encounters:  07/13/17 76  05/25/17 71  04/20/17 74   BMI Readings from Last 3 Encounters:  07/13/17 40.19 kg/m  05/25/17 42.41 kg/m  04/20/17 43.62 kg/m     Patient Care Team    Relationship Specialty Notifications Start End  [name], DO PCP - General Family Medicine  08/18/16      Patient Active Problem List   Diagnosis Date Noted  . Obesity, Class III,  BMI 40-49.9 (morbid obesity) ([place]) 08/20/2016    Priority: High  . Type II Diabetes mellitus without complication ([place]) 26/20/3559    Priority: High  . Hypertension 08/18/2016    Priority: High  . Thyroid disease 08/18/2016    Priority: High  . Diabetic peripheral neuropathy ([place]) 04/21/2015    Priority: High  . Hypertriglyceridemia 10/18/2016    Priority: Medium  . Insomnia 10/18/2016    Priority: Medium  . 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 Adjustment disorder with mixed anxiety and depressed mood 08/20/2016    Priority: Medium  . Chronic lymphoblastic leukemia ([place]) 08/18/2016    Priority: Low  . Family history of breast cancer     Priority: Low  . Family history of colon cancer- brother age 79     Priority: Low  . Family history of colon cancer requiring screening colonoscopy 08/20/2016  . Chronic back pain greater than 3 months duration 08/18/2016  . Genetic testing 05/21/2015  . Family history of polyps in the colon     Past Medical history, Surgical history, Family history, Social history, Allergies and Medications have been entered into the medical record, reviewed and changed as needed.    No outpatient prescriptions have been marked as taking for the 07/13/17 encounter (Office Visit) with [name], DO.    Allergies:  No Known Allergies   Review of Systems: General:   Denies fever, chills, unexplained weight loss.  Optho/Auditory:   Denies visual changes, blurred vision/LOV Respiratory:   Denies wheeze, DOE more than baseline levels.  Cardiovascular:   Denies chest pain, palpitations, new onset peripheral edema  Gastrointestinal:   Denies nausea, vomiting, diarrhea, abd pain.  Genitourinary: Denies dysuria, freq/ urgency, flank pain or discharge from genitals.  Endocrine:     Denies hot or cold intolerance, polyuria, polydipsia. Musculoskeletal:   Denies unexplained myalgias, joint swelling, unexplained arthralgias, gait  problems.  Skin:  Denies new onset rash, suspicious lesions Neurological:     Denies dizziness, unexplained weakness, numbness  Psychiatric/Behavioral:   Denies mood changes, suicidal or homicidal ideations, hallucinations    Objective:   Blood pressure 129/88, pulse 76, height 5' 8.25" (1.734 m), weight 266 lb 4.8 oz (120.8 kg). Body mass index is 40.19 kg/m. General:  Well Developed, well nourished, appropriate for stated age.  Neuro:  Alert and oriented,  extra-ocular muscles intact  HEENT:  Normocephalic, atraumatic, neck supple, no carotid bruits appreciated  Skin:  no gross rash, warm, pink. Cardiac:  RRR, S1 S2 Respiratory:  ECTA B/L and A/P, Not using accessory muscles, speaking in full sentences- unlabored. Vascular:  Ext warm, no cyanosis apprec.; cap RF less 2 sec. Psych:  No HI/SI, judgement and insight good, Euthymic mood. Full Affect.

## 2017-07-13 NOTE — Patient Instructions (Signed)
Go down to just the 500 mg of the metformin nightly and go up to 1.2 mg of the SaxSenda daily.  Continue to use the lose it app as you're doing fabulous  One hour a day and watch her to focus on yourself.  That could be meditation, walking, hiking reading etc., preferably something physical just for you.

## 2017-07-25 ENCOUNTER — Ambulatory Visit: Admitting: Family Medicine

## 2017-08-08 ENCOUNTER — Ambulatory Visit: Admitting: Family Medicine

## 2017-08-09 ENCOUNTER — Other Ambulatory Visit (INDEPENDENT_AMBULATORY_CARE_PROVIDER_SITE_OTHER)

## 2017-08-09 DIAGNOSIS — F5102 Adjustment insomnia: Secondary | ICD-10-CM

## 2017-08-09 DIAGNOSIS — E079 Disorder of thyroid, unspecified: Secondary | ICD-10-CM

## 2017-08-09 DIAGNOSIS — E781 Pure hyperglyceridemia: Secondary | ICD-10-CM

## 2017-08-09 DIAGNOSIS — I10 Essential (primary) hypertension: Secondary | ICD-10-CM

## 2017-08-09 DIAGNOSIS — F4323 Adjustment disorder with mixed anxiety and depressed mood: Secondary | ICD-10-CM

## 2017-08-09 DIAGNOSIS — E119 Type 2 diabetes mellitus without complications: Secondary | ICD-10-CM

## 2017-08-10 LAB — CBC WITH DIFFERENTIAL/PLATELET
BASOS: 0 %
Basophils Absolute: 0.1 10*3/uL (ref 0.0–0.2)
EOS (ABSOLUTE): 0.3 10*3/uL (ref 0.0–0.4)
Eos: 1 %
HEMATOCRIT: 38.7 % (ref 34.0–46.6)
Hemoglobin: 12.8 g/dL (ref 11.1–15.9)
IMMATURE GRANULOCYTES: 0 %
Immature Grans (Abs): 0 10*3/uL (ref 0.0–0.1)
LYMPHS ABS: 23.9 10*3/uL — AB (ref 0.7–3.1)
Lymphs: 81 %
MCH: 28.6 pg (ref 26.6–33.0)
MCHC: 33.1 g/dL (ref 31.5–35.7)
MCV: 86 fL (ref 79–97)
MONOS ABS: 0.8 10*3/uL (ref 0.1–0.9)
Monocytes: 3 %
NEUTROS PCT: 15 %
Neutrophils Absolute: 4.5 10*3/uL (ref 1.4–7.0)
Platelets: 286 10*3/uL (ref 150–379)
RBC: 4.48 x10E6/uL (ref 3.77–5.28)
RDW: 14.2 % (ref 12.3–15.4)
WBC: 29.6 10*3/uL — AB (ref 3.4–10.8)

## 2017-08-10 LAB — LIPID PANEL
CHOL/HDL RATIO: 2.2 ratio (ref 0.0–4.4)
Cholesterol, Total: 91 mg/dL — ABNORMAL LOW (ref 100–199)
HDL: 42 mg/dL (ref >39)
LDL Calculated: 21 mg/dL (ref 0–99)
Triglycerides: 138 mg/dL (ref 0–149)
VLDL Cholesterol Cal: 28 mg/dL (ref 5–40)

## 2017-08-10 LAB — VITAMIN B12: VITAMIN B 12: 978 pg/mL (ref 232–1245)

## 2017-08-10 LAB — HEMOGLOBIN A1C
Est. average glucose Bld gHb Est-mCnc: 131 mg/dL
HEMOGLOBIN A1C: 6.2 % — AB (ref 4.8–5.6)

## 2017-08-10 LAB — VITAMIN D 25 HYDROXY (VIT D DEFICIENCY, FRACTURES): VIT D 25 HYDROXY: 81.8 ng/mL (ref 30.0–100.0)

## 2017-08-10 LAB — MAGNESIUM: MAGNESIUM: 1.6 mg/dL (ref 1.6–2.3)

## 2017-08-10 LAB — PHOSPHORUS: PHOSPHORUS: 3.9 mg/dL (ref 2.5–4.5)

## 2017-08-10 LAB — TSH: TSH: 7.41 u[IU]/mL — ABNORMAL HIGH (ref 0.450–4.500)

## 2017-08-22 ENCOUNTER — Ambulatory Visit: Admitting: Family Medicine

## 2017-08-29 ENCOUNTER — Ambulatory Visit (INDEPENDENT_AMBULATORY_CARE_PROVIDER_SITE_OTHER): Admitting: Family Medicine

## 2017-08-29 ENCOUNTER — Encounter: Payer: Self-pay | Admitting: Family Medicine

## 2017-08-29 VITALS — BP 128/86 | HR 73 | Ht 68.25 in | Wt 259.3 lb

## 2017-08-29 DIAGNOSIS — E039 Hypothyroidism, unspecified: Secondary | ICD-10-CM

## 2017-08-29 DIAGNOSIS — E119 Type 2 diabetes mellitus without complications: Secondary | ICD-10-CM | POA: Diagnosis not present

## 2017-08-29 DIAGNOSIS — E785 Hyperlipidemia, unspecified: Secondary | ICD-10-CM

## 2017-08-29 DIAGNOSIS — H9192 Unspecified hearing loss, left ear: Secondary | ICD-10-CM

## 2017-08-29 DIAGNOSIS — E1169 Type 2 diabetes mellitus with other specified complication: Secondary | ICD-10-CM

## 2017-08-29 LAB — POCT UA - MICROALBUMIN
Albumin/Creatinine Ratio, Urine, POC: <30
Creatinine, POC: 300 mg/dL
MICROALBUMIN (UR) POC: 80 mg/L

## 2017-08-29 MED ORDER — LEVOTHYROXINE SODIUM 112 MCG PO TABS: 112.0000 ug | ORAL_TABLET | Freq: Every day | ORAL | 1 refills | Status: DC

## 2017-08-29 MED ORDER — LEVOTHYROXINE SODIUM 25 MCG PO TABS: ORAL_TABLET | ORAL | 0 refills | Status: DC

## 2017-08-29 MED ORDER — ATORVASTATIN CALCIUM 20 MG PO TABS: 10.0000 mg | ORAL_TABLET | Freq: Every day | ORAL | 3 refills | Status: DC

## 2017-08-29 NOTE — Progress Notes (Signed)
Impression and Recommendations:    1. Type II Diabetes mellitus without complication ([place])   2. Hyperlipidemia associated with type 2 diabetes mellitus ([place])   3. Hypothyroidism, unspecified type   4. Acute hearing loss, left ear- cerumen impaction     Type II Diabetes mellitus without complication (HCC) - Plan: POCT UA - Microalbumin  Hyperlipidemia associated with type 2 diabetes mellitus ([place]) - Plan: atorvastatin (LIPITOR) 20 MG tablet  Hypothyroidism, unspecified type - Plan: levothyroxine (SYNTHROID, LEVOTHROID) 112 MCG tablet, levothyroxine (SYNTHROID, LEVOTHROID) 25 MCG tablet  Acute hearing loss, left ear- cerumen impaction  No problem-specific Assessment & Plan notes found for this encounter.   The patient was counseled, risk factors were discussed, anticipatory guidance given.   New Prescriptions   LEVOTHYROXINE (SYNTHROID, LEVOTHROID) 112 MCG TABLET    Take 1 tablet (112 mcg total) by mouth daily.   LEVOTHYROXINE (SYNTHROID, LEVOTHROID) 25 MCG TABLET    Take one half tablet along with your 100 mcg tablet for a total of 112 mcg daily.    Discontinued Medications   LISINOPRIL-HYDROCHLOROTHIAZIDE (PRINZIDE,ZESTORETIC) 20-25 MG TABLET    TAKE 1 TABLET DAILY    Modified Medications   Modified Medication Previous Medication   ATORVASTATIN (LIPITOR) 20 MG TABLET atorvastatin (LIPITOR) 20 MG tablet      Take 0.5 tablets (10 mg total) by mouth at bedtime.    Take 1 tablet (20 mg total) by mouth at bedtime.     Meds ordered this encounter  Medications  . atorvastatin (LIPITOR) 20 MG tablet    Sig: Take 0.5 tablets (10 mg total) by mouth at bedtime.    Dispense:  90 tablet    Refill:  3  . levothyroxine (SYNTHROID, LEVOTHROID) 112 MCG tablet    Sig: Take 1 tablet (112 mcg total) by mouth daily.    Dispense:  90 tablet    Refill:  1  . levothyroxine (SYNTHROID, LEVOTHROID) 25 MCG tablet    Sig: Take one half tablet along with your 100 mcg tablet for a total  of 112 mcg daily.    Dispense:  45 tablet    Refill:  0     Orders Placed This Encounter  Procedures  . POCT UA - Microalbumin     Gross side effects, risk and benefits, and alternatives of medications and treatment plan in general discussed with patient.  Patient is aware that all medications have potential side effects and we are unable to predict every side effect or drug-drug interaction that may occur.   Patient will call with any questions prior to using medication if they have concerns.  Expresses verbal understanding and consents to current therapy and treatment regimen.  No barriers to understanding were identified.  Red flag symptoms and signs discussed in detail.  Patient expressed understanding regarding what to do in case of emergency\urgent symptoms  Please see AVS handed out to patient at the end of our visit for further patient instructions/ counseling done pertaining to today's office visit.   Return for 6-8 wks- OV with me ( wt check and will reck TSH, T4).     Note: This document was prepared using Dragon voice recognition software and may include unintentional dictation errors.  Rachel Simmons 11:50 AM --------------------------------------------------------------------------------------------------------------------------------------------------------------------------------------------------------------------------------------------    Subjective:    CC: No chief complaint on file.   HPI: Rachel Simmons is a 51 y.o. female who presents to [facility] today for issues as discussed below.  Last OV we changed - dec metfromin to 500mg  and inc saxenda  Inc fruits and veggies a lot--> using lose it app which helps A LOT.    21 years ago when she gave birth to her daughter she was 361 pounds.  Today she is 259 pounds.  Problem  Hyperlipidemia Associated With Type 2 Diabetes Mellitus (Hcc)  Hypothyroidism     Wt Readings from  Last 3 Encounters:  08/29/17 259 lb 4.8 oz (117.6 kg)  07/13/17 266 lb 4.8 oz (120.8 kg)  05/25/17 281 lb (127.5 kg)   BP Readings from Last 3 Encounters:  08/29/17 128/86  07/13/17 129/88  05/25/17 124/87   Pulse Readings from Last 3 Encounters:  08/29/17 73  07/13/17 76  05/25/17 71   BMI Readings from Last 3 Encounters:  08/29/17 39.14 kg/m  07/13/17 40.19 kg/m  05/25/17 42.41 kg/m     Patient Care Team    Relationship Specialty Notifications Start End  [name], DO PCP - General Family Medicine  08/18/16   Marice Potter, MD Consulting Physician Oncology  08/29/17      Patient Active Problem List   Diagnosis Date Noted  . Obesity, Class III, BMI 40-49.9 (morbid obesity) ([place]) 08/20/2016    Priority: High  . Type II Diabetes mellitus without complication ([place]) 09/10/1218    Priority: High  . Hypertension 08/18/2016    Priority: High  . Thyroid disease 08/18/2016    Priority: High  . Diabetic peripheral neuropathy ([place]) 04/21/2015    Priority: High  . Hypertriglyceridemia 10/18/2016    Priority: Medium  . Insomnia 10/18/2016    Priority: Medium  . 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 Adjustment disorder with mixed anxiety and depressed mood 08/20/2016    Priority: Medium  . Chronic lymphoblastic leukemia ([place]) 08/18/2016    Priority: Low  . Family history of breast cancer     Priority: Low  . Family history of colon cancer- brother age 64     Priority: Low  . Hyperlipidemia associated with type 2 diabetes mellitus ([place]) 08/29/2017  . Hypothyroidism 08/29/2017  . Family history of colon cancer requiring screening colonoscopy 08/20/2016  . Chronic back pain greater than 3 months duration 08/18/2016  . Genetic testing 05/21/2015  . Family history of polyps in the colon     Past Medical history, Surgical history, Family history, Social history, Allergies and Medications have been entered into the medical record,  reviewed and changed as needed.    Current Meds  Medication Sig  . aspirin 81 MG chewable tablet Chew 81 mg by mouth daily.  Marland Kitchen atorvastatin (LIPITOR) 20 MG tablet Take 0.5 tablets (10 mg total) by mouth at bedtime.  . calcium carbonate (CALCIUM 600) 600 MG TABS tablet Take 1 tablet by mouth daily.  . cetirizine (ZYRTEC) 5 MG tablet Take 1 tablet by mouth daily.  . Cholecalciferol (VITAMIN D3) 5000 units TABS 5,000 IU OTC vitamin D3 daily.  Marland Kitchen escitalopram (LEXAPRO) 20 MG tablet Take 1 tablet (20 mg total) by mouth daily.  Marland Kitchen gabapentin (NEURONTIN) 300 MG capsule Take 1 capsule (300 mg total) by mouth 3 (three) times daily.  Marland Kitchen glucose blood test strip (Freestyle lite) check FBS and 2 hr PP  . IRON PO Take 65 mg by mouth daily.  Marland Kitchen levothyroxine (SYNTHROID, LEVOTHROID) 100 MCG tablet TAKE 1 TABLET DAILY  . liraglutide 18 MG/3ML SOPN Inject 0.1 mLs (0.6 mg total) into the skin daily. Inc  0.6 weekly as tol  to a max of 3 mg/d (Patient taking differently: Inject 0.6 mg into the skin daily. Inc 0.6 weekly as tol  to a max of 3 mg/d)  . lisinopril-hydrochlorothiazide (PRINZIDE,ZESTORETIC) 20-25 MG tablet Take 1 tablet by mouth daily.  . metFORMIN (GLUCOPHAGE-XR) 500 MG 24 hr tablet Take 1 tablet (500 mg total) by mouth at bedtime.  . Omega-3 Fatty Acids (FISH OIL) 1200 MG CAPS Take 4-5 tabs daily  . Vitamin D, Ergocalciferol, (DRISDOL) 50000 units CAPS capsule Take 1 capsule (50,000 Units total) by mouth every 7 (seven) days.  . [DISCONTINUED] atorvastatin (LIPITOR) 20 MG tablet Take 1 tablet (20 mg total) by mouth at bedtime.    Allergies:  No Known Allergies   Review of Systems: General:   Denies fever, chills, unexplained weight loss.  Optho/Auditory:   Denies visual changes, blurred vision/LOV Respiratory:   Denies wheeze, DOE more than baseline levels.  Cardiovascular:   Denies chest pain, palpitations, new onset peripheral edema  Gastrointestinal:   Denies nausea, vomiting, diarrhea, abd  pain.  Genitourinary: Denies dysuria, freq/ urgency, flank pain or discharge from genitals.  Endocrine:     Denies hot or cold intolerance, polyuria, polydipsia. Musculoskeletal:   Denies unexplained myalgias, joint swelling, unexplained arthralgias, gait problems.  Skin:  Denies new onset rash, suspicious lesions Neurological:     Denies dizziness, unexplained weakness, numbness  Psychiatric/Behavioral:   Denies mood changes, suicidal or homicidal ideations, hallucinations    Objective:   Blood pressure 128/86, pulse 73, height 5' 8.25" (1.734 m), weight 259 lb 4.8 oz (117.6 kg). Body mass index is 39.14 kg/m. General:  Well Developed, well nourished, appropriate for stated age.  Neuro:  Alert and oriented,  extra-ocular muscles intact  HEENT:  Normocephalic, atraumatic, neck supple, no carotid bruits appreciated  Skin:  no gross rash, warm, pink. Cardiac:  RRR, S1 S2 Respiratory:  ECTA B/L and A/P, Not using accessory muscles, speaking in full sentences- unlabored. Vascular:  Ext warm, no cyanosis apprec.; cap RF less 2 sec. Psych:  No HI/SI, judgement and insight good, Euthymic mood. Full Affect.

## 2017-08-29 NOTE — Patient Instructions (Signed)
We are increasing your levothyroxine from 100 g daily to 112 g daily.  Whenever we make a dose change we need to recheck your labs in 6-8 weeks so please make a lab only visit for TSH, free T4.  Also, a new prescription for the 112 was given but so that you can use up your 100 g tablets U Artie bought, I wrote for you to take one half tablet of a 25 g tablet along with your 100 g until you use them all.

## 2017-09-19 ENCOUNTER — Other Ambulatory Visit: Payer: Self-pay | Admitting: Family Medicine

## 2017-09-19 DIAGNOSIS — Z1231 Encounter for screening mammogram for malignant neoplasm of breast: Secondary | ICD-10-CM

## 2017-09-25 LAB — HM DIABETES EYE EXAM

## 2017-09-28 ENCOUNTER — Other Ambulatory Visit: Payer: Self-pay | Admitting: Family Medicine

## 2017-10-10 ENCOUNTER — Ambulatory Visit (INDEPENDENT_AMBULATORY_CARE_PROVIDER_SITE_OTHER): Admitting: Family Medicine

## 2017-10-10 ENCOUNTER — Encounter: Payer: Self-pay | Admitting: Family Medicine

## 2017-10-10 ENCOUNTER — Ambulatory Visit
Admission: RE | Admit: 2017-10-10 | Discharge: 2017-10-10 | Disposition: A | Discharge status: Home / Self Care | Source: Ambulatory Visit | Attending: Family Medicine | Admitting: Family Medicine

## 2017-10-10 VITALS — BP 120/76 | HR 63 | Ht 68.25 in | Wt 253.3 lb

## 2017-10-10 DIAGNOSIS — M7712 Lateral epicondylitis, left elbow: Secondary | ICD-10-CM | POA: Diagnosis not present

## 2017-10-10 DIAGNOSIS — E1159 Type 2 diabetes mellitus with other circulatory complications: Secondary | ICD-10-CM | POA: Diagnosis not present

## 2017-10-10 DIAGNOSIS — F4323 Adjustment disorder with mixed anxiety and depressed mood: Secondary | ICD-10-CM

## 2017-10-10 DIAGNOSIS — E1142 Type 2 diabetes mellitus with diabetic polyneuropathy: Secondary | ICD-10-CM

## 2017-10-10 DIAGNOSIS — I10 Essential (primary) hypertension: Secondary | ICD-10-CM

## 2017-10-10 DIAGNOSIS — M629 Disorder of muscle, unspecified: Secondary | ICD-10-CM | POA: Diagnosis not present

## 2017-10-10 DIAGNOSIS — E079 Disorder of thyroid, unspecified: Secondary | ICD-10-CM

## 2017-10-10 DIAGNOSIS — Z23 Encounter for immunization: Secondary | ICD-10-CM | POA: Diagnosis not present

## 2017-10-10 DIAGNOSIS — C9110 Chronic lymphocytic leukemia of B-cell type not having achieved remission: Secondary | ICD-10-CM | POA: Diagnosis not present

## 2017-10-10 DIAGNOSIS — E119 Type 2 diabetes mellitus without complications: Secondary | ICD-10-CM

## 2017-10-10 DIAGNOSIS — Z1231 Encounter for screening mammogram for malignant neoplasm of breast: Secondary | ICD-10-CM

## 2017-10-10 DIAGNOSIS — Z1211 Encounter for screening for malignant neoplasm of colon: Secondary | ICD-10-CM

## 2017-10-10 DIAGNOSIS — Z862 Personal history of diseases of the blood and blood-forming organs and certain disorders involving the immune mechanism: Secondary | ICD-10-CM | POA: Diagnosis not present

## 2017-10-10 MED ORDER — CYCLOBENZAPRINE HCL 10 MG PO TABS: 10.0000 mg | ORAL_TABLET | Freq: Three times a day (TID) | ORAL | 1 refills | Status: DC | PRN

## 2017-10-10 MED ORDER — LISINOPRIL-HYDROCHLOROTHIAZIDE 20-25 MG PO TABS: 1.0000 | ORAL_TABLET | Freq: Every day | ORAL | 3 refills | Status: DC

## 2017-10-10 NOTE — Progress Notes (Signed)
Opened in error. T. Adrianah Prophete, CMA 

## 2017-10-10 NOTE — Progress Notes (Signed)
Impression and Recommendations:    1. Muscle dysfunction/ spasm- upper traps on L   2. Left tennis elbow   3. Thyroid disease   4. Hypertension associated with diabetes ([place])   5. Type II Diabetes mellitus without complication ([place])   6. Diabetic peripheral neuropathy (HCC)   7. Obesity, Class III, BMI 40-49.9 (morbid obesity) ([place])   8. Adjustment disorder with mixed anxiety and depressed mood   9. Need for influenza vaccination   10. Screening for colon cancer     Chronically elevated white blood cell count:  When you follow with your hematology-oncology doctor today-ask about acceptable ranges of your white blood cell count are considered "okay " for you,  Please ask him at what white blood cell number should our level of concern be heightened that you need to follow-up with him.  Lateral epicondylitis: - Please obtain tennis elbow brace and applied as tightly as possible approximately 1 inch down from your bony protuberance on the lateral aspect of your elbow  -Ice your elbow for 15-20 minutes 3-4 times a day and especially after your work schedule.  Or any repetitive movements.   Upper trapezius and cervical neck strain with trigger points:  -  ice Massage on your upper traps followed by heat which will increase blood flow and bring good oxygenated blood to the area.  If you cannot do the ice cup massage on it then apply heat to area for 15-20 minutes several times a day as needed for pain as well. -Use muscle relaxers as needed as well -risk benefits of medicines discussed with patient.  1. Need for influenza vaccination - Flu Vaccine QUAD 36+ mos IM  2. Screening for colon cancer - Ambulatory referral to Gastroenterology  3. Hypertension associated with diabetes ([place]) - lisinopril-hydrochlorothiazide (PRINZIDE,ZESTORETIC) 20-25 MG tablet; Take 1 tablet daily by mouth.   -I am not sure why but per patient it states that we discontinued her lisinopril  hydrochlorothiazide.  On a note dated end of October it appears that Rachel [facility], CMA did discontinue the medicine for an unknown reason.  I refilled this med today and sent out new prescription for patient.   4. Type II Diabetes mellitus without complication (HCC) -S5K recently done 1-2 months ago and was under great control.  Continue to lose weight and live healthier lifestyle.  5. Diabetic peripheral neuropathy (HCC) -No complaints, well controlled  6. Obesity, Class III, BMI 40-49.9 (morbid obesity) ([place]) -Great job with weight loss, slow and steady, advised to start exercising\walking regularly  7. Adjustment disorder with mixed anxiety and depressed mood -Mood has been okay and pretty well controlled.  Patient under little more stress that she is worried about the health and well-being of her daughter who I have recently seen for mood disorder.  We think this is also the cause of her muscle spasms as she holds a lot of tension in her upper back  8. Thyroid disease -Recheck labs today.  Continue current dose.  Other orders - Multiple Vitamins-Minerals (ONE-A-DAY WOMENS 50 PLUS PO); Take 1 tablet daily by mouth.  The patient was counseled, risk factors were discussed, anticipatory guidance given.   Meds ordered this encounter  Medications  . DISCONTD: lisinopril-hydrochlorothiazide (PRINZIDE,ZESTORETIC) 20-25 MG tablet    Sig: Take 1 tablet daily by mouth.    Dispense:  90 tablet    Refill:  3  . DISCONTD: cyclobenzaprine (FLEXERIL) 10 MG tablet    Sig: Take 1 tablet (10  mg total) 3 (three) times daily as needed by mouth for muscle spasms.    Dispense:  30 tablet    Refill:  1    Orders Placed This Encounter  Procedures  . Flu Vaccine QUAD 36+ mos IM  . TSH+T4F+T3Free  . Ambulatory referral to Gastroenterology    Gross side effects, risk and benefits, and alternatives of medications and treatment plan in general discussed with patient.  Patient is aware that all medications  have potential side effects and we are unable to predict every side effect or drug-drug interaction that may occur.   Patient will call with any questions prior to using medication if they have concerns.  Expresses verbal understanding and consents to current therapy and treatment regimen.  No barriers to understanding were identified.  Red flag symptoms and signs discussed in detail.  Patient expressed understanding regarding what to do in case of emergency\urgent symptoms  Please see AVS handed out to patient at the end of our visit for further patient instructions/ counseling done pertaining to today's office visit.   Return if symptoms worsen or fail to improve, for mid-Jan chronic care- A1c etc.     Note: This document was prepared using Dragon voice recognition software and may include unintentional dictation errors.  Rachel Simmons 4:50 PM --------------------------------------------------------------------------------------------------------------------------------------------------------------------------------------------------------------------------------------------    Subjective:    CC:  Chief Complaint  Patient presents with  . Weight Loss  . Arm Pain    left side, persistent pain    HPI: Rachel Simmons is a 51 y.o. female who presents to [place] [facility] today for issues as discussed below.  Thyroid- inc dose last time- abotu 6 wks ago.  Feels less lethargy, more pep in step.  No s-e.  Tolerating well.  Needs recheck of labs  Obese:  Lost another 6 pounds since September.  Doing fantastic.   L arm pain- started hurting while ago up in upper back/ shoulder region.  Now traveling down to elbow- lateral aspect.  No tingling, + numb and feels gettign weaker with her grip.    No injury, but awoke one morning with a little sense of crick in her neck.   Then it seemed to just gradually get worse.   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  She has tried cold, heat, Biofreeze, "horse pills" etc.  Nothing has helped.     Wt Readings from Last 3 Encounters:  01/30/19 268 lb 12.8 oz (121.9 kg)  12/18/18 264 lb 6.4 oz (119.9 kg)  10/03/18 265 lb (120.2 kg)   BP Readings from Last 3 Encounters:  01/30/19 118/76  12/18/18 126/72  10/03/18 114/73   Pulse Readings from Last 3 Encounters:  01/30/19 69  12/18/18 100  10/03/18 89   BMI Readings from Last 3 Encounters:  01/30/19 41.17 kg/m  12/18/18 40.50 kg/m  10/03/18 40.59 kg/m     Patient Care Team    Relationship Specialty Notifications Start End  Rachel Dance, DO PCP - General Family Medicine  08/18/16   Marice Potter, MD Consulting Physician Oncology  08/29/17   Marin Comment, My [place], [place] Referring Physician Optometry  10/10/17      Patient Active Problem List   Diagnosis Date Noted  . Hyperlipidemia associated with type 2 diabetes mellitus ([place]) 08/29/2017    Priority: High  . Obesity, Class III, BMI 40-49.9 (morbid  obesity) ([place]) 08/20/2016    Priority: High  . Type II Diabetes mellitus without complication ([place]) 40/07/6760    Priority: High  . Hypertension associated with diabetes ([place]) 08/18/2016    Priority: High  . Thyroid disease 08/18/2016    Priority: High  . Diabetic peripheral neuropathy ([place]) 04/21/2015    Priority: High  . Hypertriglyceridemia 10/18/2016    Priority: Medium  . Insomnia 10/18/2016    Priority: Medium  . 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 Adjustment disorder with mixed anxiety and depressed mood 08/20/2016    Priority: Medium  . Chronic lymphoblastic leukemia 08/18/2016    Priority: Low  . Family history of breast cancer     Priority: Low  . Family history of colon cancer- brother age 73     Priority: Low  . depressed mood, recurrent 01/30/2019  . Flu-like symptoms 12/18/2018  .  Myalgias and generalized arthralgias 06/20/2018  . High risk for vaginitis due to Candida secondary to antibiotic use 06/06/2018  . Left tennis elbow 10/10/2017  . Muscle dysfunction/ spasm- upper traps on L 10/10/2017  . Hypothyroidism 08/29/2017  . Family history of colon cancer requiring screening colonoscopy 08/20/2016  . Chronic back pain greater than 3 months duration 08/18/2016  . Genetic testing 05/21/2015  . Family history of polyps in the colon     Past Medical history, Surgical history, Family history, Social history, Allergies and Medications have been entered into the medical record, reviewed and changed as needed.    Current Meds  Medication Sig  . aspirin 81 MG chewable tablet Chew 81 mg by mouth daily.  . calcium carbonate (CALCIUM 600) 600 MG TABS tablet Take 1 tablet by mouth daily.  . cetirizine (ZYRTEC) 5 MG tablet Take 1 tablet by mouth daily.  . Cholecalciferol (VITAMIN D3) 5000 units TABS 5,000 IU OTC vitamin D3 daily.  . Multiple Vitamins-Minerals (ONE-A-DAY WOMENS 50 PLUS PO) Take 1 tablet daily by mouth.  . Omega-3 Fatty Acids (FISH OIL) 1200 MG CAPS Take 4-5 tabs daily  . [DISCONTINUED] atorvastatin (LIPITOR) 20 MG tablet Take 0.5 tablets (10 mg total) by mouth at bedtime.  . [DISCONTINUED] escitalopram (LEXAPRO) 20 MG tablet Take 1 tablet (20 mg total) by mouth daily.  . [DISCONTINUED] gabapentin (NEURONTIN) 300 MG capsule Take 1 capsule (300 mg total) by mouth 3 (three) times daily.  . [DISCONTINUED] glucose blood test strip (Freestyle lite) check FBS and 2 hr PP  . [DISCONTINUED] levothyroxine (SYNTHROID, LEVOTHROID) 112 MCG tablet Take 1 tablet (112 mcg total) by mouth daily.  . [DISCONTINUED] liraglutide 18 MG/3ML SOPN Inject 0.1 mLs (0.6 mg total) into the skin daily. Inc 0.6 weekly as tol  to a max of 3 mg/d  . [DISCONTINUED] lisinopril-hydrochlorothiazide (PRINZIDE,ZESTORETIC) 20-25 MG tablet Take 1 tablet by mouth daily.  . [DISCONTINUED]  lisinopril-hydrochlorothiazide (PRINZIDE,ZESTORETIC) 20-25 MG tablet Take 1 tablet daily by mouth.  . [DISCONTINUED] metFORMIN (GLUCOPHAGE-XR) 500 MG 24 hr tablet Take 1 tablet (500 mg total) by mouth at bedtime.  . [DISCONTINUED] Vitamin D, Ergocalciferol, (DRISDOL) 50000 units CAPS capsule Take 1 capsule (50,000 Units total) by mouth every 7 (seven) days.    Allergies:  No Known Allergies   Review of Systems: General:   Denies fever, chills, unexplained weight loss.  Optho/Auditory:   Denies visual changes, blurred vision/LOV Respiratory:   Denies wheeze, DOE more than baseline levels.  Cardiovascular:   Denies chest pain, palpitations, new onset peripheral edema  Gastrointestinal:  Denies nausea, vomiting, diarrhea, abd pain.  Genitourinary: Denies dysuria, freq/ urgency, flank pain or discharge from genitals.  Endocrine:     Denies hot or cold intolerance, polyuria, polydipsia. Musculoskeletal:   Denies unexplained myalgias, joint swelling, unexplained arthralgias, gait problems.  Skin:  Denies new onset rash, suspicious lesions Neurological:     Denies dizziness, unexplained weakness, numbness  Psychiatric/Behavioral:   Denies mood changes, suicidal or homicidal ideations, hallucinations    Objective:   Blood pressure 120/76, pulse 63, height 5' 8.25" (1.734 m), weight 253 lb 4.8 oz (114.9 kg). Body mass index is 38.23 kg/m. General:  Well Developed, well nourished, appropriate for stated age.  Neuro:  Alert and oriented,  extra-ocular muscles intact  HEENT:  Normocephalic, atraumatic, neck supple, no carotid bruits appreciated  Skin:  no gross rash, warm, pink. Cardiac:  RRR, S1 S2 Respiratory:  ECTA B/L and A/P, Not using accessory muscles, speaking in full sentences- unlabored. M-sk:  Patient is extremely tight in her left upper traps and splenius capitis muscle on the left.  Has multiple trigger points.  Also tenderness along the lateral epicondyle and approximately 1  inch distal to it.  Pain with resisted extension of the wrist. Mild decrease in grip strength on left versus right.  Rest of the exam was otherwise unremarkable and within normal limits Vascular:  Ext warm, no cyanosis apprec.; cap RF less 2 sec. Psych:  No HI/SI, judgement and insight good, Euthymic mood. Full Affect.

## 2017-10-10 NOTE — Patient Instructions (Addendum)
When you follow with your hematology-oncology doctor today ranges of your white blood cell count are considered "okay "for you,  Please ask him what and then also at what point it is our level of concern need to be heightened and for you to follow-up with him .  Please obtain tennis elbow brace and applied as tightly as possible approximately 1 inch down from your bony protuberance on the lateral aspect of your elbow  -Ice your elbow for 15-20 minutes 3-4 times a day and especially after your work schedule.  Or any repetitive movements.  Also doing ice Massage on your upper traps followed by heat which will increase blood flow and bring good oxygenated blood to the area.  If you cannot do the ice cup massage on that then apply heat to area for 15-20 minutes several times a day as needed for pain as well. -Use muscle relaxers as needed as well per    Tennis Elbow Tennis elbow (lateral epicondylitis) is inflammation of the outer tendons of your forearm close to your elbow. Your tendons attach your muscles to your bones. The outer tendons of your forearm are used to extend your wrist, and they attach on the outside part of your elbow. Tennis elbow is often found in people who play tennis, but anyone may get the condition from repeatedly extending the wrist or turning the forearm. What are the causes? This condition is caused by repeatedly extending your wrist and using your hands. It can result from sports or work that requires repetitive forearm movements. Tennis elbow may also be caused by an injury. What increases the risk? You have a higher risk of developing tennis elbow if you play tennis or another racquet sport. You also have a higher risk if you frequently use your hands for work. This condition is also more likely to develop in:  Musicians.  Carpenters, painters, and plumbers.  Cooks.  Cashiers.  People who work in [employer].  Architect workers.  Butchers.  People who use  computers.  What are the signs or symptoms? Symptoms of this condition include:  Pain and tenderness in your forearm and the outer part of your elbow. You may only feel the pain when you use your arm, or you may feel it even when you are not using your arm.  A burning feeling that runs from your elbow through your arm.  Weak grip in your hands.  How is this diagnosed? This condition may be diagnosed by medical history and physical exam. You may also have other tests, including:  X-rays.  MRI.  How is this treated? Your health care provider will recommend lifestyle adjustments, such as resting and icing your arm. Treatment may also include:  Medicines for inflammation. This may include shots of cortisone if your pain continues.  Physical therapy. This may include massage or exercises.  An elbow brace.  Surgery may eventually be recommended if your pain does not go away with treatment. Follow these instructions at home: Activity  Rest your elbow and wrist as directed by your health care provider. Try to avoid any activities that caused the problem until your health care provider says that you can do them again.  If a physical therapist teaches you exercises, do all of them as directed.  If you lift an object, lift it with your palm facing upward. This lowers the stress on your elbow. Lifestyle  If your tennis elbow is caused by sports, check your equipment and make sure that: ?  You are using it correctly. ? It is the best fit for you.  If your tennis elbow is caused by work, take breaks frequently, if you are able. Talk with your manager about how to best perform tasks in a way that is safe. ? If your tennis elbow is caused by computer use, talk with your manager about any changes that can be made to your work environment. General instructions  If directed, apply ice to the painful area: ? Put ice in a plastic bag. ? Place a towel between your skin and the bag. ? Leave  the ice on for 20 minutes, 2-3 times per day.  Take medicines only as directed by your health care provider.  If you were given a brace, wear it as directed by your health care provider.  Keep all follow-up visits as directed by your health care provider. This is important. Contact a health care provider if:  Your pain does not get better with treatment.  Your pain gets worse.  You have numbness or weakness in your forearm, hand, or fingers. This information is not intended to replace advice given to you by your health care provider. Make sure you discuss any questions you have with your health care provider. Document Released: 11/21/2005 Document Revised: 07/21/2016 Document Reviewed: 11/17/2014 Elsevier Interactive Patient Education  [employer].

## 2017-10-11 LAB — TSH+T4F+T3FREE
Free T4: 1.44 ng/dL (ref 0.82–1.77)
T3, Free: 2.7 pg/mL (ref 2.0–4.4)
TSH: 1.81 u[IU]/mL (ref 0.450–4.500)

## 2017-10-18 ENCOUNTER — Telehealth: Payer: Self-pay | Admitting: Family Medicine

## 2017-10-18 NOTE — Telephone Encounter (Signed)
Received  call  from [facility] @ [employer] requesting to speak w/ whoever does precert for Prescriptions--   Rx [facility] Caller has error in the request (Escript) and requires some help from [employer] rep)---   pls call her at [phone]-  --Ref# [id number].  --[facility]

## 2017-10-20 ENCOUNTER — Telehealth: Payer: Self-pay

## 2017-10-20 NOTE — Telephone Encounter (Signed)
PA done for continued use of saxenda injections - approved ref # [id number] 09/19/17 - 10/19/18. MPulliam, CMA/RT(R)

## 2017-11-01 ENCOUNTER — Encounter: Payer: Self-pay | Admitting: Family Medicine

## 2017-11-01 ENCOUNTER — Ambulatory Visit (INDEPENDENT_AMBULATORY_CARE_PROVIDER_SITE_OTHER): Admitting: Family Medicine

## 2017-11-01 VITALS — BP 119/85 | HR 78 | Ht 69.5 in | Wt 254.0 lb

## 2017-11-01 DIAGNOSIS — M629 Disorder of muscle, unspecified: Secondary | ICD-10-CM | POA: Diagnosis not present

## 2017-11-01 DIAGNOSIS — M7712 Lateral epicondylitis, left elbow: Secondary | ICD-10-CM | POA: Diagnosis not present

## 2017-11-01 MED ORDER — PREDNISONE 20 MG PO TABS: ORAL_TABLET | ORAL | 0 refills | Status: DC

## 2017-11-01 NOTE — Patient Instructions (Signed)
Next step would be for trigger point injections into the muscles as well as injection for your epicondylitis.  Let me know if you need these in the future.  Please really try to avoid aggravating activities and repetitive motions as this is the key to your healing

## 2017-11-01 NOTE — Progress Notes (Signed)
Pt here for an acute care OV today   Impression and Recommendations:    1. Muscle dysfunction/ spasm- upper traps on L   2. Left tennis elbow     Education and routine counseling performed. Handouts provided  Gross side effects, risk and benefits, and alternatives of medications and treatment plan in general discussed with patient.  Patient is aware that all medications have potential side effects and we are unable to predict every side effect or drug-drug interaction that may occur.   Patient will call with any questions prior to using medication if they have concerns.  Expresses verbal understanding and consents to current therapy and treatment regimen.  No barriers to understanding were identified.  Red flag symptoms and signs discussed in detail.  Patient expressed understanding regarding what to do in case of emergency\urgent symptoms  Please see AVS handed out to patient at the end of our visit for further patient instructions/ counseling done pertaining to today's office visit.   Return if symptoms worsen or fail to improve.     Note: This document was prepared using Dragon voice recognition software and may include unintentional dictation errors.  [name] 3:31 PM --------------------------------------------------------------------------------------------------------------------------------------------------------------------------------------------------------------------------------------------    Subjective:    CC:  Chief Complaint  Patient presents with  . Arm Pain    left arm pain    HPI: Rachel Simmons is a 51 y.o. female who presents to [place] at [facility] today for issues as discussed below.  Last seen October 10, 2017.  We briefly discussed left tennis elbow but mostly focused on left upper traps having several trigger points in the trapezius and cervical neck muscles.  She was given Flexeril and told to use ice cup  massage and/or NSAIDs.  She is wondering about the elbow pain. Worse with certain mvmnts- but can't identify what. Tried for pain- nothing    Wt Readings from Last 3 Encounters:  01/30/19 268 lb 12.8 oz (121.9 kg)  12/18/18 264 lb 6.4 oz (119.9 kg)  10/03/18 265 lb (120.2 kg)   BP Readings from Last 3 Encounters:  01/30/19 118/76  12/18/18 126/72  10/03/18 114/73   BMI Readings from Last 3 Encounters:  01/30/19 41.17 kg/m  12/18/18 40.50 kg/m  10/03/18 40.59 kg/m     Patient Care Team    Relationship Specialty Notifications Start End  [name], DO PCP - General Family Medicine  08/18/16   Marice Potter, MD Consulting Physician Oncology  08/29/17   Marin Comment, My [place], [place] Referring Physician Optometry  10/10/17      Patient Active Problem List   Diagnosis Date Noted  . Hyperlipidemia associated with type 2 diabetes mellitus ([place]) 08/29/2017    Priority: High  . Obesity, Class III, BMI 40-49.9 (morbid obesity) ([place]) 08/20/2016    Priority: High  . Type II Diabetes mellitus without complication ([place]) 28/31/5176    Priority: High  . Hypertension associated with diabetes ([place]) 08/18/2016    Priority: High  . Thyroid disease 08/18/2016    Priority: High  . Diabetic peripheral neuropathy ([place]) 04/21/2015    Priority: High  . Hypertriglyceridemia 10/18/2016    Priority: Medium  . Insomnia 10/18/2016    Priority: Medium  . 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 Adjustment disorder with mixed anxiety and depressed mood 08/20/2016    Priority: Medium  . Chronic lymphoblastic leukemia 08/18/2016    Priority: Low  . Family  history of breast cancer     Priority: Low  . Family history of colon cancer- brother age 27     Priority: Low  . depressed mood, recurrent 01/30/2019  . Flu-like symptoms 12/18/2018  . Myalgias and generalized arthralgias 06/20/2018  . High risk for vaginitis due to Candida secondary to antibiotic use 06/06/2018  .  Left tennis elbow 10/10/2017  . Muscle dysfunction/ spasm- upper traps on L 10/10/2017  . Hypothyroidism 08/29/2017  . Family history of colon cancer requiring screening colonoscopy 08/20/2016  . Chronic back pain greater than 3 months duration 08/18/2016  . Genetic testing 05/21/2015  . Family history of polyps in the colon     Past Medical history, Surgical history, Family history, Social history, Allergies and Medications have been entered into the medical record, reviewed and changed as needed.    Current Meds  Medication Sig  . aspirin 81 MG chewable tablet Chew 81 mg by mouth daily.  . calcium carbonate (CALCIUM 600) 600 MG TABS tablet Take 1 tablet by mouth daily.  . cetirizine (ZYRTEC) 5 MG tablet Take 1 tablet by mouth daily.  . Cholecalciferol (VITAMIN D3) 5000 units TABS 5,000 IU OTC vitamin D3 daily.  . Multiple Vitamins-Minerals (ONE-A-DAY WOMENS 50 PLUS PO) Take 1 tablet daily by mouth.  . Omega-3 Fatty Acids (FISH OIL) 1200 MG CAPS Take 4-5 tabs daily  . [DISCONTINUED] atorvastatin (LIPITOR) 20 MG tablet Take 0.5 tablets (10 mg total) by mouth at bedtime.  . [DISCONTINUED] cyclobenzaprine (FLEXERIL) 10 MG tablet Take 1 tablet (10 mg total) 3 (three) times daily as needed by mouth for muscle spasms.  . [DISCONTINUED] escitalopram (LEXAPRO) 20 MG tablet Take 1 tablet (20 mg total) by mouth daily.  . [DISCONTINUED] gabapentin (NEURONTIN) 300 MG capsule Take 1 capsule (300 mg total) by mouth 3 (three) times daily.  . [DISCONTINUED] glucose blood test strip (Freestyle lite) check FBS and 2 hr PP  . [DISCONTINUED] levothyroxine (SYNTHROID, LEVOTHROID) 112 MCG tablet Take 1 tablet (112 mcg total) by mouth daily.  . [DISCONTINUED] liraglutide 18 MG/3ML SOPN Inject 0.1 mLs (0.6 mg total) into the skin daily. Inc 0.6 weekly as tol  to a max of 3 mg/d  . [DISCONTINUED] lisinopril-hydrochlorothiazide (PRINZIDE,ZESTORETIC) 20-25 MG tablet Take 1 tablet daily by mouth.  . [DISCONTINUED]  metFORMIN (GLUCOPHAGE-XR) 500 MG 24 hr tablet Take 1 tablet (500 mg total) by mouth at bedtime.  . [DISCONTINUED] Vitamin D, Ergocalciferol, (DRISDOL) 50000 units CAPS capsule Take 1 capsule (50,000 Units total) by mouth every 7 (seven) days.    Allergies:  No Known Allergies   Review of Systems: General:   Denies fever, chills, unexplained weight loss.  Optho/Auditory:   Denies visual changes, blurred vision/LOV Respiratory:   Denies wheeze, DOE more than baseline levels.  Cardiovascular:   Denies chest pain, palpitations, new onset peripheral edema  Gastrointestinal:   Denies nausea, vomiting, diarrhea, abd pain.  Genitourinary: Denies dysuria, freq/ urgency, flank pain or discharge from genitals.  Endocrine:     Denies hot or cold intolerance, polyuria, polydipsia. Musculoskeletal:   Denies unexplained myalgias, joint swelling, unexplained arthralgias, gait problems.  Skin:  Denies new onset rash, suspicious lesions Neurological:     Denies dizziness, unexplained weakness, numbness  Psychiatric/Behavioral:   Denies mood changes, suicidal or homicidal ideations, hallucinations    Objective:   Blood pressure 119/85, pulse 78, height 5' 9.5" (1.765 m), weight 254 lb (115.2 kg). Body mass index is 36.97 kg/m. General:  Well Developed, well nourished,  appropriate for stated age.  Neuro:  Alert and oriented,  extra-ocular muscles intact  HEENT:  Normocephalic, atraumatic, neck supple Skin:  no gross rash, warm, pink. Cardiac:  RRR, S1 S2 Respiratory:  ECTA B/L and A/P, Not using accessory muscles, speaking in full sentences- unlabored. Vascular:  Ext warm, no cyanosis apprec.; cap RF less 2 sec. Psych:  No HI/SI, judgement and insight good, Euthymic mood. Full Affect.

## 2017-11-08 ENCOUNTER — Other Ambulatory Visit: Payer: Self-pay | Admitting: Family Medicine

## 2017-11-08 DIAGNOSIS — E1142 Type 2 diabetes mellitus with diabetic polyneuropathy: Secondary | ICD-10-CM

## 2017-11-09 ENCOUNTER — Encounter: Payer: Self-pay | Admitting: Family Medicine

## 2017-11-24 ENCOUNTER — Other Ambulatory Visit: Payer: Self-pay | Admitting: Family Medicine

## 2017-12-01 ENCOUNTER — Encounter: Payer: Self-pay | Admitting: Family Medicine

## 2017-12-26 ENCOUNTER — Ambulatory Visit: Admitting: Family Medicine

## 2018-02-10 ENCOUNTER — Other Ambulatory Visit: Payer: Self-pay | Admitting: Family Medicine

## 2018-02-10 DIAGNOSIS — E039 Hypothyroidism, unspecified: Secondary | ICD-10-CM

## 2018-02-22 ENCOUNTER — Ambulatory Visit (INDEPENDENT_AMBULATORY_CARE_PROVIDER_SITE_OTHER): Admitting: Family Medicine

## 2018-02-22 ENCOUNTER — Encounter: Payer: Self-pay | Admitting: Family Medicine

## 2018-02-22 VITALS — BP 118/80 | HR 70 | Ht 69.5 in | Wt 253.7 lb

## 2018-02-22 DIAGNOSIS — E119 Type 2 diabetes mellitus without complications: Secondary | ICD-10-CM | POA: Diagnosis not present

## 2018-02-22 DIAGNOSIS — M629 Disorder of muscle, unspecified: Secondary | ICD-10-CM | POA: Diagnosis not present

## 2018-02-22 LAB — POCT GLYCOSYLATED HEMOGLOBIN (HGB A1C): HEMOGLOBIN A1C: 5.6

## 2018-02-22 MED ORDER — CYCLOBENZAPRINE HCL 10 MG PO TABS: 10.0000 mg | ORAL_TABLET | Freq: Three times a day (TID) | ORAL | 1 refills | Status: DC | PRN

## 2018-02-22 MED ORDER — PREDNISONE 20 MG PO TABS: ORAL_TABLET | ORAL | 0 refills | Status: DC

## 2018-02-22 NOTE — Patient Instructions (Signed)
If you change your mind about physical therapy please let us know as I highly recommend it.  They would set you up with a home exercise program to not only help with the current symptoms you have but help prevent this from occurring in the future and work on any muscular weakness you have etc.  Keep up the good work with controlling her diabetes, blood pressure and weight.  Please follow-up in 1-3 months for weight loss etc.

## 2018-02-22 NOTE — Progress Notes (Signed)
Pt here for an acute care OV today   Impression and Recommendations:    1. Muscle dysfunction/ spasm- upper traps on L   2. Type II Diabetes mellitus without complication ([place])     1. Left-Sided Shoulder Pain  - Discussed the importance of taking corrective action on this pain. - Physical therapy STRONGLY recommended to the patient at this time, but patient declined. - Patient knows to let us know if she changes her mind about physical therapy.  - Emphasized the need for assessment of where exactly her muscle weakness is occurring.  This will allow discovery of what is causing her left shoulder muscles to strain.  Finding the trigger points will allow the problem to be corrected as it is now, and also prevent it in the future.  - Advised the need for a stretching program and targeted muscle training through therapy.  Discussed that we need to strengthen the muscles that are weak so that her other muscles will stop compensating and causing this pain.  - Advised continued use of ibuprofen, aleve, muscle relaxers as needed. - Patient may use flexeril.  Encouraged ice cup massage.  - Patient should continue exercising regularly, but only engaging in cardio for 2 weeks - no weight training.  She should stay away from repeated motions and anything that recruits her irritated muscles & only engage in activities that keep arms low.  - In the future, if her pain is bothering her, she should try to catch it earlier.  - Reviewed oral steroids as an option to calm the inflammation. - In the past 6 months ago, taking steroids helped alleviate this same pain. - Patient tolerated this well and notes that steroids did not aggravate her blood sugars.   Meds ordered this encounter  Medications  . predniSONE (DELTASONE) 20 MG tablet    Sig: Take 3 tabs po * 2 days, then 2 tabs for 2 d, then 1 tab 2 d, then 1/2 tab 2 days.    Dispense:  15 tablet    Refill:  0  . cyclobenzaprine (FLEXERIL)  10 MG tablet    Sig: Take 1 tablet (10 mg total) by mouth 3 (three) times daily as needed for muscle spasms.    Dispense:  30 tablet    Refill:  1    Orders Placed This Encounter  Procedures  . POCT glycosylated hemoglobin (Hb A1C)     Education and routine counseling performed. Handouts provided  Gross side effects, risk and benefits, and alternatives of medications and treatment plan in general discussed with patient.  Patient is aware that all medications have potential side effects and we are unable to predict every side effect or drug-drug interaction that may occur.   Patient will call with any questions prior to using medication if they have concerns.  Expresses verbal understanding and consents to current therapy and treatment regimen.  No barriers to understanding were identified.  Red flag symptoms and signs discussed in detail.  Patient expressed understanding regarding what to do in case of emergency\urgent symptoms   Please see AVS handed out to patient at the end of our visit for further patient instructions/ counseling done pertaining to today's office visit.   Return if symptoms worsen or fail to improve, for also F-up of current med issues as previously d/c pt.     Note: This document was prepared occasionally using Dragon voice recognition software and may include unintentional dictation errors in addition to a  scribe.  This document serves as a record of services personally performed by [name], DO. It was created on her behalf by Toni Amend, a trained medical scribe. The creation of this record is based on the scribe's personal observations and the provider's statements to them.   I have reviewed the above medical documentation for accuracy and completeness and I concur.  [name] 02/27/18 8:19  AM   --------------------------------------------------------------------------------------------------------------------------------------------------------------------------------------------------------------------------------------------    Subjective:    CC:  Chief Complaint  Patient presents with  . Shoulder Pain    HPI: Rachel Simmons is a 52 y.o. female who presents to [place] at [facility] today for issues as discussed below.  Is here today for recurrent acute left-sided shoulder pain.  Last occurrence was 6 months ago.  Notes that if she turns to the left, the pain exacerbates; if she looks to the right, it feels fine. Pain radiates partially down her arm.  Notes "it's almost throbbing through her shoulder" and goes down to mid upper arm. Feels that muscles are hurting there "because it's pulling on her neck."  Has been doing ice cup massage and using a little bit of heat.  Notes using ibuprofen around the clock religiously.  Notes that her sugars have been good lately.  Exercises at least an hour every day - does cardio 4 days a week, and machines 3 days a week.   No problems updated.   Wt Readings from Last 3 Encounters:  02/22/18 253 lb 11.2 oz (115.1 kg)  11/01/17 254 lb (115.2 kg)  10/10/17 253 lb 4.8 oz (114.9 kg)   BP Readings from Last 3 Encounters:  02/22/18 118/80  11/01/17 119/85  10/10/17 120/76   BMI Readings from Last 3 Encounters:  02/22/18 36.93 kg/m  11/01/17 36.97 kg/m  10/10/17 38.23 kg/m     Patient Care Team    Relationship Specialty Notifications Start End  [name], DO PCP - General Family Medicine  08/18/16   Marice Potter, MD Consulting Physician Oncology  08/29/17   Marin Comment, My [place], [place] Referring Physician Optometry  10/10/17      Patient Active Problem List   Diagnosis Date Noted  . Hyperlipidemia associated with type 2 diabetes mellitus ([place]) 08/29/2017    Priority: High  . Obesity, Class III,  BMI 40-49.9 (morbid obesity) ([place]) 08/20/2016    Priority: High  . Type II Diabetes mellitus without complication ([place]) 84/13/2440    Priority: High  . Hypertension associated with diabetes ([place]) 08/18/2016    Priority: High  . Thyroid disease 08/18/2016    Priority: High  . Diabetic peripheral neuropathy ([place]) 04/21/2015    Priority: High  . Hypertriglyceridemia 10/18/2016    Priority: Medium  . Insomnia 10/18/2016    Priority: Medium  . 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 Adjustment disorder with mixed anxiety and depressed mood 08/20/2016    Priority: Medium  . Chronic lymphoblastic leukemia ([place]) 08/18/2016    Priority: Low  . Family history of breast cancer     Priority: Low  . Family history of colon cancer- brother age 67     Priority: Low  . Left tennis elbow 10/10/2017  . Muscle dysfunction/ spasm- upper traps on L 10/10/2017  . Hypothyroidism 08/29/2017  . Family history of colon cancer requiring screening colonoscopy 08/20/2016  . Chronic back pain greater than 3 months duration 08/18/2016  . Genetic testing 05/21/2015  . Family history of  polyps in the colon     Past Medical history, Surgical history, Family history, Social history, Allergies and Medications have been entered into the medical record, reviewed and changed as needed.    Current Meds  Medication Sig  . aspirin 81 MG chewable tablet Chew 81 mg by mouth daily.  Marland Kitchen atorvastatin (LIPITOR) 20 MG tablet Take 0.5 tablets (10 mg total) by mouth at bedtime.  . calcium carbonate (CALCIUM 600) 600 MG TABS tablet Take 1 tablet by mouth daily.  . cetirizine (ZYRTEC) 5 MG tablet Take 1 tablet by mouth daily.  . Cholecalciferol (VITAMIN D3) 5000 units TABS 5,000 IU OTC vitamin D3 daily.  Marland Kitchen escitalopram (LEXAPRO) 20 MG tablet Take 1 tablet (20 mg total) by mouth daily.  Marland Kitchen gabapentin (NEURONTIN) 300 MG capsule TAKE 1 CAPSULE THREE TIMES A DAY  . glucose blood test strip (Freestyle  lite) check FBS and 2 hr PP  . LEVOXYL 112 MCG tablet TAKE 1 TABLET DAILY  . liraglutide 18 MG/3ML SOPN Inject 0.1 mLs (0.6 mg total) into the skin daily. Inc 0.6 weekly as tol  to a max of 3 mg/d  . lisinopril-hydrochlorothiazide (PRINZIDE,ZESTORETIC) 20-25 MG tablet Take 1 tablet daily by mouth.  . metFORMIN (GLUCOPHAGE-XR) 500 MG 24 hr tablet Take 1 tablet (500 mg total) by mouth at bedtime.  . Multiple Vitamins-Minerals (ONE-A-DAY WOMENS 50 PLUS PO) Take 1 tablet daily by mouth.  . Omega-3 Fatty Acids (FISH OIL) 1200 MG CAPS Take 4-5 tabs daily  . Vitamin D, Ergocalciferol, (DRISDOL) 50000 units CAPS capsule TAKE 1 CAPSULE EVERY 7 DAYS    Allergies:  No Known Allergies   Review of Systems: General:   Denies fever, chills, unexplained weight loss.  Optho/Auditory:   Denies visual changes, blurred vision/LOV Respiratory:   Denies wheeze, DOE more than baseline levels.  Cardiovascular:   Denies chest pain, palpitations, new onset peripheral edema  Gastrointestinal:   Denies nausea, vomiting, diarrhea, abd pain.  Genitourinary: Denies dysuria, freq/ urgency, flank pain or discharge from genitals.  Endocrine:     Denies hot or cold intolerance, polyuria, polydipsia. Musculoskeletal:   Denies unexplained myalgias, joint swelling, unexplained arthralgias, gait problems.  Skin:  Denies new onset rash, suspicious lesions Neurological:     Denies dizziness, unexplained weakness, numbness  Psychiatric/Behavioral:   Denies mood changes, suicidal or homicidal ideations, hallucinations    Objective:   Blood pressure 118/80, pulse 70, height 5' 9.5" (1.765 m), weight 253 lb 11.2 oz (115.1 kg), SpO2 99 %. Body mass index is 36.93 kg/m. General:  Well Developed, well nourished, appropriate for stated age.  Neuro:  Alert and oriented,  extra-ocular muscles intact  HEENT:  Normocephalic, atraumatic, neck supple Skin:  no gross rash, warm, pink. Cardiac:  RRR, S1 S2 Respiratory:  ECTA B/L and  A/P, Not using accessory muscles, speaking in full sentences- unlabored. Vascular:  Ext warm, no cyanosis apprec.; cap RF less 2 sec. Psych:  No HI/SI, judgement and insight good, Euthymic mood. Full Affect. Musculoskeletal/Left Shoulder Pain: Paraspinal tenderness along the medial border of her left scapula with a lot of trigger points in that area.

## 2018-04-15 ENCOUNTER — Other Ambulatory Visit: Payer: Self-pay | Admitting: Family Medicine

## 2018-04-25 DIAGNOSIS — C9110 Chronic lymphocytic leukemia of B-cell type not having achieved remission: Secondary | ICD-10-CM

## 2018-04-25 LAB — CBC AND DIFFERENTIAL
HCT: 41 (ref 36–46)
HEMOGLOBIN: 13.6 (ref 12.0–16.0)
Platelets: 259 (ref 150–399)
WBC: 20.9

## 2018-05-09 ENCOUNTER — Telehealth: Payer: Self-pay | Admitting: Family Medicine

## 2018-05-09 NOTE — Telephone Encounter (Signed)
Called patient left message to contact office for provider required OV for Rx med refill.  --glh

## 2018-05-09 NOTE — Telephone Encounter (Signed)
Noted MPulliam, CMA/RT(R)  

## 2018-05-24 ENCOUNTER — Other Ambulatory Visit: Payer: Self-pay | Admitting: Family Medicine

## 2018-05-24 DIAGNOSIS — E119 Type 2 diabetes mellitus without complications: Secondary | ICD-10-CM

## 2018-06-06 ENCOUNTER — Encounter: Payer: Self-pay | Admitting: Family Medicine

## 2018-06-06 ENCOUNTER — Ambulatory Visit (INDEPENDENT_AMBULATORY_CARE_PROVIDER_SITE_OTHER): Admitting: Family Medicine

## 2018-06-06 VITALS — BP 123/76 | HR 85 | Ht 69.5 in | Wt 255.9 lb

## 2018-06-06 DIAGNOSIS — S30861A Insect bite (nonvenomous) of abdominal wall, initial encounter: Secondary | ICD-10-CM

## 2018-06-06 DIAGNOSIS — B373 Candidiasis of vulva and vagina: Secondary | ICD-10-CM | POA: Diagnosis not present

## 2018-06-06 DIAGNOSIS — W57XXXA Bitten or stung by nonvenomous insect and other nonvenomous arthropods, initial encounter: Secondary | ICD-10-CM

## 2018-06-06 DIAGNOSIS — M791 Myalgia, unspecified site: Secondary | ICD-10-CM

## 2018-06-06 DIAGNOSIS — A6920 Lyme disease, unspecified: Secondary | ICD-10-CM

## 2018-06-06 DIAGNOSIS — B3731 Acute candidiasis of vulva and vagina: Secondary | ICD-10-CM | POA: Insufficient documentation

## 2018-06-06 MED ORDER — FLUCONAZOLE 200 MG PO TABS: 200.0000 mg | ORAL_TABLET | ORAL | 0 refills | Status: DC

## 2018-06-06 MED ORDER — TERBINAFINE HCL 1 % EX CREA: TOPICAL_CREAM | CUTANEOUS | 1 refills | Status: DC

## 2018-06-06 MED ORDER — DOXYCYCLINE HYCLATE 100 MG PO TABS: 100.0000 mg | ORAL_TABLET | Freq: Two times a day (BID) | ORAL | 0 refills | Status: DC

## 2018-06-06 NOTE — Progress Notes (Signed)
Pt here for an acute care OV today   Impression and Recommendations:    1. Tick bite of abdomen, initial encounter   2. Erythema migrans (Lyme disease)   3. Myalgias and generalized arthralgias   4. High risk for vaginitis due to Candida secondary to antibiotic use     1. Bullseye Rash (Secondary to Tick Bite 4 Days Ago) - Reviewed that bullseye rash secondary to tick bite is indicative of Lyme Disease.  - Reviewed that if patient had not reported recent tick bite, ringworm would easily be suspected.  Lamisil cream prescribed to cover for possibility of ringworm rash.  - Standard of care for Lyme Disease reviewed with the patient today.  Patient understands the need to cover with antibiotic doxycycline for 21 days.  - Do not drink alcohol while taking antibiotic regimen.  Risks and benefits of doxycycline reviewed with patient today.  - Patient knows to make sure to take full cycle of antibiotics.  Diflucan prescribed if needed for onset of yeast infection secondary to antibiotic use.   Meds ordered this encounter  Medications  . doxycycline (VIBRA-TABS) 100 MG tablet    Sig: Take 1 tablet (100 mg total) by mouth 2 (two) times daily.    Dispense:  28 tablet    Refill:  0  . terbinafine (LAMISIL) 1 % cream    Sig: Apply to affected area twice daily until rash gone, then apply 2 more days.    Dispense:  30 g    Refill:  1  . fluconazole (DIFLUCAN) 200 MG tablet    Sig: Take 1 tablet (200 mg total) by mouth once a week.    Dispense:  2 tablet    Refill:  0     Education and routine counseling performed. Handouts provided  Gross side effects, risk and benefits, and alternatives of medications and treatment plan in general discussed with patient.  Patient is aware that all medications have potential side effects and we are unable to predict every side effect or drug-drug interaction that may occur.   Patient will call with any questions prior to using medication if they  have concerns.  Expresses verbal understanding and consents to current therapy and treatment regimen.  No barriers to understanding were identified.  Red flag symptoms and signs discussed in detail.  Patient expressed understanding regarding what to do in case of emergency\urgent symptoms   Please see AVS handed out to patient at the end of our visit for further patient instructions/ counseling done pertaining to today's office visit.   Return if symptoms worsen or fail to improve, for Follow-up for chronic care as previously discussed.     Note: This document was prepared occasionally using Dragon voice recognition software and may include unintentional dictation errors in addition to a scribe.  This document serves as a record of services personally performed by [name], DO. It was created on her behalf by Toni Amend, a trained medical scribe. The creation of this record is based on the scribe's personal observations and the provider's statements to them.   I have reviewed the above medical documentation for accuracy and completeness and I concur.  [name] 06/20/18 6:03 PM   -----------------------------------------------------------------------------------------------------------    Subjective:    CC:  Chief Complaint  Patient presents with  . Rash    HPI: Rachel Simmons is a 52 y.o. female who presents to [place] at [facility] today for issues as discussed below.  Notes symptoms for about 4-5 days, after tick bite 4 days ago.  Patient was out working in the yard on Saturday; felt a tick bite her, pulled it out.  Had a bullseye rash develop around that site.  Basically denies fever and chills, but patient isn't sure what's going on.  Patient thinks it might be ringworm or a tick bite rash.  When asked if she has had any new fevers or chills, she notes that her left leg is swelling.  States that her joints hurt because she's old, but  adds "they do hurt more than normal."    Problem  Myalgias and generalized arthralgias     Wt Readings from Last 3 Encounters:  06/06/18 255 lb 14.4 oz (116.1 kg)  02/22/18 253 lb 11.2 oz (115.1 kg)  11/01/17 254 lb (115.2 kg)   BP Readings from Last 3 Encounters:  06/06/18 123/76  02/22/18 118/80  11/01/17 119/85   BMI Readings from Last 3 Encounters:  06/06/18 37.25 kg/m  02/22/18 36.93 kg/m  11/01/17 36.97 kg/m     Patient Care Team    Relationship Specialty Notifications Start End  [name], DO PCP - General Family Medicine  08/18/16   Marice Potter, MD Consulting Physician Oncology  08/29/17   Marin Comment, [facility] [place], [place] Referring Physician Optometry  10/10/17      Patient Active Problem List   Diagnosis Date Noted  . Hyperlipidemia associated with type 2 diabetes mellitus ([place]) 08/29/2017    Priority: High  . Obesity, Class III, BMI 40-49.9 (morbid obesity) ([place]) 08/20/2016    Priority: High  . Type II Diabetes mellitus without complication ([place]) 28/78/6767    Priority: High  . Hypertension associated with diabetes ([place]) 08/18/2016    Priority: High  . Thyroid disease 08/18/2016    Priority: High  . Diabetic peripheral neuropathy ([place]) 04/21/2015    Priority: High  . Hypertriglyceridemia 10/18/2016    Priority: Medium  . Insomnia 10/18/2016    Priority: Medium  . 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 Adjustment disorder with mixed anxiety and depressed mood 08/20/2016    Priority: Medium  . Chronic lymphoblastic leukemia ([place]) 08/18/2016    Priority: Low  . Family history of breast cancer     Priority: Low  . Family history of colon cancer- brother age 33     Priority: Low  . Myalgias and generalized arthralgias 06/20/2018  . High risk for vaginitis due to Candida secondary to antibiotic use 06/06/2018  . Left tennis elbow 10/10/2017  . Muscle dysfunction/ spasm- upper traps on L 10/10/2017  . Hypothyroidism  08/29/2017  . Family history of colon cancer requiring screening colonoscopy 08/20/2016  . Chronic back pain greater than 3 months duration 08/18/2016  . Genetic testing 05/21/2015  . Family history of polyps in the colon     Past Medical history, Surgical history, Family history, Social history, Allergies and Medications have been entered into the medical record, reviewed and changed as needed.    Current Meds  Medication Sig  . aspirin 81 MG chewable tablet Chew 81 mg by mouth daily.  Marland Kitchen atorvastatin (LIPITOR) 20 MG tablet Take 0.5 tablets (10 mg total) by mouth at bedtime.  . calcium carbonate (CALCIUM 600) 600 MG TABS tablet Take 1 tablet by mouth daily.  . cetirizine (ZYRTEC) 5 MG tablet Take 1 tablet by mouth daily.  . Cholecalciferol (VITAMIN D3) 5000 units TABS 5,000 IU OTC vitamin D3 daily.  Marland Kitchen  cyclobenzaprine (FLEXERIL) 10 MG tablet Take 1 tablet (10 mg total) by mouth 3 (three) times daily as needed for muscle spasms.  Marland Kitchen escitalopram (LEXAPRO) 20 MG tablet Take 1 tablet (20 mg total) by mouth daily. Patient needs office visit for further refills  . gabapentin (NEURONTIN) 300 MG capsule TAKE 1 CAPSULE THREE TIMES A DAY  . LEVOXYL 112 MCG tablet TAKE 1 TABLET DAILY  . liraglutide 18 MG/3ML SOPN Inject 0.1 mLs (0.6 mg total) into the skin daily. Inc 0.6 weekly as tol  to a max of 3 mg/d  . lisinopril-hydrochlorothiazide (PRINZIDE,ZESTORETIC) 20-25 MG tablet Take 1 tablet daily by mouth.  . metFORMIN (GLUCOPHAGE-XR) 500 MG 24 hr tablet TAKE 2 TABLETS AT BEDTIME  . Multiple Vitamins-Minerals (ONE-A-DAY WOMENS 50 PLUS PO) Take 1 tablet daily by mouth.  . Omega-3 Fatty Acids (FISH OIL) 1200 MG CAPS Take 4-5 tabs daily  . predniSONE (DELTASONE) 20 MG tablet Take 3 tabs po * 2 days, then 2 tabs for 2 d, then 1 tab 2 d, then 1/2 tab 2 days.  . Vitamin D, Ergocalciferol, (DRISDOL) 50000 units CAPS capsule TAKE 1 CAPSULE EVERY 7 DAYS  . [DISCONTINUED] glucose blood test strip (Freestyle  lite) check FBS and 2 hr PP    Allergies:  No Known Allergies   Review of Systems: General:   Denies fever, chills, unexplained weight loss.  Optho/Auditory:   Denies visual changes, blurred vision/LOV Respiratory:   Denies wheeze, DOE more than baseline levels.  Cardiovascular:   Denies chest pain, palpitations, new onset peripheral edema  Gastrointestinal:   Denies nausea, vomiting, diarrhea, abd pain.  Genitourinary: Denies dysuria, freq/ urgency, flank pain or discharge from genitals.  Endocrine:     Denies hot or cold intolerance, polyuria, polydipsia. Musculoskeletal:   + new unexplained myalgias, joint swelling, + generalized unexplained arthralgias, gait problems.  Skin:  + new onset rash, + suspicious lesions Neurological:     Denies dizziness, unexplained weakness, numbness  Psychiatric/Behavioral:   Denies mood changes, suicidal or homicidal ideations, hallucinations    Objective:   Blood pressure 123/76, pulse 85, height 5' 9.5" (1.765 m), weight 255 lb 14.4 oz (116.1 kg), SpO2 98 %. Body mass index is 37.25 kg/m. General:  Well Developed, well nourished, appropriate for stated age.  Neuro:  Alert and oriented,  extra-ocular muscles intact  HEENT:  Normocephalic, atraumatic, neck supple Skin: Patient with bullseye rash approximately 6 cm diameter, left anterior lower abdomen.  One other satellite lesion, right side of abdomen, only 3 cm diameter.  Otherwise, skin appears warm, pink. Cardiac:  RRR, S1 S2 Respiratory:  ECTA B/L and A/P, Not using accessory muscles, speaking in full sentences- unlabored. Vascular:  Ext warm, no cyanosis apprec.; cap RF less 2 sec. Psych:  No HI/SI, judgement and insight good, Euthymic mood. Full Affect.

## 2018-06-06 NOTE — Patient Instructions (Signed)
Please see handout given to you from the [facility] website.

## 2018-06-13 ENCOUNTER — Ambulatory Visit: Admitting: Family Medicine

## 2018-06-20 ENCOUNTER — Other Ambulatory Visit: Payer: Self-pay | Admitting: Family Medicine

## 2018-06-20 DIAGNOSIS — E119 Type 2 diabetes mellitus without complications: Secondary | ICD-10-CM

## 2018-06-20 DIAGNOSIS — M791 Myalgia, unspecified site: Secondary | ICD-10-CM | POA: Insufficient documentation

## 2018-06-25 IMAGING — MG DIGITAL SCREENING BILATERAL MAMMOGRAM WITH CAD
4 series · 4 of 4 positions shown · non-contrast
Comparison: Previous exam(s).

CLINICAL DATA: Screening.

EXAM:
DIGITAL SCREENING BILATERAL MAMMOGRAM WITH CAD

[L MLO]
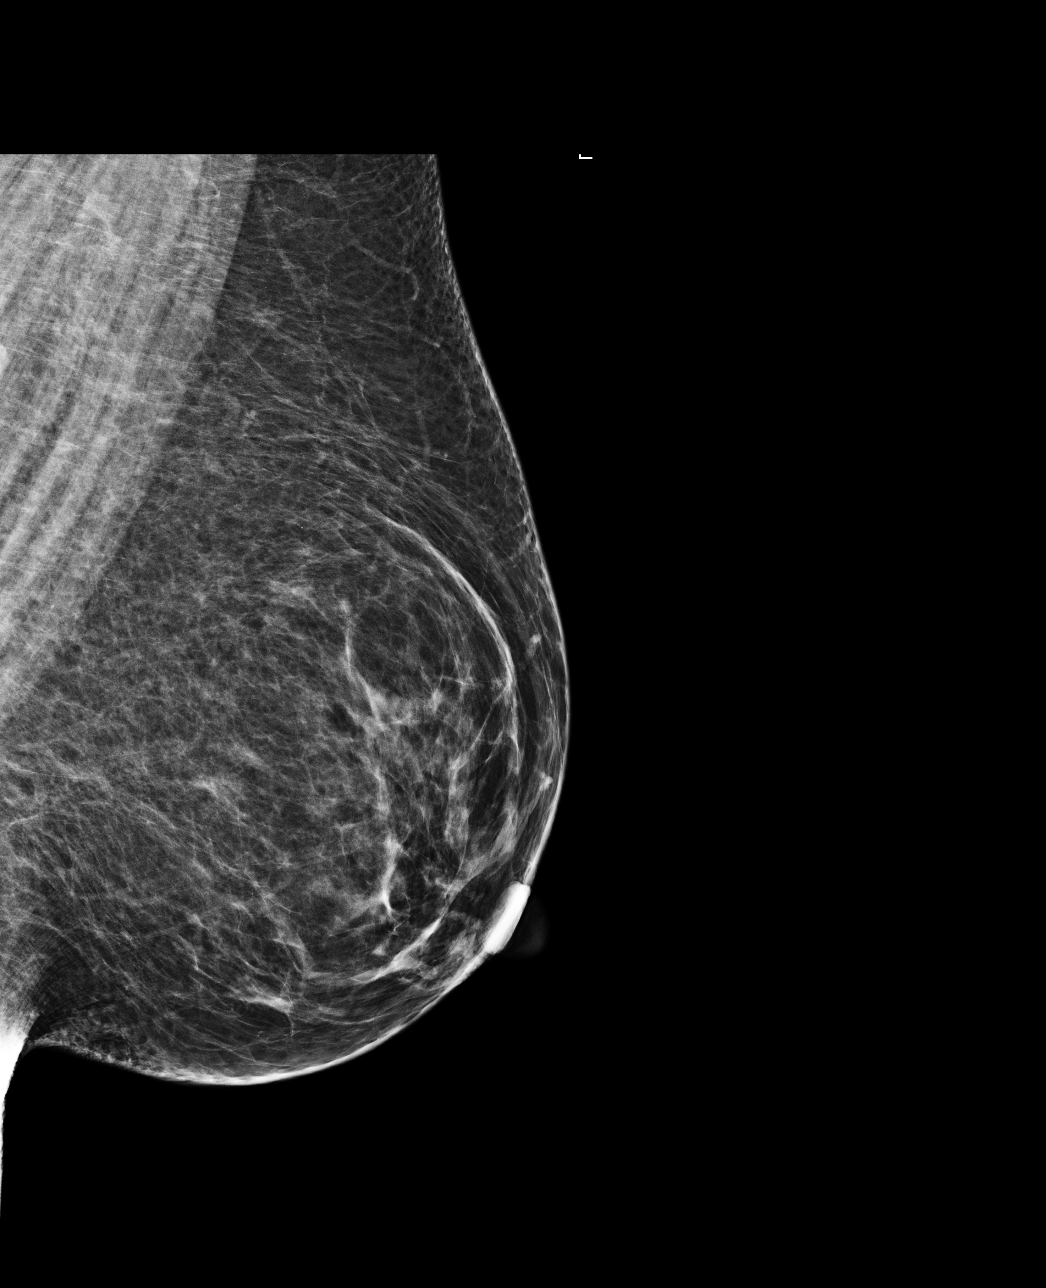

[L CC]
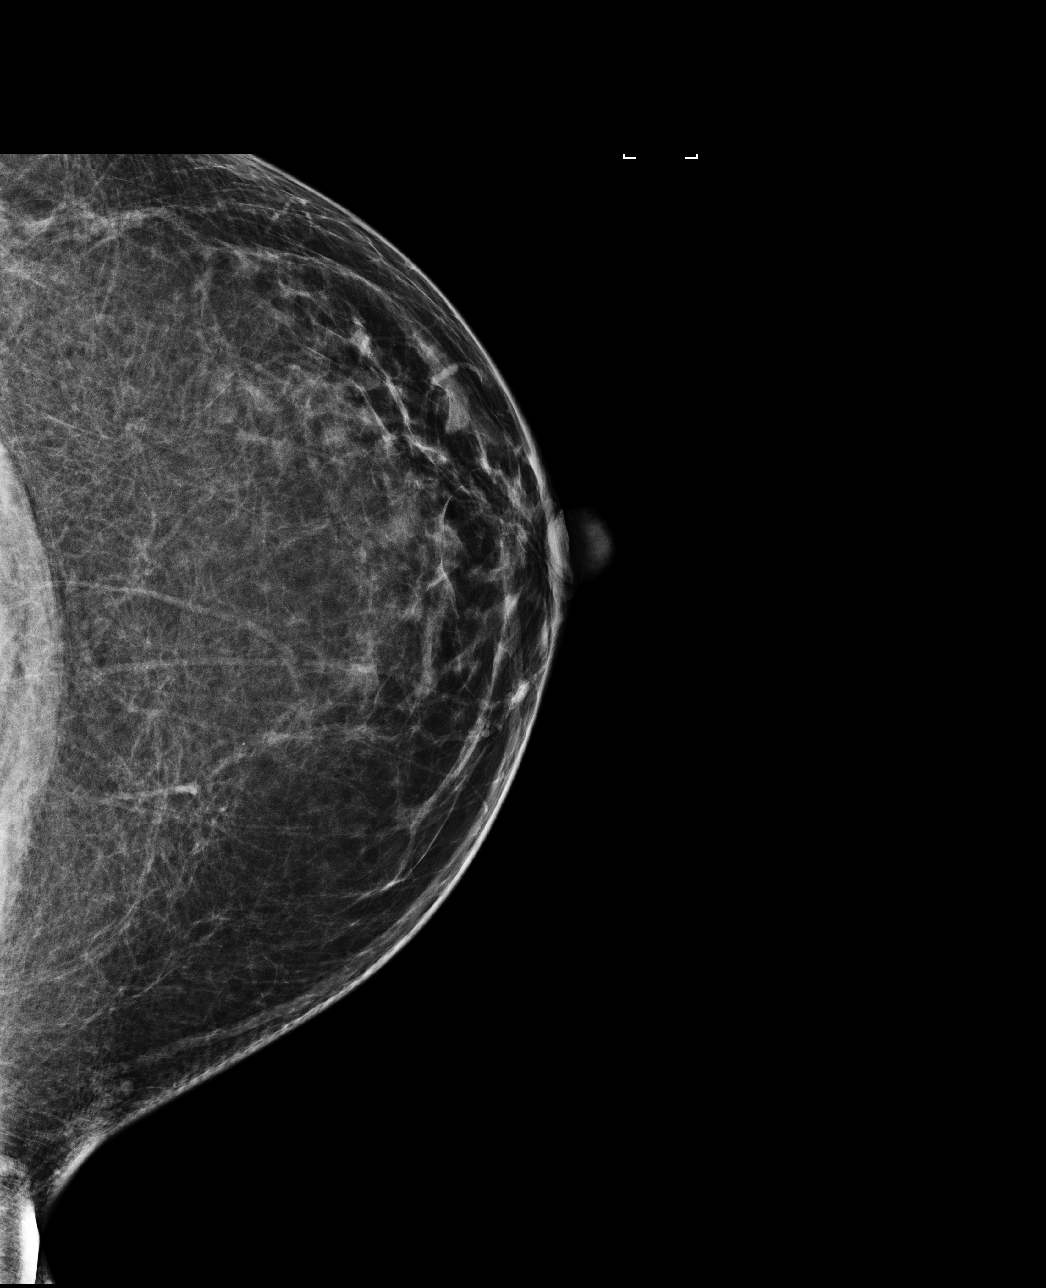

[R MLO]
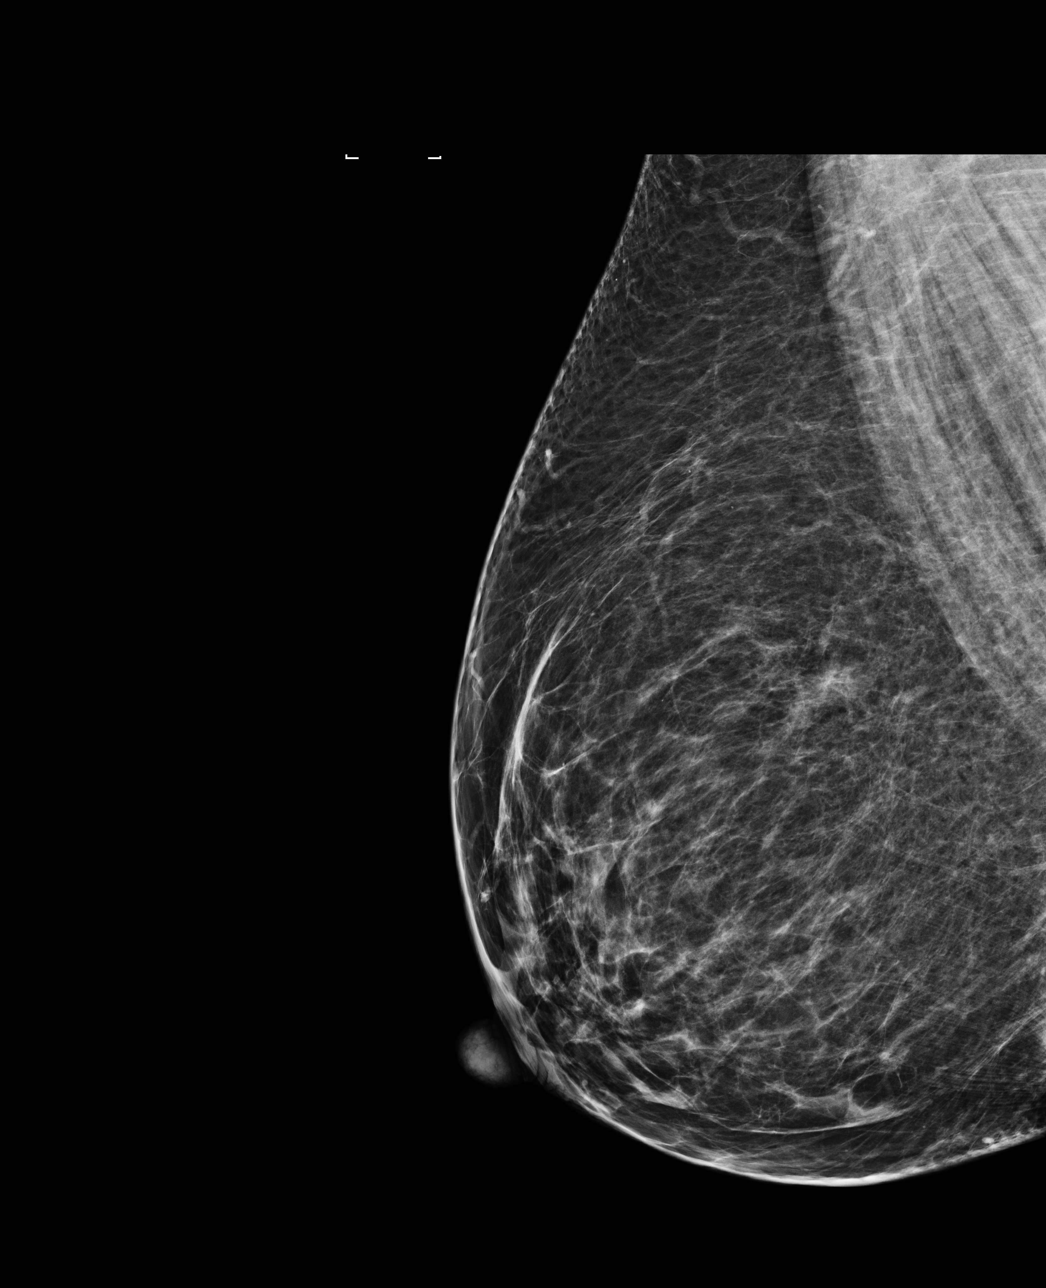

[R CC]
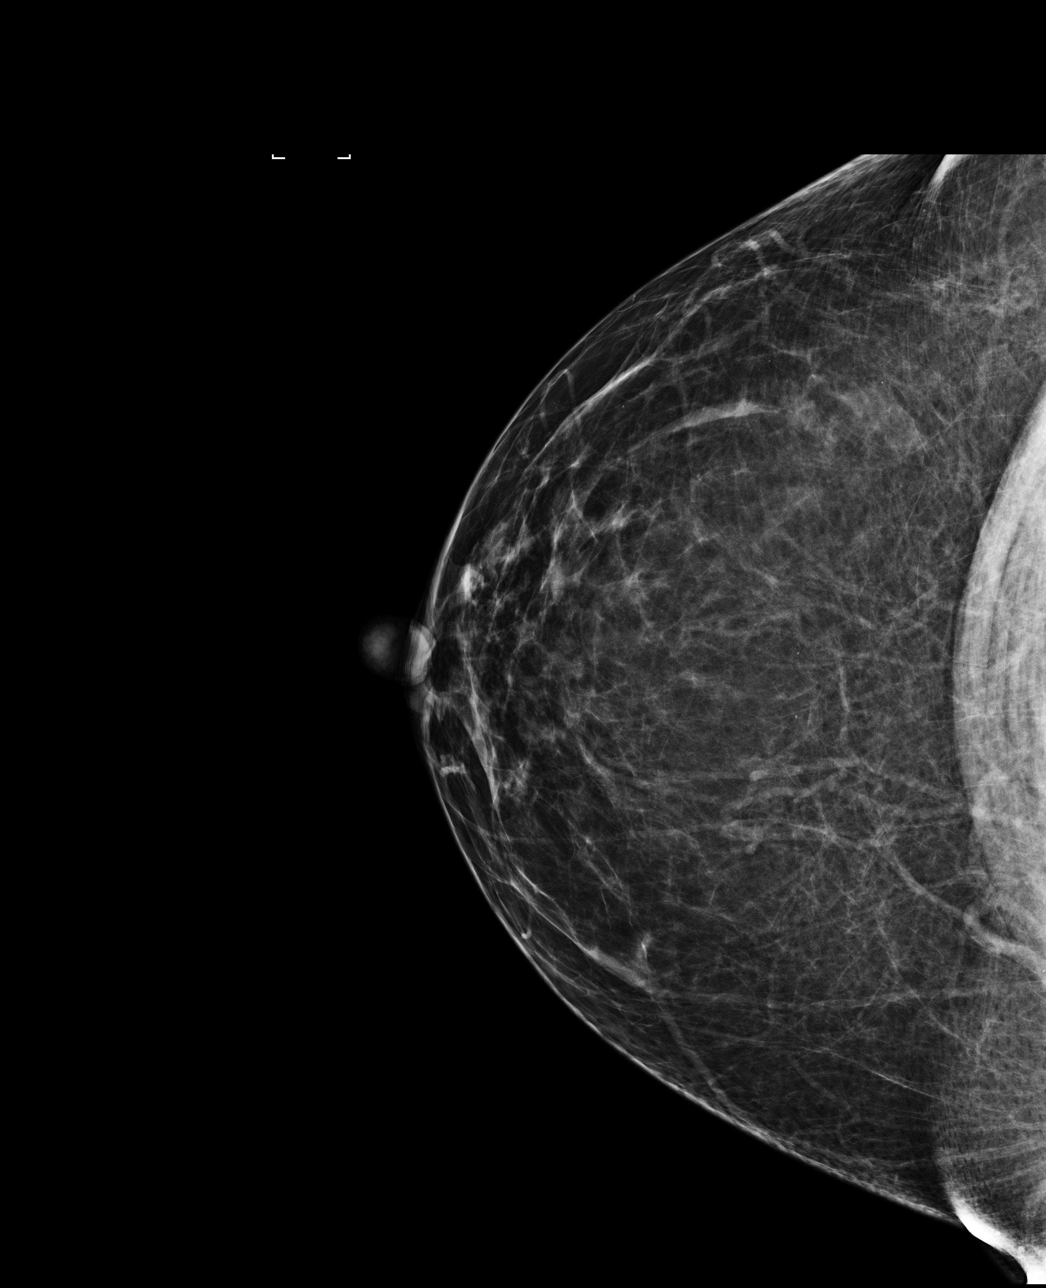

[4 of 4 positions shown; findings below may reference images not displayed]

ACR Breast Density Category b: There are scattered areas of
fibroglandular density.
FINDINGS: There are no findings suspicious for malignancy. Images were
processed with CAD.
IMPRESSION: No mammographic evidence of malignancy. A result letter of this
screening mammogram will be mailed directly to the patient.

RECOMMENDATION:
Screening mammogram in one year. (Code:AS-G-LCT)

BI-RADS CATEGORY  1: Negative.

## 2018-06-28 ENCOUNTER — Other Ambulatory Visit: Payer: Self-pay | Admitting: Family Medicine

## 2018-07-13 ENCOUNTER — Other Ambulatory Visit: Payer: Self-pay | Admitting: Family Medicine

## 2018-07-13 DIAGNOSIS — E1169 Type 2 diabetes mellitus with other specified complication: Secondary | ICD-10-CM

## 2018-07-13 DIAGNOSIS — E785 Hyperlipidemia, unspecified: Principal | ICD-10-CM

## 2018-07-24 ENCOUNTER — Other Ambulatory Visit: Payer: Self-pay | Admitting: Family Medicine

## 2018-07-24 DIAGNOSIS — E039 Hypothyroidism, unspecified: Secondary | ICD-10-CM

## 2018-07-25 ENCOUNTER — Encounter: Payer: Self-pay | Admitting: Family Medicine

## 2018-07-25 ENCOUNTER — Ambulatory Visit (INDEPENDENT_AMBULATORY_CARE_PROVIDER_SITE_OTHER): Admitting: Family Medicine

## 2018-07-25 VITALS — BP 110/78 | HR 75 | Temp 98.5°F | Ht 69.5 in | Wt 256.0 lb

## 2018-07-25 DIAGNOSIS — R062 Wheezing: Secondary | ICD-10-CM

## 2018-07-25 DIAGNOSIS — B9689 Other specified bacterial agents as the cause of diseases classified elsewhere: Secondary | ICD-10-CM

## 2018-07-25 DIAGNOSIS — J019 Acute sinusitis, unspecified: Secondary | ICD-10-CM

## 2018-07-25 DIAGNOSIS — R51 Headache: Secondary | ICD-10-CM

## 2018-07-25 DIAGNOSIS — R519 Headache, unspecified: Secondary | ICD-10-CM

## 2018-07-25 DIAGNOSIS — R05 Cough: Secondary | ICD-10-CM

## 2018-07-25 DIAGNOSIS — R058 Other specified cough: Secondary | ICD-10-CM

## 2018-07-25 MED ORDER — AMOXICILLIN-POT CLAVULANATE 875-125 MG PO TABS: 1.0000 | ORAL_TABLET | Freq: Two times a day (BID) | ORAL | 0 refills | Status: AC

## 2018-07-25 MED ORDER — METHYLPREDNISOLONE ACETATE 80 MG/ML IJ SUSP
80.0000 mg | Freq: Once | INTRAMUSCULAR | Status: AC
  Administered 2018-07-25: 80 mg via INTRAMUSCULAR

## 2018-07-25 MED ORDER — HYDROCOD POLST-CPM POLST ER 10-8 MG/5ML PO SUER: 5.0000 mL | Freq: Two times a day (BID) | ORAL | 0 refills | Status: DC | PRN

## 2018-07-25 MED ORDER — PREDNISONE 20 MG PO TABS: ORAL_TABLET | ORAL | 0 refills | Status: DC

## 2018-07-25 NOTE — Patient Instructions (Signed)
Baker Janus please give her out of work fortoday and tomorrow due to illness.     Upper respiratory viral infection, Adult Adenoviruses are common viruses that cause many different types of infections. The viruses usually affect the lungs, but they can also affect other parts of the body, including the eyes, stomach, bowels, bladder, and brain. The most common type of adenovirus infection is the common cold. Usually, adenovirus infections are not severe unless you have another health problem that makes it hard for your body to fight off infection. What are the causes? You can get this condition if you:  Touch a surface or object that has an adenovirus on it and then touch your mouth, nose, or eyes with unwashed hands.  Come into close physical contact with an infected person, such as by hugging or shaking hands.  Breathe in droplets that fly through the air when an infected person talks, coughs, or sneezes.  Have contact with infected stool.  Swim in a pool that does not have enough chlorine.  Adenoviruses can live outside the body for many weeks. They spread easily from person to person (are contagious). What increases the risk? This condition is more likely to develop in:  People who spend a lot of time in places where there are many people, such as schools, summer camps, daycare centers, community centers, and [employer] recruit training centers.  Elderly adults.  People with a weak body defense system (immune system).  People with a lung disease.  People with a heart condition.  What are the signs or symptoms? Adenovirus infections usually cause flu-like symptoms. Once the virus gets into the body, symptoms of this condition can take up to 14 days to develop. Symptoms may include:  Headache.  Stiff neck.  Sleepiness or fatigue.  Confusion or disorientation.  Fever.  Sore throat.  Cough.  Trouble breathing.  Runny nose or congestion.  Pink eye  (conjunctivitis).  Bleeding into the covering of the eye.  Stomachache or diarrhea.  Nausea or vomiting.  Blood in the urine or pain while urinating.  Ear pain or fullness.  How is this diagnosed? This condition may be diagnosed based on your symptoms and a physical exam. Your health care provider may order tests to make sure your symptoms are not caused by another type of problem. Tests can include:  Blood tests.  Urine tests.  Stool tests.  Chest X-ray.  Tissue or throat culture.  How is this treated? This condition goes away on its own with time. Treatment for this condition involves managing symptoms until the condition goes away. Your health care provider may recommend:  Rest.  Drinking more fluids.  Taking over-the-counter medicine to help relieve a sore throat, fever, or headache.  Follow these instructions at home:  Rest at home until your symptoms go away.  Drink enough fluid to keep your urine clear or pale yellow.  Take over-the-counter and prescription medicines only as told by your health care provider.  Keep all follow-up visits as told by your health care provider. This is important. How is this prevented? Adenoviruses are resistant to many cleaning products and can remain on surfaces for long periods of time. To help prevent infection:  Wash your hands often with soap and water.  Cover your nose or mouth when you sneeze or cough.  Do not touch your eyes, nose, or mouth with unwashed hands.  Clean commonly used objects often.  Do not swim in a pool that is not properly chlorinated.  Avoid close contact with people who are sick.  Do not go to school or work when you are sick.  Contact a health care provider if:  Your symptoms do not improve after 10 days.  Your symptoms get worse.  You cannot eat or drink without vomiting. Get help right away if:  You have trouble breathing or you are breathing rapidly.  Your skin, lips, or  fingernails look blue (cyanosis).  You have a rapid heart rate.  You become confused.  You lose consciousness. This information is not intended to replace advice given to you by your health care provider. Make sure you discuss any questions you have with your health care provider. Document Released: 02/11/2003 Document Revised: 07/18/2016 Document Reviewed: 07/18/2016 Elsevier Interactive Patient Education  [employer].

## 2018-07-25 NOTE — Progress Notes (Signed)
Pt here for an acute care OV today   Impression and Recommendations:    1. Acute bacterial rhinosinusitis   2. Productive cough   3. Wheezes=- subjective   4. Acute nonintractable headache, unspecified headache type    -Augmentin, tussinex, prednisone prescribed; see med list below -Ordered steroid shot today in office to be completed during OV -Discussed OTC decongestants such as Mucinex DM PRN -Encouraged pt to contact us if wheezing or SOB worsen for further evaluation; patient already has albuterol at home that she has been using as needed and declines refill -Wrote pt orders Not to return to work until Friday    Meds ordered this encounter  Medications  . amoxicillin-clavulanate (AUGMENTIN) 875-125 MG tablet    Sig: Take 1 tablet by mouth 2 (two) times daily for 10 days.    Dispense:  20 tablet    Refill:  0  . chlorpheniramine-HYDROcodone (TUSSIONEX) 10-8 MG/5ML SUER    Sig: Take 5 mLs by mouth every 12 (twelve) hours as needed for cough (cough, will cause drowsiness.).    Dispense:  200 mL    Refill:  0  . predniSONE (DELTASONE) 20 MG tablet    Sig: (start 8/22) Take 3 pills a day for 2 days, 2 pills a day for 2 days, 1 pill a day for 2 days then one half pill a day for 2 days then off    Dispense:  14 tablet    Refill:  0  . methylPREDNISolone acetate (DEPO-MEDROL) injection 80 mg    Medications Discontinued During This Encounter  Medication Reason  . cyclobenzaprine (FLEXERIL) 10 MG tablet No longer needed (for PRN medications)  . doxycycline (VIBRA-TABS) 100 MG tablet Completed Course  . fluconazole (DIFLUCAN) 200 MG tablet Completed Course  . gabapentin (NEURONTIN) 300 MG capsule No longer needed (for PRN medications)  . liraglutide 18 MG/3ML SOPN Discontinued by provider  . predniSONE (DELTASONE) 20 MG tablet Completed Course  . terbinafine (LAMISIL) 1 % cream Completed Course      Education and routine counseling performed. Handouts provided  Gross  side effects, risk and benefits, and alternatives of medications and treatment plan in general discussed with patient.  Patient is aware that all medications have potential side effects and we are unable to predict every side effect or drug-drug interaction that may occur.   Patient will call with any questions prior to using medication if they have concerns.  Expresses verbal understanding and consents to current therapy and treatment regimen.  No barriers to understanding were identified.  Red flag symptoms and signs discussed in detail.  Patient expressed understanding regarding what to do in case of emergency\urgent symptoms   Please see AVS handed out to patient at the end of our visit for further patient instructions/ counseling done pertaining to today's office visit.   Return if symptoms worsen or fail to improve, for Keep chronic follow-up OV's.     Note:  This document was prepared occasionally using Dragon voice recognition software and may include unintentional dictation errors in addition to a scribe.   This document serves as a record of services personally performed by [name], MD. It was created on her behalf by Georga Bora, a trained medical scribe. The creation of this record is based on the scribe's personal observations and the provider's statements to them.   I have reviewed the above medical documentation for accuracy and completeness and I concur.  [name] 07/25/18 1:11 PM   --------------------------------------------------------------------------------------------------------------------------------------------------------------------------------------------------------------------------------------------  Subjective:    CC:  Chief Complaint  Patient presents with  . URI    HPI: Rachel Simmons is a 52 y.o. female who presents to [place] [facility] today for issues as discussed below.   Acute -States she went on a  business trip with 8 coworkers in a [place] and "most everyone has been feeling bad"  -Symptom onset 7 days ago include headache, earache, store throat, sneezing, coughing, post-nasal drip -Has been experiencing wheezing and shortness of breath "from the chest congestion;" which has now been issues past couple days-  used prescription inhaler for relief that she already had at home given to her in the past ( albuterol) -States headache has been constant - not one sided -Checked her own temperature several nights ago and had a temperature of 102-o, but fever broke the next morning and temperature is currently normal - overall : no improvement and a little worse- now sx into chest and wh     Wt Readings from Last 3 Encounters:  07/25/18 256 lb (116.1 kg)  06/06/18 255 lb 14.4 oz (116.1 kg)  02/22/18 253 lb 11.2 oz (115.1 kg)   BP Readings from Last 3 Encounters:  07/25/18 110/78  06/06/18 123/76  02/22/18 118/80   BMI Readings from Last 3 Encounters:  07/25/18 37.26 kg/m  06/06/18 37.25 kg/m  02/22/18 36.93 kg/m     Patient Care Team    Relationship Specialty Notifications Start End  [name], DO PCP - General Family Medicine  08/18/16   Marice Potter, MD Consulting Physician Oncology  08/29/17   Marin Comment, [facility] [place], [place] Referring Physician Optometry  10/10/17      Patient Active Problem List   Diagnosis Date Noted  . Hyperlipidemia associated with type 2 diabetes mellitus ([place]) 08/29/2017    Priority: High  . Obesity, Class III, BMI 40-49.9 (morbid obesity) ([place]) 08/20/2016    Priority: High  . Type II Diabetes mellitus without complication ([place]) 40/07/6760    Priority: High  . Hypertension associated with diabetes ([place]) 08/18/2016    Priority: High  . Thyroid disease 08/18/2016    Priority: High  . Diabetic peripheral neuropathy ([place]) 04/21/2015    Priority: High  . Hypertriglyceridemia 10/18/2016    Priority: Medium  . Insomnia 10/18/2016    Priority: Medium  .  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 Adjustment disorder with mixed anxiety and depressed mood 08/20/2016    Priority: Medium  . Chronic lymphoblastic leukemia ([place]) 08/18/2016    Priority: Low  . Family history of breast cancer     Priority: Low  . Family history of colon cancer- brother age 14     Priority: Low  . Myalgias and generalized arthralgias 06/20/2018  . High risk for vaginitis due to Candida secondary to antibiotic use 06/06/2018  . Left tennis elbow 10/10/2017  . Muscle dysfunction/ spasm- upper traps on L 10/10/2017  . Hypothyroidism 08/29/2017  . Family history of colon cancer requiring screening colonoscopy 08/20/2016  . Chronic back pain greater than 3 months duration 08/18/2016  . Genetic testing 05/21/2015  . Family history of polyps in the colon     Past Medical history, Surgical history, Family history, Social history, Allergies and Medications have been entered into the medical record, reviewed and changed as needed.    Current Meds  Medication Sig  . aspirin 81 MG chewable tablet Chew 81 mg by mouth daily.  Marland Kitchen  atorvastatin (LIPITOR) 20 MG tablet TAKE 1 TABLET AT BEDTIME  . calcium carbonate (CALCIUM 600) 600 MG TABS tablet Take 1 tablet by mouth daily.  . cetirizine (ZYRTEC) 5 MG tablet Take 1 tablet by mouth daily.  . Cholecalciferol (VITAMIN D3) 5000 units TABS 5,000 IU OTC vitamin D3 daily.  Marland Kitchen escitalopram (LEXAPRO) 20 MG tablet TAKE 1 TABLET DAILY (NEED OFFICE VISIT FOR FURTHER REFILLS)  . glucose blood (FREESTYLE LITE) test strip CHECK FASTING BLOOD SUGAR AND 2 HOURS AFTER A MEAL  . LEVOXYL 112 MCG tablet TAKE 1 TABLET DAILY  . lisinopril-hydrochlorothiazide (PRINZIDE,ZESTORETIC) 20-25 MG tablet Take 1 tablet daily by mouth.  . metFORMIN (GLUCOPHAGE-XR) 500 MG 24 hr tablet TAKE 2 TABLETS AT BEDTIME (Patient taking differently: Take 500 mg by mouth at bedtime. )  . Multiple Vitamins-Minerals (ONE-A-DAY WOMENS 50 PLUS PO)  Take 1 tablet daily by mouth.  . Omega-3 Fatty Acids (FISH OIL) 1200 MG CAPS Take 4-5 tabs daily  . SAXENDA 18 MG/3ML SOPN TAKE AS INSTRUCTED BY YOUR PRESCRIBER  . Vitamin D, Ergocalciferol, (DRISDOL) 50000 units CAPS capsule TAKE 1 CAPSULE EVERY 7 DAYS    Allergies:  No Known Allergies   Review of Systems: General:   Denies current fever, chills, unexplained weight loss.  Optho/Auditory:   Denies visual changes, blurred vision/LOV Respiratory:   Some wheezing and SOB due to congestion , DOE more than baseline levels.   Cardiovascular:   Denies chest pain, palpitations, new onset peripheral edema  Gastrointestinal:   Denies nausea, vomiting, diarrhea, abd pain.  Genitourinary: Denies dysuria, freq/ urgency, flank pain or discharge from genitals.  Endocrine:     Denies hot or cold intolerance, polyuria, polydipsia. Musculoskeletal:   Denies unexplained myalgias, joint swelling, unexplained arthralgias, gait problems.  Skin:  Denies new onset rash, suspicious lesions Neurological:     Denies dizziness, unexplained weakness, numbness  Psychiatric/Behavioral:   Denies mood changes, suicidal or homicidal ideations, hallucinations    Objective:   Blood pressure 110/78, pulse 75, temperature 98.5 F (36.9 C), height 5' 9.5" (1.765 m), weight 256 lb (116.1 kg), SpO2 100 %. Body mass index is 37.26 kg/m. General:  Well Developed, well nourished, appropriate for stated age.  Neuro:  Alert and oriented,  extra-ocular muscles intact  HEENT:  Left TM: dec light reflex but present, TM opaque and bulging though; right essentially within normal limits but mild bulge.  Nares edematous and erythematous, clear drainage.  Oropharynx clear with mild erythema from postnasal drip.  No exudate.  Neck: Supple, mildly tender to palpation anterior cervical lymphadenopathy. Skin:  no gross rash, warm, pink. Cardiac:  RRR, S1 S2 Respiratory:  ECTA B/L and A/P, no crackles, wheezes but globally decreased. Not  using accessory muscles, speaking in full sentences- unlabored. Vascular:  Ext warm, no cyanosis apprec.; cap RF less 2 sec. Psych:  No HI/SI, judgement and insight good, Euthymic mood. Full Affect.

## 2018-08-01 ENCOUNTER — Ambulatory Visit (INDEPENDENT_AMBULATORY_CARE_PROVIDER_SITE_OTHER): Admitting: Family Medicine

## 2018-08-01 ENCOUNTER — Encounter: Payer: Self-pay | Admitting: Family Medicine

## 2018-08-01 VITALS — BP 120/85 | HR 73 | Ht 70.0 in | Wt 257.0 lb

## 2018-08-01 DIAGNOSIS — E1159 Type 2 diabetes mellitus with other circulatory complications: Secondary | ICD-10-CM

## 2018-08-01 DIAGNOSIS — E119 Type 2 diabetes mellitus without complications: Secondary | ICD-10-CM

## 2018-08-01 DIAGNOSIS — E785 Hyperlipidemia, unspecified: Secondary | ICD-10-CM

## 2018-08-01 DIAGNOSIS — E1142 Type 2 diabetes mellitus with diabetic polyneuropathy: Secondary | ICD-10-CM | POA: Diagnosis not present

## 2018-08-01 DIAGNOSIS — E1169 Type 2 diabetes mellitus with other specified complication: Secondary | ICD-10-CM | POA: Diagnosis not present

## 2018-08-01 DIAGNOSIS — I152 Hypertension secondary to endocrine disorders: Secondary | ICD-10-CM

## 2018-08-01 DIAGNOSIS — I10 Essential (primary) hypertension: Secondary | ICD-10-CM

## 2018-08-01 DIAGNOSIS — F4323 Adjustment disorder with mixed anxiety and depressed mood: Secondary | ICD-10-CM

## 2018-08-01 LAB — POCT GLYCOSYLATED HEMOGLOBIN (HGB A1C): HEMOGLOBIN A1C: 6.3 % — AB (ref 4.0–5.6)

## 2018-08-01 NOTE — Progress Notes (Signed)
Impression and Recommendations:    1. Type II Diabetes mellitus without complication ([place])   2. Hypertension associated with diabetes ([place])   3. Hyperlipidemia associated with type 2 diabetes mellitus ([place])   4. Diabetic peripheral neuropathy (HCC)   5. Obesity, Class III, BMI 40-49.9 (morbid obesity) ([place])   6. Adjustment disorder with mixed anxiety and depressed mood     BMI -Encouraged pt to add her coworkers to [employer] in order to increase accountability and explained the need for tracking her food -Discussed ways to increase physical activity and meeting AHA guidelines -Discussed the importance of reviewing her diet in order to see what foods are issues for her   HTN -Blood pressure is at goal today.  We will continue current medications. -Discussed positive impact of increasing exercise on blood pressure -Discussed negative impact of stress and eating poorly on HTN -Encouraged pt to continue monitoring blood pressure and to contact us if she notices unusual changes -Recommended pt to increase their water intake to half of their body weight in ounces.  DM -A1c 6.3 today-stable but up from 5.6 last time.   -We will continue Saxenda and metformin.  Patient is encouraged to get back on her dietary routine. -Discussed positive impact of eating healthy foods like vegetables and lean meat on diabetes -Educated pt on necessity of rechecking A1C   Diabetic neuropathy: -symptoms of neuropathy are stable patient has no complaints today.   -Diabetic foot exam is up-to-date and was done today.  It was normal.  CHol:  -Cholesterol stable.  Last LDL was checked in September. -Continue current medications which she is tolerating well. -Discussed additional dietary and lifestyle modification which patient will get back onto in the very near future.  Mood -Discussed pt mood overall-stable doing well on meds.  Continue current medications without change.; pt feels she is happy  and doing well -Discussed impact of exercise and healthy diet on improving mood -Encouraged pt to continue working with her group at work for emotional support during lifestyle changes  Health Management -Discussed importance of regular physicals for disease prevention and health care -Educated pt on importance of regular gynecological exams  -Requested pt make a follow up appointment within 4-6 weeks for a physical and FBW   Orders Placed This Encounter  Procedures  . POCT glycosylated hemoglobin (Hb A1C)     Return for CPE/ yrly physical, come fasting around 1-2 mo; then chronic F/up MMP 56mo   The patient was counseled, risk factors were discussed, anticipatory guidance given.  Gross side effects, risk and benefits, and alternatives of medications discussed with patient.  Patient is aware that all medications have potential side effects and we are unable to predict every side effect or drug-drug interaction that may occur.  Expresses verbal understanding and consents to current therapy plan and treatment regimen.  Please see AVS handed out to patient at the end of our visit for further patient instructions/ counseling done pertaining to today's office visit.     I have reviewed the above documentation for accuracy and completeness, and I agree with the above.    This document serves as a record of services personally performed by DMellody Dance MD. It was created on her behalf by LGeorga Bora a trained medical scribe. The creation of this record is based on the scribe's personal observations and the provider's statements to them.   I have reviewed the above medical documentation for accuracy and completeness and I concur.  [name] 08/01/18 10:40 AM     Subjective:    Chief Complaint  Patient presents with  . Follow-up     Rachel Simmons is a 52 y.o. female who presents to [place] at [facility] today for Diabetes Management, follow-up  cholesterol, blood pressure, and weight management.    Lifestyle -Pt works at [employer] with a younger group of [place] they have helped her become more motivated to change her lifestyle -States she has been working on eating better and has influenced her husband to improve also  Mood -States she has found ways to deal with stress with encouragement from her work team -Realized she eats when she is stressed or upset and has started finding other ways to relax  -Believes the support from her group and being more active has really improved her mood  BMI -States she and her coworkers started a "fitness freaks" group and have been making lifestyle changes -Says she and her friends have been drinking 2 gallons of water a day, doing 250 crunches a day, and walking a specific number of states  -Her friends have created a group chat to improve accountability and work together towards improving health -Recognizes her triggers and has been using other ways to replace eating for stress such as playing with her dogs, going for a walk, or talking to her groupchat   1. HTN HPI:  -  Her blood pressure has been controlled at home.    -Pt checks bp every day and is usually around 110/75  - Patient reports good compliance with blood pressure medications  - Denies medication S-E   - Smoking Status noted   - She denies new onset of: chest pain, exercise intolerance, shortness of breath, dizziness, visual changes, headache, lower extremity swelling or claudication.   Today their BP is BP: 120/85   Last 3 blood pressure readings in our office are as follows: BP Readings from Last 3 Encounters:  08/01/18 120/85  07/25/18 110/78  06/06/18 123/76    Filed Weights   08/01/18 0956  Weight: 257 lb (116.6 kg)    CHOL HPI:   -  She  is currently managed with: -Pt has been taking lipitor every day   -Denies SE of her current medication  Txmnt compliance- good- taking  nightly  Muscle aches- none  No other s-e  Last lipid panel as follows:  Lab Results  Component Value Date   CHOL 91 (L) 08/09/2017   HDL 42 08/09/2017   LDLCALC 21 08/09/2017   TRIG 138 08/09/2017   CHOLHDL 2.2 08/09/2017    Hepatic Function Latest Ref Rng & Units 08/18/2016  Albumin - 4.2  AST 13 - 35 U/L 23  ALT 7 - 35 U/L 27  Alk Phosphatase 25 - 125 U/L 59       DM -Pt takes one Metformin per day as well as 1 saxenda per day -States her blood sugars are usually between 80-130 -She states she and her husband have been trying to eat healthier   -  She has been working on diet and exercise for diabetes  Pt is currently maintained on the following medications for diabetes:   see med list today Medication compliance - has been taking medications as prescribed    Denies polyuria/polydipsia. Denies hypo/ hyperglycemia symptoms - She denies new onset of: chest pain, exercise intolerance, shortness of breath, dizziness, visual changes, headache, lower extremity swelling or claudication.   Last diabetic eye  exam was  Lab Results  Component Value Date   HMDIABEYEEXA No Retinopathy 09/25/2017    Foot exam- UTD  Last A1C in the office was:  Lab Results  Component Value Date   HGBA1C 6.3 (A) 08/01/2018   HGBA1C 5.6 02/22/2018   HGBA1C 6.2 (H) 08/09/2017    Lab Results  Component Value Date   MICROALBUR 80 08/29/2017   [place] 21 08/09/2017   CREATININE 0.6 08/18/2016    Gynecology -Pt denies care of gynecologist at this time -States she "thought you only needed that if you were having sex or something"  Last 3 blood pressure readings in our office are as follows: BP Readings from Last 3 Encounters:  08/01/18 120/85  07/25/18 110/78  06/06/18 123/76    BMI Readings from Last 3 Encounters:  08/01/18 36.88 kg/m  07/25/18 37.26 kg/m  06/06/18 37.25 kg/m     No problems updated.    Patient Care Team    Relationship Specialty Notifications  Start End  [name], DO PCP - General Family Medicine  08/18/16   Marice Potter, MD Consulting Physician Oncology  08/29/17   Marin Comment, My [place], [place] Referring Physician Optometry  10/10/17      Patient Active Problem List   Diagnosis Date Noted  . Hyperlipidemia associated with type 2 diabetes mellitus ([place]) 08/29/2017    Priority: High  . Obesity, Class III, BMI 40-49.9 (morbid obesity) ([place]) 08/20/2016    Priority: High  . Type II Diabetes mellitus without complication ([place]) 17/79/3903    Priority: High  . Hypertension associated with diabetes ([place]) 08/18/2016    Priority: High  . Thyroid disease 08/18/2016    Priority: High  . Diabetic peripheral neuropathy ([place]) 04/21/2015    Priority: High  . Hypertriglyceridemia 10/18/2016    Priority: Medium  . Insomnia 10/18/2016    Priority: Medium  . 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 Adjustment disorder with mixed anxiety and depressed mood 08/20/2016    Priority: Medium  . Chronic lymphoblastic leukemia ([place]) 08/18/2016    Priority: Low  . Family history of breast cancer     Priority: Low  . Family history of colon cancer- brother age 59     Priority: Low  . Myalgias and generalized arthralgias 06/20/2018  . High risk for vaginitis due to Candida secondary to antibiotic use 06/06/2018  . Left tennis elbow 10/10/2017  . Muscle dysfunction/ spasm- upper traps on L 10/10/2017  . Hypothyroidism 08/29/2017  . Family history of colon cancer requiring screening colonoscopy 08/20/2016  . Chronic back pain greater than 3 months duration 08/18/2016  . Genetic testing 05/21/2015  . Family history of polyps in the colon      Past Medical History:  Diagnosis Date  . Chronic lymphoblastic leukemia ([place])   . Diabetes mellitus without complication ([place])   . Family history of breast cancer   . Family history of colon cancer   . Family history of polyps in the colon   . Hypertension   . Thyroid  disease      Past Surgical History:  Procedure Laterality Date  . ABDOMINAL HYSTERECTOMY  2006   reports ovaries and cervix are intact  . TONSILLECTOMY       Family History  Problem Relation Age of Onset  . Breast cancer Mother        61s; deceased 8  . Colon polyps Mother        Dx 37s; #/type  unknown  . Cancer Father        lung cancer; smoker; deceased 68  . Breast cancer Sister 41       triple negative; currently 26  . Cancer Maternal Aunt        lung cancer; smoker; currently 86  . Colon cancer Maternal Uncle        Dx 49s; currently 66  . Breast cancer Paternal Aunt        Dx 65s; currently 49s  . Cancer Paternal Uncle        liver; heavy drinker  . Cancer Maternal Grandfather        unk. primary; deceased 74s  . Cancer Paternal Grandmother        Dx 52s; unknown primary  . Breast cancer Sister 12       currently 65  . Uterine cancer Sister 24       currently 63  . Cancer Paternal Uncle        lung; heavy smoker  . Breast cancer Cousin        mat first cousin through aunt; poss. breast ca; had mastectomy; currently 50  . Ovarian cancer Other        mat grandmother's sister  . Cancer Other        stomach cancer; mat grandmother's sister  . Colon polyps Brother        age, #, type unknown     Social History   Substance and Sexual Activity  Drug Use No  ,  Social History   Substance and Sexual Activity  Alcohol Use No  ,  Social History   Tobacco Use  Smoking Status Never Smoker  Smokeless Tobacco Never Used  ,    Current Outpatient Medications on File Prior to Visit  Medication Sig Dispense Refill  . amoxicillin-clavulanate (AUGMENTIN) 875-125 MG tablet Take 1 tablet by mouth 2 (two) times daily for 10 days. 20 tablet 0  . aspirin 81 MG chewable tablet Chew 81 mg by mouth daily.    Marland Kitchen atorvastatin (LIPITOR) 20 MG tablet TAKE 1 TABLET AT BEDTIME 90 tablet 1  . calcium carbonate (CALCIUM 600) 600 MG TABS tablet Take 1 tablet by mouth daily.     . cetirizine (ZYRTEC) 5 MG tablet Take 1 tablet by mouth daily.    . Cholecalciferol (VITAMIN D3) 5000 units TABS 5,000 IU OTC vitamin D3 daily. 90 tablet 121  . escitalopram (LEXAPRO) 20 MG tablet TAKE 1 TABLET DAILY (NEED OFFICE VISIT FOR FURTHER REFILLS) 30 tablet 0  . glucose blood (FREESTYLE LITE) test strip CHECK FASTING BLOOD SUGAR AND 2 HOURS AFTER A MEAL 200 each 3  . LEVOXYL 112 MCG tablet TAKE 1 TABLET DAILY 90 tablet 1  . lisinopril-hydrochlorothiazide (PRINZIDE,ZESTORETIC) 20-25 MG tablet Take 1 tablet daily by mouth. 90 tablet 3  . metFORMIN (GLUCOPHAGE-XR) 500 MG 24 hr tablet TAKE 2 TABLETS AT BEDTIME (Patient taking differently: Take 500 mg by mouth at bedtime. ) 180 tablet 0  . Multiple Vitamins-Minerals (ONE-A-DAY WOMENS 50 PLUS PO) Take 1 tablet daily by mouth.    . Omega-3 Fatty Acids (FISH OIL) 1200 MG CAPS Take 4-5 tabs daily    . predniSONE (DELTASONE) 20 MG tablet (start 8/22) Take 3 pills a day for 2 days, 2 pills a day for 2 days, 1 pill a day for 2 days then one half pill a day for 2 days then off 14 tablet 0  . SAXENDA 18 MG/3ML SOPN TAKE AS  INSTRUCTED BY YOUR PRESCRIBER 45 mL 0  . Vitamin D, Ergocalciferol, (DRISDOL) 50000 units CAPS capsule TAKE 1 CAPSULE EVERY 7 DAYS 12 capsule 20  . chlorpheniramine-HYDROcodone (TUSSIONEX) 10-8 MG/5ML SUER Take 5 mLs by mouth every 12 (twelve) hours as needed for cough (cough, will cause drowsiness.). 200 mL 0   No current facility-administered medications on file prior to visit.      No Known Allergies   Review of Systems:   General:  Denies fever, chills Optho/Auditory:   Denies visual changes, blurred vision Respiratory:   Denies SOB, cough, wheeze, DIB  Cardiovascular:   Denies chest pain, palpitations, painful respirations Gastrointestinal:   Denies nausea, vomiting, diarrhea.  Endocrine:     Denies new hot or cold intolerance Musculoskeletal:  Denies joint swelling, gait issues, or new unexplained myalgias/  arthralgias Skin:  Denies rash, suspicious lesions  Neurological:    Denies dizziness, unexplained weakness, numbness  Psychiatric/Behavioral:   Denies mood changes    Objective:     Blood pressure 120/85, pulse 73, height '5\' 10"'  (1.778 m), weight 257 lb (116.6 kg), SpO2 98 %.  Body mass index is 36.88 kg/m.  General: Well Developed, well nourished, and in no acute distress.  HEENT: Normocephalic, atraumatic, pupils equal round reactive to light, neck supple, No carotid bruits, no JVD Skin: Warm and dry, cap RF less 2 sec Cardiac: Regular rate and rhythm, S1, S2 WNL's, no murmurs rubs or gallops Respiratory: ECTA B/L, Not using accessory muscles, speaking in full sentences. NeuroM-Sk: Ambulates w/o assistance, moves ext * 4 w/o difficulty, sensation grossly intact.  Ext: scant edema b/l lower ext Psych: No HI/SI, judgement and insight good, Euthymic mood. Full Affect.

## 2018-08-01 NOTE — Patient Instructions (Signed)
As we discussed please come in in the near future for complete physical around mid-September or so.  Please come fasting so we can obtain blood work if needed.  Otherwise we will see you in 4 months for chronic follow-up of your diabetes, hypertension, diabetic peripheral neuropathy, cholesterol, mood and wt mgt .    Diabetes Mellitus and Standards of Medical Care  Managing diabetes (diabetes mellitus) can be complicated. Your diabetes treatment may be managed by a team of health care providers, including:  A diet and nutrition specialist (registered dietitian).  A nurse.  A certified diabetes educator (CDE).  A diabetes specialist (endocrinologist).  An eye doctor.  A primary care provider.  A dentist.  Your health care providers follow a schedule in order to help you get the best quality of care. The following schedule is a general guideline for your diabetes management plan. Your health care providers may also give you more specific instructions.  HbA1c (hemoglobin A1c) test This test provides information about blood sugar (glucose) control over the previous 2-3 months. It is used to check whether your diabetes management plan needs to be adjusted.  If you are meeting your treatment goals, this test is done at least 2 times a year.  If you are not meeting treatment goals or if your treatment goals have changed, this test is done 4 times a year.  Blood pressure test  This test is done at every routine medical visit. For most people, the goal is less than 130/80. Ask your health care provider what your goal blood pressure should be.  Dental and eye exams  Visit your dentist two times a year.  If you have type 1 diabetes, get an eye exam 3-5 years after you are diagnosed, and then once a year after your first exam. ? If you were diagnosed with type 1 diabetes as a child, get an eye exam when you are age 33 or older and have had diabetes for 3-5 years. After the first exam,  you should get an eye exam once a year.  If you have type 2 diabetes, have an eye exam as soon as you are diagnosed, and then once a year after your first exam.  Foot care exam  Visual foot exams are done at every routine medical visit. The exams check for cuts, bruises, redness, blisters, sores, or other problems with the feet.  A complete foot exam is done by your health care provider once a year. This exam includes an inspection of the structure and skin of your feet, and a check of the pulses and sensation in your feet. ? Type 1 diabetes: Get your first exam 3-5 years after diagnosis. ? Type 2 diabetes: Get your first exam as soon as you are diagnosed.  Check your feet every day for cuts, bruises, redness, blisters, or sores. If you have any of these or other problems that are not healing, contact your health care provider.  Kidney function test (urine microalbumin)  This test is done once a year. ? Type 1 diabetes: Get your first test 5 years after diagnosis. ? Type 2 diabetes: Get your first test as soon as you are diagnosed._  If you have chronic kidney disease (CKD), get a serum creatinine and estimated glomerular filtration rate (eGFR) test once a year.  Lipid profile (cholesterol, HDL, LDL, triglycerides)  This test should be done when you are diagnosed with diabetes, and every 5 years after the first test. If you are on medicines  to lower your cholesterol, you may need to get this test done every year. ? The goal for LDL is less than 100 mg/dL (5.5 mmol/L). If you are at high risk, the goal is less than 70 mg/dL (3.9 mmol/L). ? The goal for HDL is 40 mg/dL (2.2 mmol/L) for men and 50 mg/dL(2.8 mmol/L) for women. An HDL cholesterol of 60 mg/dL (3.3 mmol/L) or higher gives some protection against heart disease. ? The goal for triglycerides is less than 150 mg/dL (8.3 mmol/L).  Immunizations  The yearly flu (influenza) vaccine is recommended for everyone 6 months or older who  has diabetes.  The pneumonia (pneumococcal) vaccine is recommended for everyone 2 years or older who has diabetes. If you are 11 or older, you may get the pneumonia vaccine as a series of two separate shots.  The hepatitis B vaccine is recommended for adults shortly after they have been diagnosed with diabetes.  The Tdap (tetanus, diphtheria, and pertussis) vaccine should be given: ? According to normal childhood vaccination schedules, for children. ? Every 10 years, for adults who have diabetes.  The shingles vaccine is recommended for people who have had chicken pox and are 50 years or older.  Mental and emotional health  Screening for symptoms of eating disorders, anxiety, and depression is recommended at the time of diagnosis and afterward as needed. If your screening shows that you have symptoms (you have a positive screening result), you may need further evaluation and be referred to a mental health care provider.  Diabetes self-management education  Education about how to manage your diabetes is recommended at diagnosis and ongoing as needed.  Treatment plan  Your treatment plan will be reviewed at every medical visit.  Summary  Managing diabetes (diabetes mellitus) can be complicated. Your diabetes treatment may be managed by a team of health care providers.  Your health care providers follow a schedule in order to help you get the best quality of care.  Standards of care including having regular physical exams, blood tests, blood pressure monitoring, immunizations, screening tests, and education about how to manage your diabetes.  Your health care providers may also give you more specific instructions based on your individual health.      Type 2 Diabetes Mellitus, Self Care, Adult Caring for yourself after you have been diagnosed with type 2 diabetes (type 2 diabetes mellitus) means keeping your blood sugar (glucose) under control with a balance  of:  Nutrition.  Exercise.  Lifestyle changes.  Medicines or insulin, if necessary.  Support from your team of health care providers and others.  The following information explains what you need to know to manage your diabetes at home. What do I need to do to manage my blood glucose?  Check your blood glucose every day, as often as told by your health care provider.  Contact your health care provider if your blood glucose is above your target for 2 tests in a row.  Have your A1c (hemoglobin A1c) level checked at least two times a year, or as often as told by your health care provider. Your health care provider will set individualized treatment goals for you. Generally, the goal of treatment is to maintain the following blood glucose levels:  Before meals (preprandial): 80-130 mg/dL (4.4-7.2 mmol/L).  After meals (postprandial): below 180 mg/dL (10 mmol/L).  A1c level: less than 7%.  What do I need to know about hyperglycemia and hypoglycemia? What is hyperglycemia? Hyperglycemia, also called high blood glucose, occurs when  blood glucose is too high.Make sure you know the early signs of hyperglycemia, such as:  Increased thirst.  Hunger.  Feeling very tired.  Needing to urinate more often than usual.  Blurry vision.  What is hypoglycemia? Hypoglycemia, also called low blood glucose, occurswith a blood glucose level at or below 70 mg/dL (3.9 mmol/L). The risk for hypoglycemia increases during or after exercise, during sleep, during illness, and when skipping meals or not eating for a long time (fasting). It is important to know the symptoms of hypoglycemia and treat it right away. Always have a 15-gram rapid-acting carbohydrate snack with you to treat low blood glucose. Family members and close friends should also know the symptoms and should understand how to treat hypoglycemia, in case you are not able to treat yourself. What are the symptoms of  hypoglycemia? Hypoglycemia symptoms can include:  Hunger.  Anxiety.  Sweating and feeling clammy.  Confusion.  Dizziness or feeling light-headed.  Sleepiness.  Nausea.  Increased heart rate.  Headache.  Blurry vision.  Seizure.  Nightmares.  Tingling or numbness around the mouth, lips, or tongue.  A change in speech.  Decreased ability to concentrate.  A change in coordination.  Restless sleep.  Tremors or shakes.  Fainting.  Irritability.  How do I treat hypoglycemia?  If you are alert and able to swallow safely, follow the 15:15 rule:  Take 15 grams of a rapid-acting carbohydrate. Rapid-acting options include: ? 1 tube of glucose gel. ? 3 glucose pills. ? 6-8 pieces of hard candy. ? 4 oz (120 mL) of fruit juice. ? 4 oz (120 mL) of regular (not diet) soda.  Check your blood glucose 15 minutes after you take the carbohydrate.  If the repeat blood glucose level is still at or below 70 mg/dL (3.9 mmol/L), take 15 grams of a carbohydrate again.  If your blood glucose level does not increase above 70 mg/dL (3.9 mmol/L) after 3 tries, seek emergency medical care.  After your blood glucose level returns to normal, eat a meal or a snack within 1 hour.  How do I treat severe hypoglycemia? Severe hypoglycemia is when your blood glucose level is at or below 54 mg/dL (3 mmol/L). Severe hypoglycemia is an emergency. Do not wait to see if the symptoms will go away. Get medical help right away. Call your local emergency services (911 in the [place] Do not drive yourself to the hospital. If you have severe hypoglycemia and you cannot eat or drink, you may need an injection of glucagon. A family member or close friend should learn how to check your blood glucose and how to give you a glucagon injection. Ask your health care provider if you need to have an emergency glucagon injection kit available. Severe hypoglycemia may need to be treated in a hospital. The treatment  may include getting glucose through an IV tube. You may also need treatment for the cause of your hypoglycemia. Can having diabetes put me at risk for other conditions? Having diabetes can put you at risk for other long-term (chronic) conditions, such as heart disease and kidney disease. Your health care provider may prescribe medicines to help prevent complications from diabetes. These medicines may include:  Aspirin.  Medicine to lower cholesterol.  Medicine to control blood pressure.  What else can I do to manage my diabetes? Take your diabetes medicines as told  If your health care provider prescribed insulin or diabetes medicines, take them every day.  Do not run out of insulin  or other diabetes medicines that you take. Plan ahead so you always have these available.  If you use insulin, adjust your dosage based on how physically active you are and what foods you eat. Your health care provider will tell you how to adjust your dosage. Make healthy food choices  The things that you eat and drink affect your blood glucose and your insulin dosage. Making good choices helps to control your diabetes and prevent other health problems. A healthy meal plan includes eating lean proteins, complex carbohydrates, fresh fruits and vegetables, low-fat dairy products, and healthy fats. Make an appointment to see a diet and nutrition specialist (registered dietitian) to help you create an eating plan that is right for you. Make sure that you:  Follow instructions from your health care provider about eating or drinking restrictions.  Drink enough fluid to keep your urine clear or pale yellow.  Eat healthy snacks between nutritious meals.  Track the carbohydrates that you eat. Do this by reading food labels and learning the standard serving sizes of foods.  Follow your sick day plan whenever you cannot eat or drink as usual. Make this plan in advance with your health care provider.  Stay  active  Exercise regularly, as told by your health care provider. This may include:  Stretching and doing strength exercises, such as yoga or weightlifting, at least 2 times a week.  Doing at least 150 minutes of moderate-intensity or vigorous-intensity exercise each week. This could be brisk walking, biking, or water aerobics. ? Spread out your activity over at least 3 days of the week. ? Do not go more than 2 days in a row without doing some kind of physical activity.  When you start a new exercise or activity, work with your health care provider to adjust your insulin, medicines, or food intake as needed. Make healthy lifestyle choices  Do not use any tobacco products, such as cigarettes, chewing tobacco, and e-cigarettes. If you need help quitting, ask your health care provider.  If your health care provider says that alcohol is safe for you, limit alcohol intake to no more than 1 drink per day for nonpregnant women and 2 drinks per day for men. One drink equals 12 oz of beer, 5 oz of wine, or 1 oz of hard liquor.  Learn to manage stress. If you need help with this, ask your health care provider. Care for your body   Keep your immunizations up to date. In addition to getting vaccinations as told by your health care provider, it is recommended that you get vaccinated against the following illnesses: ? The flu (influenza). Get a flu shot every year. ? Pneumonia. ? Hepatitis B.  Schedule an eye exam soon after your diagnosis, and then one time every year after that.  Check your skin and feet every day for cuts, bruises, redness, blisters, or sores. Schedule a foot exam with your health care provider once every year.  Brush your teeth and gums two times a day, and floss at least one time a day. Visit your dentist at least once every 6 months.  Maintain a healthy weight. General instructions  Take over-the-counter and prescription medicines only as told by your health care  provider.  Share your diabetes management plan with people in your workplace, school, and household.  Check your urine for ketones when you are ill and as told by your health care provider.  Ask your health care provider: ? Do I need to meet with  a diabetes educator? ? Where can I find a support group for people with diabetes?  Carry a medical alert card or wear medical alert jewelry.  Keep all follow-up visits as told by your health care provider. This is important. Where to find more information: For more information about diabetes, visit:  American Diabetes Association (ADA): www.diabetes.org  American Association of Diabetes Educators (AADE): www.diabeteseducator.org/patient-resources  This information is not intended to replace advice given to you by your health care provider. Make sure you discuss any questions you have with your health care provider. Document Released: 03/14/2016 Document Revised: 04/28/2016 Document Reviewed: 12/25/2015 Elsevier Interactive Patient Education  2017 [employer].      Blood Glucose Monitoring, Adult Monitoring your blood sugar (glucose) helps you manage your diabetes. It also helps you and your health care provider determine how well your diabetes management plan is working. Blood glucose monitoring involves checking your blood glucose as often as directed, and keeping a record (log) of your results over time. Why should I monitor my blood glucose? Checking your blood glucose regularly can:  Help you understand how food, exercise, illnesses, and medicines affect your blood glucose.  Let you know what your blood glucose is at any time. You can quickly tell if you are having low blood glucose (hypoglycemia) or high blood glucose (hyperglycemia).  Help you and your health care provider adjust your medicines as needed.  When should I check my blood glucose? Follow instructions from your health care provider about how often to check your  blood glucose.   This may depend on:  The type of diabetes you have.  How well-controlled your diabetes is.  Medicines you are taking.  If you have type 1 diabetes:  Check your blood glucose at least 2 times a day.  Also check your blood glucose: ? Before every insulin injection. ? Before and after exercise. ? Between meals. ? 2 hours after a meal. ? Occasionally between 2:00 a.m. and 3:00 a.m., as directed. ? Before potentially dangerous tasks, like driving or using heavy machinery. ? At bedtime.  You may need to check your blood glucose more often, up to 6-10 times a day: ? If you use an insulin pump. ? If you need multiple daily injections (MDI). ? If your diabetes is not well-controlled. ? If you are ill. ? If you have a history of severe hypoglycemia. ? If you have a history of not knowing when your blood glucose is getting low (hypoglycemia unawareness).  If you have type 2 diabetes:  If you take insulin or other diabetes medicines, check your blood glucose at least 2 times a day.  If you are on intensive insulin therapy, check your blood glucose at least 4 times a day. Occasionally, you may also need to check between 2:00 a.m. and 3:00 a.m., as directed.  Also check your blood glucose: ? Before and after exercise. ? Before potentially dangerous tasks, like driving or using heavy machinery.  You may need to check your blood glucose more often if: ? Your medicine is being adjusted. ? Your diabetes is not well-controlled. ? You are ill.  What is a blood glucose log?  A blood glucose log is a record of your blood glucose readings. It helps you and your health care provider: ? Look for patterns in your blood glucose over time. ? Adjust your diabetes management plan as needed.  Every time you check your blood glucose, write down your result and notes about things that may  be affecting your blood glucose, such as your diet and exercise for the day.  Most glucose  meters store a record of glucose readings in the meter. Some meters allow you to download your records to a computer. How do I check my blood glucose? Follow these steps to get accurate readings of your blood glucose: Supplies needed   Blood glucose meter.  Test strips for your meter. Each meter has its own strips. You must use the strips that come with your meter.  A needle to prick your finger (lancet). Do not use lancets more than once.  A device that holds the lancet (lancing device).  A journal or log book to write down your results.  Procedure  Wash your hands with soap and water.  Prick the side of your finger (not the tip) with the lancet. Use a different finger each time.  Gently rub the finger until a small drop of blood appears.  Follow instructions that come with your meter for inserting the test strip, applying blood to the strip, and using your blood glucose meter.  Write down your result and any notes.  Alternative testing sites  Some meters allow you to use areas of your body other than your finger (alternative sites) to test your blood.  If you think you may have hypoglycemia, or if you have hypoglycemia unawareness, do not use alternative sites. Use your finger instead.  Alternative sites may not be as accurate as the fingers, because blood flow is slower in these areas. This means that the result you get may be delayed, and it may be different from the result that you would get from your finger.  The most common alternative sites are: ? Forearm. ? Thigh. ? Palm of the hand.  Additional tips  Always keep your supplies with you.  If you have questions or need help, all blood glucose meters have a 24-hour "hotline" number that you can call. You may also contact your health care provider.  After you use a few boxes of test strips, adjust (calibrate) your blood glucose meter by following instructions that came with your meter.    The American Diabetes  Association suggests the following targets for most nonpregnant adults with diabetes.  More or less stringent glycemic goals may be appropriate for each individual.  A1C: Less than 7% A1C may also be reported as eAG: Less than 154 mg/dl Before a meal (preprandial plasma glucose): 80-130 mg/dl 1-2 hours after beginning of the meal (Postprandial plasma glucose)*: Less than 180 mg/dl  *Postprandial glucose may be targeted if A1C goals are not met despite reaching preprandial glucose goals.   GOALS in short:  The goals are for the Hgb A1C to be less than 7.0 & blood pressure to be less than 130/80.    It is recommended that all diabetics are educated on and follow a healthy diabetic diet, exercise for 30 minutes 3-4 times per week (walking, biking, swimming, or machine), monitor blood glucose readings and bring that record with you to be reviewed at your next office visit.     You should be checking fasting blood sugars- especially after you eat poorly or eat really healthy, and also check 2 hour postprandial blood sugars after largest meal of the day.    Write these down and bring in your log at each office visit.    You will need to be seen every 3 months by the provider managing your Diabetes unless told otherwise by that provider.  You will need yearly eye exams from an eye specialist and foot exams to check the nerves of your feet.  Also, your urine should be checked yearly as well to make sure excess protein is not present.   If you are checking your blood pressure at home, please record it and bring it to your next office visit.    Follow the Dietary Approaches to Stop Hypertension (DASH) diet (3 servings of fruit and vegetables daily, whole grains, low sodium, low-fat proteins).  See below.    Lastly, when it comes to your cholesterol, the goal is to have the HDL (good cholesterol) >40, and the LDL (bad cholesterol) <100.   It is recommended that you follow a heart healthy, low saturated  and trans-fat diet and exercise for 30 minutes at least 5 times a week.     (( Check out the DASH diet = 1.5 Gram Low Sodium Diet   A 1.5 gram sodium diet restricts the amount of sodium in the diet to no more than 1.5 g or 1500 mg daily.  The American Heart Association recommends Americans over the age of 34 to consume no more than 1500 mg of sodium each day to reduce the risk of developing high blood pressure.  Research also shows that limiting sodium may reduce heart attack and stroke risk.  Many foods contain sodium for flavor and sometimes as a preservative.  When the amount of sodium in a diet needs to be low, it is important to know what to look for when choosing foods and drinks.  The following includes some information and guidelines to help make it easier for you to adapt to a low sodium diet.    QUICK TIPS  Do not add salt to food.  Avoid convenience items and fast food.  Choose unsalted snack foods.  Buy lower sodium products, often labeled as "lower sodium" or "no salt added."  Check food labels to learn how much sodium is in 1 serving.  When eating at a restaurant, ask that your food be prepared with less salt or none, if possible.    READING FOOD LABELS FOR SODIUM INFORMATION  The nutrition facts label is a good place to find how much sodium is in foods. Look for products with no more than 400 mg of sodium per serving.  Remember that 1.5 g = 1500 mg.  The food label may also list foods as:  Sodium-free: Less than 5 mg in a serving.  Very low sodium: 35 mg or less in a serving.  Low-sodium: 140 mg or less in a serving.  Light in sodium: 50% less sodium in a serving. For example, if a food that usually has 300 mg of sodium is changed to become light in sodium, it will have 150 mg of sodium.  Reduced sodium: 25% less sodium in a serving. For example, if a food that usually has 400 mg of sodium is changed to reduced sodium, it will have 300 mg of sodium.    CHOOSING FOODS   Grains  Avoid: Salted crackers and snack items. Some cereals, including instant hot cereals. Bread stuffing and biscuit mixes. Seasoned rice or pasta mixes.  Choose: Unsalted snack items. Low-sodium cereals, oats, puffed wheat and rice, shredded wheat. English muffins and bread. Pasta.  Meats  Avoid: Salted, canned, smoked, spiced, pickled meats, including fish and poultry. Bacon, ham, sausage, cold cuts, hot dogs, anchovies.  Choose: Low-sodium canned tuna and salmon. Fresh or frozen meat, poultry, and fish.  Dairy  Avoid: Processed cheese and spreads. Cottage cheese. Buttermilk and condensed milk. Regular cheese.  Choose: Milk. Low-sodium cottage cheese. Yogurt. Sour cream. Low-sodium cheese.  Fruits and Vegetables  Avoid: Regular canned vegetables. Regular canned tomato sauce and paste. Frozen vegetables in sauces. Olives. Angie Fava. Relishes. Sauerkraut.  Choose: Low-sodium canned vegetables. Low-sodium tomato sauce and paste. Frozen or fresh vegetables. Fresh and frozen fruit.  Condiments  Avoid: Canned and packaged gravies. Worcestershire sauce. Tartar sauce. Barbecue sauce. Soy sauce. Steak sauce. Ketchup. Onion, garlic, and table salt. Meat flavorings and tenderizers.  Choose: Fresh and dried herbs and spices. Low-sodium varieties of mustard and ketchup. Lemon juice. Tabasco sauce. Horseradish.    SAMPLE 1.5 GRAM SODIUM MEAL PLAN:   Breakfast / Sodium (mg)  1 cup low-fat milk / 143 mg  1 whole-wheat English muffin / 240 mg  1 tbs heart-healthy margarine / 153 mg  1 hard-boiled egg / 139 mg  1 small orange / 0 mg  Lunch / Sodium (mg)  1 cup raw carrots / 76 mg  2 tbs no salt added peanut butter / 5 mg  2 slices whole-wheat bread / 270 mg  1 tbs jelly / 6 mg   cup red grapes / 2 mg  Dinner / Sodium (mg)  1 cup whole-wheat pasta / 2 mg  1 cup low-sodium tomato sauce / 73 mg  3 oz lean ground beef / 57 mg  1 small side salad (1 cup raw spinach leaves,  cup cucumber,  cup  yellow bell pepper) with 1 tsp olive oil and 1 tsp red wine vinegar / 25 mg  Snack / Sodium (mg)  1 container low-fat vanilla yogurt / 107 mg  3 graham cracker squares / 127 mg  Nutrient Analysis  Calories: 1745  Protein: 75 g  Carbohydrate: 237 g  Fat: 57 g  Sodium: 1425 mg  Document Released: 11/21/2005 Document Revised: 08/03/2011 Document Reviewed: 02/22/2010  ExitCare Patient Information 2012 [place].))    This information is not intended to replace advice given to you by your health care provider. Make sure you discuss any questions you have with your health care provider. Document Released: 11/24/2003 Document Revised: 06/10/2016 Document Reviewed: 05/02/2016 Elsevier Interactive Patient Education  2017 [employer].

## 2018-08-22 ENCOUNTER — Other Ambulatory Visit: Payer: Self-pay | Admitting: Family Medicine

## 2018-08-22 DIAGNOSIS — E119 Type 2 diabetes mellitus without complications: Secondary | ICD-10-CM

## 2018-10-03 ENCOUNTER — Other Ambulatory Visit: Payer: Self-pay

## 2018-10-03 ENCOUNTER — Ambulatory Visit (INDEPENDENT_AMBULATORY_CARE_PROVIDER_SITE_OTHER): Admitting: Family Medicine

## 2018-10-03 ENCOUNTER — Other Ambulatory Visit (HOSPITAL_COMMUNITY)
Admission: RE | Admit: 2018-10-03 | Discharge: 2018-10-03 | Disposition: A | Discharge status: Home / Self Care | Source: Ambulatory Visit | Attending: Family Medicine | Admitting: Family Medicine

## 2018-10-03 ENCOUNTER — Encounter: Payer: Self-pay | Admitting: Family Medicine

## 2018-10-03 ENCOUNTER — Other Ambulatory Visit

## 2018-10-03 VITALS — BP 114/73 | HR 89 | Resp 16 | Ht 67.75 in | Wt 265.0 lb

## 2018-10-03 DIAGNOSIS — Z1239 Encounter for other screening for malignant neoplasm of breast: Secondary | ICD-10-CM

## 2018-10-03 DIAGNOSIS — E1169 Type 2 diabetes mellitus with other specified complication: Secondary | ICD-10-CM

## 2018-10-03 DIAGNOSIS — E785 Hyperlipidemia, unspecified: Secondary | ICD-10-CM

## 2018-10-03 DIAGNOSIS — Z01419 Encounter for gynecological examination (general) (routine) without abnormal findings: Secondary | ICD-10-CM | POA: Diagnosis not present

## 2018-10-03 DIAGNOSIS — Z0000 Encounter for general adult medical examination without abnormal findings: Secondary | ICD-10-CM

## 2018-10-03 DIAGNOSIS — E119 Type 2 diabetes mellitus without complications: Secondary | ICD-10-CM

## 2018-10-03 DIAGNOSIS — D72829 Elevated white blood cell count, unspecified: Secondary | ICD-10-CM

## 2018-10-03 DIAGNOSIS — I152 Hypertension secondary to endocrine disorders: Secondary | ICD-10-CM

## 2018-10-03 DIAGNOSIS — I10 Essential (primary) hypertension: Secondary | ICD-10-CM

## 2018-10-03 DIAGNOSIS — E079 Disorder of thyroid, unspecified: Secondary | ICD-10-CM

## 2018-10-03 DIAGNOSIS — E1159 Type 2 diabetes mellitus with other circulatory complications: Secondary | ICD-10-CM

## 2018-10-03 DIAGNOSIS — E039 Hypothyroidism, unspecified: Secondary | ICD-10-CM

## 2018-10-03 DIAGNOSIS — Z1211 Encounter for screening for malignant neoplasm of colon: Secondary | ICD-10-CM

## 2018-10-03 MED ORDER — LIRAGLUTIDE -WEIGHT MANAGEMENT 18 MG/3ML ~~LOC~~ SOPN: 3.0000 mg | PEN_INJECTOR | Freq: Every day | SUBCUTANEOUS | 0 refills | Status: DC

## 2018-10-03 NOTE — Progress Notes (Signed)
Impression and Recommendations:    1. Well woman exam   2. Screening for breast cancer   3. Screening for malignant neoplasm of colon   4. Obesity, Class III, BMI 40-49.9 (morbid obesity) ([place])   5. Type II Diabetes mellitus without complication (H[facility])     Eyes -Encouraged pt to have an eye exam every year  Dental -Encouraged pt to get dental care every six months  Breast -Reviewed methods for self breast exams and red flag symptoms -Discussed normal findings such as fibrous tissue or different sized breasts -instructed pt to contact us if she notices any abnormal changes  Colonoscopy -Instructed pt to get a colonoscopy this year -Explained the use of cologuard between years of colonoscopy  Dermatology -Explained that pt is more likely to have issues due to fair skin -Educated pt about ABCD of red flag symptoms for skin changes -Encouraged pt to regularly conduct skin checks and to contact us with any changes  Venous appearance -Referred pt to vascular associates -Explained that poor venous return can cause swelling in the lower extremities -Explained that vascular physicians can help treat varicose veins and improve venous return  Exercise -encouraged pt to continue exercising regularly and to work to increase her exercise to every day  Anticipatory Guidance: -Received FBW this morning  -Discussed importance of wearing a seatbelt while driving, not texting while driving; sunscreen when outside along with yearly skin surveillance; eating a well balanced and modest diet; physical activity at least 25 minutes per day or 150 min/ week of moderate to intense activity.  Immunizations / Screenings / Labs:  -Educated pt on shingrix vaccine and provided handouts -Instructed pt to contact insurance and find out best way to receive vaccination  All immunizations and screenings that patient agrees to, are up-to-date per recommendations or will be updated today.  Patient  understands the needs for q 47mo dental and yearly vision screens which pt will schedule independently. Obtain CBC, CMP, HgA1c, Lipid panel, TSH and vit D when fasting if not already done recently.   Weight:   Discussed goal of losing even 5-10% of current body weight which would improve overall feelings of well being and improve objective health data significantly.   Improve nutrient density of diet through increasing intake of fruits and vegetables and decreasing saturated/trans fats, white flour products and refined sugar products.   Gross side effects, risk and benefits, and alternatives of medications discussed with patient.  Patient is aware that all medications have potential side effects and we are unable to predict every side effect or drug-drug interaction that may occur.  Expresses verbal understanding and consents to current therapy plan and treatment regimen.  F-up preventative CPE in 1 year. F/up sooner for chronic care management as discussed and/or prn.  Please see orders placed and AVS handed out to patient at the end of our visit for further patient instructions/ counseling done pertaining to today's office visit.   This document serves as a record of services personally performed by [name], MD. It was created on her behalf by Georga Bora, a trained medical scribe. The creation of this record is based on the scribe's personal observations and the provider's statements to them.   I have reviewed the above medical documentation for accuracy and completeness and I concur.  [name], D.O.       Subjective:    Chief Complaint  Patient presents with  . Annual Exam   CC:   HPI:  Rachel Simmons is a 52 y.o. female who presents to [facility] today a yearly health maintenance exam.  Stress -PHQ score of 3 -Pt states her mood is good -Says she's been working roughly 60 hours a week -says she is job hunting in her free time to  relieve stress  Eyes -Pt saw Dr. Truman Hayward in May for an eye exam -Denies glaucoma or intra-ocular pressure  Dental -Had a dental appointment this morning  GI -Pt denies GI issues, problems with food, changes  Breast -Pt says she does a self breast exam every month   Genitourinary -Pt denies vaginal irritation  -States she has had a hysterectomy but still has ovaries and cervix  Venous -Pt is concerned about her venous appearance and swelling -States her swelling is painful at times and worse in one foot than the other -Says she cut herself shaving recently and she had a lot of bleeding  Exercise -Pt states she's been swimming for exercise and stress relief  Health Maintenance Summary Reviewed and updated, unless pt declines services.  Colonoscopy:     (Unnecessary secondary to < 44 or > 71 years old.) Tobacco History Reviewed:   Y  Alcohol:    No concerns, no excessive use Exercise Habits:   Not meeting AHA guidelines STD concerns:   none Drug Use:   None Birth control method:   n/a Menses regular:     n/a Lumps or breast concerns:      no Breast Cancer Family History:      No   Immunization History  Administered Date(s) Administered  . Influenza,inj,Quad PF,6+ Mos 08/26/2016, 10/10/2017  . Influenza,inj,quad, With Preservative 08/26/2016  . Influenza-Unspecified 09/04/2016  . Pneumococcal Polysaccharide-23 10/18/2016  . Tdap 08/26/2016    Health Maintenance  Topic Date Due  . INFLUENZA VACCINE  07/05/2018  . OPHTHALMOLOGY EXAM  09/25/2018  . COLONOSCOPY  11/01/2018 (Originally 09/25/2017)  . HIV Screening  05/25/2029 (Originally 09/25/1982)  . HEMOGLOBIN A1C  04/04/2019  . FOOT EXAM  08/02/2019  . MAMMOGRAM  10/11/2019  . PAP SMEAR  10/03/2021  . TETANUS/TDAP  08/26/2026  . PNEUMOCOCCAL POLYSACCHARIDE VACCINE AGE 12-64 HIGH RISK  Completed     Wt Readings from Last 3 Encounters:  10/03/18 265 lb (120.2 kg)  08/01/18 257 lb (116.6 kg)  07/25/18 256 lb  (116.1 kg)   BP Readings from Last 3 Encounters:  10/03/18 114/73  08/01/18 120/85  07/25/18 110/78   Pulse Readings from Last 3 Encounters:  10/03/18 89  08/01/18 73  07/25/18 75     Past Medical History:  Diagnosis Date  . Chronic lymphoblastic leukemia   . Diabetes mellitus without complication ([place])   . Family history of breast cancer   . Family history of colon cancer   . Family history of polyps in the colon   . Hypertension   . Thyroid disease       Past Surgical History:  Procedure Laterality Date  . ABDOMINAL HYSTERECTOMY  2006   reports ovaries and cervix are intact  . TONSILLECTOMY        Family History  Problem Relation Age of Onset  . Breast cancer Mother        56s; deceased 25  . Colon polyps Mother        Dx 60s; #/type unknown  . Cancer Father        lung cancer; smoker; deceased 43  . Breast cancer Sister 3  triple negative; currently 53  . Cancer Maternal Aunt        lung cancer; smoker; currently 78  . Colon cancer Maternal Uncle        Dx 53s; currently 52  . Breast cancer Paternal Aunt        Dx 59s; currently 72s  . Cancer Paternal Uncle        liver; heavy drinker  . Cancer Maternal Grandfather        unk. primary; deceased 24s  . Cancer Paternal Grandmother        Dx 48s; unknown primary  . Breast cancer Sister 49       currently 16  . Uterine cancer Sister 39       currently 54  . Cancer Paternal Uncle        lung; heavy smoker  . Breast cancer Cousin        mat first cousin through aunt; poss. breast ca; had mastectomy; currently 50  . Ovarian cancer Other        mat grandmother's sister  . Cancer Other        stomach cancer; mat grandmother's sister  . Colon polyps Brother        age, #, type unknown      Social History   Substance and Sexual Activity  Drug Use No  ,   Social History   Substance and Sexual Activity  Alcohol Use No  ,   Social History   Tobacco Use  Smoking Status Never Smoker    Smokeless Tobacco Never Used  ,   Social History   Substance and Sexual Activity  Sexual Activity Yes  . Birth control/protection: None    Current Outpatient Medications on File Prior to Visit  Medication Sig Dispense Refill  . aspirin 81 MG chewable tablet Chew 81 mg by mouth daily.    Marland Kitchen atorvastatin (LIPITOR) 20 MG tablet TAKE 1 TABLET AT BEDTIME 90 tablet 1  . calcium carbonate (CALCIUM 600) 600 MG TABS tablet Take 1 tablet by mouth daily.    . cetirizine (ZYRTEC) 5 MG tablet Take 1 tablet by mouth daily.    . Cholecalciferol (VITAMIN D3) 5000 units TABS 5,000 IU OTC vitamin D3 daily. 90 tablet 121  . glucose blood (FREESTYLE LITE) test strip CHECK FASTING BLOOD SUGAR AND 2 HOURS AFTER A MEAL 200 each 3  . LEVOXYL 112 MCG tablet TAKE 1 TABLET DAILY 90 tablet 1  . metFORMIN (GLUCOPHAGE-XR) 500 MG 24 hr tablet Take 1 tablet (500 mg total) by mouth at bedtime. 90 tablet 1  . Multiple Vitamins-Minerals (ONE-A-DAY WOMENS 50 PLUS PO) Take 1 tablet daily by mouth.    . Omega-3 Fatty Acids (FISH OIL) 1200 MG CAPS Take 4-5 tabs daily    . Vitamin D, Ergocalciferol, (DRISDOL) 50000 units CAPS capsule TAKE 1 CAPSULE EVERY 7 DAYS 12 capsule 20  . escitalopram (LEXAPRO) 20 MG tablet TAKE 1 TABLET DAILY (NEED OFFICE VISIT FOR FURTHER REFILLS) (Patient not taking: Reported on 10/03/2018) 30 tablet 0   No current facility-administered medications on file prior to visit.     Allergies: Patient has no known allergies.  Review of Systems: General:   Denies fever, chills, unexplained weight loss.  Optho/Auditory:   Denies visual changes, blurred vision/LOV Respiratory:   Denies SOB, DOE more than baseline levels.  Cardiovascular:   Denies chest pain, palpitations, new onset peripheral edema  Gastrointestinal:   Denies nausea, vomiting, diarrhea.  Genitourinary: Denies dysuria, freq/  urgency, flank pain or discharge from genitals.  Endocrine:     Denies hot or cold intolerance, polyuria,  polydipsia. Musculoskeletal:   Denies unexplained myalgias, joint swelling, unexplained arthralgias, gait problems.  Skin:  Denies rash, suspicious lesions Neurological:     Denies dizziness, unexplained weakness, numbness  Psychiatric/Behavioral:   Denies mood changes, suicidal or homicidal ideations, hallucinations    Objective:    Blood pressure 114/73, pulse 89, resp. rate 16, height 5' 7.75" (1.721 m), weight 265 lb (120.2 kg), SpO2 99 %. Body mass index is 40.59 kg/m. General Appearance:    Alert, cooperative, no distress, appears stated age  Head:    Normocephalic, without obvious abnormality, atraumatic  Eyes:    PERRL, conjunctiva/corneas clear, EOM's intact, fundi    benign, both eyes  Ears:    Normal TM's and external ear canals, both ears  Nose:   Nares normal, septum midline, mucosa normal, no drainage    or sinus tenderness  Throat:   Lips w/o lesion, mucosa moist, and tongue normal; teeth and   gums normal  Neck:   Supple, symmetrical, trachea midline, no adenopathy;    thyroid:  no enlargement/tenderness/nodules; no carotid   bruit or JVD  Back:     Symmetric, no curvature, ROM normal, no CVA tenderness  Lungs:     Clear to auscultation bilaterally, respirations unlabored, no       Wh/ R/ R  Chest Wall:    No tenderness or gross deformity; normal excursion   Heart:    Regular rate and rhythm, S1 and S2 normal, no murmur, rub   or gallop  Breast Exam:    No tenderness, masses, or nipple abnormality b/l; no d/c  Abdomen:     Soft, non-tender, bowel sounds active all four quadrants, NO   G/R/R, no masses, no organomegaly  Genitalia:    Ext genitalia: without lesion, no rash or discharge, No         tenderness;  Cervix: WNL's w/o discharge or lesion;        Adnexa:  No tenderness or palpable masses   -Cervix is retroflexed  Rectal:    Normal tone, no masses or tenderness;   guaiac negative stool  Extremities:   Extremities normal, atraumatic, no cyanosis or gross edema   Pulses:   2+ and symmetric all extremities  Skin:   Warm, dry, Skin color, texture, turgor normal, no obvious rashes or lesions Psych: No HI/SI, judgement and insight good, Euthymic mood. Full Affect.  Neurologic:   CNII-XII intact, normal strength, sensation and reflexes    Throughout

## 2018-10-03 NOTE — Patient Instructions (Signed)
Preventive Care for Adults, Female  A healthy lifestyle and preventive care can promote health and wellness. Preventive health guidelines for women include the following key practices.   A routine yearly physical is a good way to check with your health care provider about your health and preventive screening. It is a chance to share any concerns and updates on your health and to receive a thorough exam.   Visit your dentist for a routine exam and preventive care every 6 months. Brush your teeth twice a day and floss once a day. Good oral hygiene prevents tooth decay and gum disease.   The frequency of eye exams is based on your age, health, family medical history, use of contact lenses, and other factors. Follow your health care provider's recommendations for frequency of eye exams.   Eat a healthy diet. Foods like vegetables, fruits, whole grains, low-fat dairy products, and lean protein foods contain the nutrients you need without too many calories. Decrease your intake of foods high in solid fats, added sugars, and salt. Eat the right amount of calories for you.Get information about a proper diet from your health care provider, if necessary.   Regular physical exercise is one of the most important things you can do for your health. Most adults should get at least 150 minutes of moderate-intensity exercise (any activity that increases your heart rate and causes you to sweat) each week. In addition, most adults need muscle-strengthening exercises on 2 or more days a week.   Maintain a healthy weight. The body mass index (BMI) is a screening tool to identify possible weight problems. It provides an estimate of body fat based on height and weight. Your health care provider can find your BMI, and can help you achieve or maintain a healthy weight.For adults 20 years and older:   - A BMI below 18.5 is considered underweight.   - A BMI of 18.5 to 24.9 is normal.   - A BMI of 25 to 29.9 is  considered overweight.   - A BMI of 30 and above is considered obese.   Maintain normal blood lipids and cholesterol levels by exercising and minimizing your intake of trans and saturated fats.  Eat a balanced diet with plenty of fruit and vegetables. Blood tests for lipids and cholesterol should begin at age 20 and be repeated every 5 years minimum.  If your lipid or cholesterol levels are high, you are over 40, or you are at high risk for heart disease, you may need your cholesterol levels checked more frequently.Ongoing high lipid and cholesterol levels should be treated with medicines if diet and exercise are not working.   If you smoke, find out from your health care provider how to quit. If you do not use tobacco, do not start.   Lung cancer screening is recommended for adults aged 55-80 years who are at high risk for developing lung cancer because of a history of smoking. A yearly low-dose CT scan of the lungs is recommended for people who have at least a 30-pack-year history of smoking and are a current smoker or have quit within the past 15 years. A pack year of smoking is smoking an average of 1 pack of cigarettes a day for 1 year (for example: 1 pack a day for 30 years or 2 packs a day for 15 years). Yearly screening should continue until the smoker has stopped smoking for at least 15 years. Yearly screening should be stopped for people who develop a   health problem that would prevent them from having lung cancer treatment.   If you are pregnant, do not drink alcohol. If you are breastfeeding, be very cautious about drinking alcohol. If you are not pregnant and choose to drink alcohol, do not have more than 1 drink per day. One drink is considered to be 12 ounces (355 mL) of beer, 5 ounces (148 mL) of wine, or 1.5 ounces (44 mL) of liquor.   Avoid use of street drugs. Do not share needles with anyone. Ask for help if you need support or instructions about stopping the use of  drugs.   High blood pressure causes heart disease and increases the risk of stroke. Your blood pressure should be checked at least yearly.  Ongoing high blood pressure should be treated with medicines if weight loss and exercise do not work.   If you are 69-55 years old, ask your health care provider if you should take aspirin to prevent strokes.   Diabetes screening involves taking a blood sample to check your fasting blood sugar level. This should be done once every 3 years, after age 38, if you are within normal weight and without risk factors for diabetes. Testing should be considered at a younger age or be carried out more frequently if you are overweight and have at least 1 risk factor for diabetes.   Breast cancer screening is essential preventive care for women. You should practice "breast self-awareness."  This means understanding the normal appearance and feel of your breasts and may include breast self-examination.  Any changes detected, no matter how small, should be reported to a health care provider.  Women in their 80s and 30s should have a clinical breast exam (CBE) by a health care provider as part of a regular health exam every 1 to 3 years.  After age 66, women should have a CBE every year.  Starting at age 1, women should consider having a mammogram (breast X-ray test) every year.  Women who have a family history of breast cancer should talk to their health care provider about genetic screening.  Women at a high risk of breast cancer should talk to their health care providers about having an MRI and a mammogram every year.   -Breast cancer gene (BRCA)-related cancer risk assessment is recommended for women who have family members with BRCA-related cancers. BRCA-related cancers include breast, ovarian, tubal, and peritoneal cancers. Having family members with these cancers may be associated with an increased risk for harmful changes (mutations) in the breast cancer genes BRCA1 and  BRCA2. Results of the assessment will determine the need for genetic counseling and BRCA1 and BRCA2 testing.   The Pap test is a screening test for cervical cancer. A Pap test can show cell changes on the cervix that might become cervical cancer if left untreated. A Pap test is a procedure in which cells are obtained and examined from the lower end of the uterus (cervix).   - Women should have a Pap test starting at age 57.   - Between ages 90 and 70, Pap tests should be repeated every 2 years.   - Beginning at age 63, you should have a Pap test every 3 years as long as the past 3 Pap tests have been normal.   - Some women have medical problems that increase the chance of getting cervical cancer. Talk to your health care provider about these problems. It is especially important to talk to your health care provider if a  new problem develops soon after your last Pap test. In these cases, your health care provider may recommend more frequent screening and Pap tests.   - The above recommendations are the same for women who have or have not gotten the vaccine for human papillomavirus (HPV).   - If you had a hysterectomy for a problem that was not cancer or a condition that could lead to cancer, then you no longer need Pap tests. Even if you no longer need a Pap test, a regular exam is a good idea to make sure no other problems are starting.   - If you are between ages 36 and 66 years, and you have had normal Pap tests going back 10 years, you no longer need Pap tests. Even if you no longer need a Pap test, a regular exam is a good idea to make sure no other problems are starting.   - If you have had past treatment for cervical cancer or a condition that could lead to cancer, you need Pap tests and screening for cancer for at least 20 years after your treatment.   - If Pap tests have been discontinued, risk factors (such as a new sexual partner) need to be reassessed to determine if screening should  be resumed.   - The HPV test is an additional test that may be used for cervical cancer screening. The HPV test looks for the virus that can cause the cell changes on the cervix. The cells collected during the Pap test can be tested for HPV. The HPV test could be used to screen women aged 70 years and older, and should be used in women of any age who have unclear Pap test results. After the age of 67, women should have HPV testing at the same frequency as a Pap test.   Colorectal cancer can be detected and often prevented. Most routine colorectal cancer screening begins at the age of 57 years and continues through age 26 years. However, your health care provider may recommend screening at an earlier age if you have risk factors for colon cancer. On a yearly basis, your health care provider may provide home test kits to check for hidden blood in the stool.  Use of a small camera at the end of a tube, to directly examine the colon (sigmoidoscopy or colonoscopy), can detect the earliest forms of colorectal cancer. Talk to your health care provider about this at age 23, when routine screening begins. Direct exam of the colon should be repeated every 5 -10 years through age 49 years, unless early forms of pre-cancerous polyps or small growths are found.   People who are at an increased risk for hepatitis B should be screened for this virus. You are considered at high risk for hepatitis B if:  -You were born in a country where hepatitis B occurs often. Talk with your health care provider about which countries are considered high risk.  - Your parents were born in a high-risk country and you have not received a shot to protect against hepatitis B (hepatitis B vaccine).  - You have HIV or AIDS.  - You use needles to inject street drugs.  - You live with, or have sex with, someone who has Hepatitis B.  - You get hemodialysis treatment.  - You take certain medicines for conditions like cancer, organ  transplantation, and autoimmune conditions.   Hepatitis C blood testing is recommended for all people born from [birth year] through 1965 and any individual  with known risks for hepatitis C.   Practice safe sex. Use condoms and avoid high-risk sexual practices to reduce the spread of sexually transmitted infections (STIs). STIs include gonorrhea, chlamydia, syphilis, trichomonas, herpes, HPV, and human immunodeficiency virus (HIV). Herpes, HIV, and HPV are viral illnesses that have no cure. They can result in disability, cancer, and death. Sexually active women aged 25 years and younger should be checked for chlamydia. Older women with new or multiple partners should also be tested for chlamydia. Testing for other STIs is recommended if you are sexually active and at increased risk.   Osteoporosis is a disease in which the bones lose minerals and strength with aging. This can result in serious bone fractures or breaks. The risk of osteoporosis can be identified using a bone density scan. Women ages 65 years and over and women at risk for fractures or osteoporosis should discuss screening with their health care providers. Ask your health care provider whether you should take a calcium supplement or vitamin D to There are also several preventive steps women can take to avoid osteoporosis and resulting fractures or to keep osteoporosis from worsening. -->Recommendations include:  Eat a balanced diet high in fruits, vegetables, calcium, and vitamins.  Get enough calcium. The recommended total intake of is 1,200 mg daily; for best absorption, if taking supplements, divide doses into 250-500 mg doses throughout the day. Of the two types of calcium, calcium carbonate is best absorbed when taken with food but calcium citrate can be taken on an empty stomach.  Get enough vitamin D. NAMS and the National Osteoporosis Foundation recommend at least 1,000 IU per day for women age 50 and over who are at risk of vitamin D  deficiency. Vitamin D deficiency can be caused by inadequate sun exposure (for example, those who live in northern latitudes).  Avoid alcohol and smoking. Heavy alcohol intake (more than 7 drinks per week) increases the risk of falls and hip fracture and women smokers tend to lose bone more rapidly and have lower bone mass than nonsmokers. Stopping smoking is one of the most important changes women can make to improve their health and decrease risk for disease.  Be physically active every day. Weight-bearing exercise (for example, fast walking, hiking, jogging, and weight training) may strengthen bones or slow the rate of bone loss that comes with aging. Balancing and muscle-strengthening exercises can reduce the risk of falling and fracture.  Consider therapeutic medications. Currently, several types of effective drugs are available. Healthcare providers can recommend the type most appropriate for each woman.  Eliminate environmental factors that may contribute to accidents. Falls cause nearly 90% of all osteoporotic fractures, so reducing this risk is an important bone-health strategy. Measures include ample lighting, removing obstructions to walking, using nonskid rugs on floors, and placing mats and/or grab bars in showers.  Be aware of medication side effects. Some common medicines make bones weaker. These include a type of steroid drug called glucocorticoids used for arthritis and asthma, some antiseizure drugs, certain sleeping pills, treatments for endometriosis, and some cancer drugs. An overactive thyroid gland or using too much thyroid hormone for an underactive thyroid can also be a problem. If you are taking these medicines, talk to your doctor about what you can do to help protect your bones.reduce the rate of osteoporosis.    Menopause can be associated with physical symptoms and risks. Hormone replacement therapy is available to decrease symptoms and risks. You should talk to your  health care provider   about whether hormone replacement therapy is right for you.   Use sunscreen. Apply sunscreen liberally and repeatedly throughout the day. You should seek shade when your shadow is shorter than you. Protect yourself by wearing long sleeves, pants, a wide-brimmed hat, and sunglasses year round, whenever you are outdoors.   Once a month, do a whole body skin exam, using a mirror to look at the skin on your back. Tell your health care provider of new moles, moles that have irregular borders, moles that are larger than a pencil eraser, or moles that have changed in shape or color.   -Stay current with required vaccines (immunizations).   Influenza vaccine. All adults should be immunized every year.  Tetanus, diphtheria, and acellular pertussis (Td, Tdap) vaccine. Pregnant women should receive 1 dose of Tdap vaccine during each pregnancy. The dose should be obtained regardless of the length of time since the last dose. Immunization is preferred during the 27th 36th week of gestation. An adult who has not previously received Tdap or who does not know her vaccine status should receive 1 dose of Tdap. This initial dose should be followed by tetanus and diphtheria toxoids (Td) booster doses every 10 years. Adults with an unknown or incomplete history of completing a 3-dose immunization series with Td-containing vaccines should begin or complete a primary immunization series including a Tdap dose. Adults should receive a Td booster every 10 years.  Varicella vaccine. An adult without evidence of immunity to varicella should receive 2 doses or a second dose if she has previously received 1 dose. Pregnant females who do not have evidence of immunity should receive the first dose after pregnancy. This first dose should be obtained before leaving the health care facility. The second dose should be obtained 4 8 weeks after the first dose.  Human papillomavirus (HPV) vaccine. Females aged 13 26  years who have not received the vaccine previously should obtain the 3-dose series. The vaccine is not recommended for use in pregnant females. However, pregnancy testing is not needed before receiving a dose. If a female is found to be pregnant after receiving a dose, no treatment is needed. In that case, the remaining doses should be delayed until after the pregnancy. Immunization is recommended for any person with an immunocompromised condition through the age of 26 years if she did not get any or all doses earlier. During the 3-dose series, the second dose should be obtained 4 8 weeks after the first dose. The third dose should be obtained 24 weeks after the first dose and 16 weeks after the second dose.  Zoster vaccine. One dose is recommended for adults aged 60 years or older unless certain conditions are present.  Measles, mumps, and rubella (MMR) vaccine. Adults born before 1957 generally are considered immune to measles and mumps. Adults born in 1957 or later should have 1 or more doses of MMR vaccine unless there is a contraindication to the vaccine or there is laboratory evidence of immunity to each of the three diseases. A routine second dose of MMR vaccine should be obtained at least 28 days after the first dose for students attending postsecondary schools, health care workers, or international travelers. People who received inactivated measles vaccine or an unknown type of measles vaccine during 1963 1967 should receive 2 doses of MMR vaccine. People who received inactivated mumps vaccine or an unknown type of mumps vaccine before 1979 and are at high risk for mumps infection should consider immunization with 2 doses of   MMR vaccine. For females of childbearing age, rubella immunity should be determined. If there is no evidence of immunity, females who are not pregnant should be vaccinated. If there is no evidence of immunity, females who are pregnant should delay immunization until after pregnancy.  Unvaccinated health care workers born before [birth year] who lack laboratory evidence of measles, mumps, or rubella immunity or laboratory confirmation of disease should consider measles and mumps immunization with 2 doses of MMR vaccine or rubella immunization with 1 dose of MMR vaccine.  Pneumococcal 13-valent conjugate (PCV13) vaccine. When indicated, a person who is uncertain of her immunization history and has no record of immunization should receive the PCV13 vaccine. An adult aged 54 years or older who has certain medical conditions and has not been previously immunized should receive 1 dose of PCV13 vaccine. This PCV13 should be followed with a dose of pneumococcal polysaccharide (PPSV23) vaccine. The PPSV23 vaccine dose should be obtained at least 8 weeks after the dose of PCV13 vaccine. An adult aged 58 years or older who has certain medical conditions and previously received 1 or more doses of PPSV23 vaccine should receive 1 dose of PCV13. The PCV13 vaccine dose should be obtained 1 or more years after the last PPSV23 vaccine dose.  Pneumococcal polysaccharide (PPSV23) vaccine. When PCV13 is also indicated, PCV13 should be obtained first. All adults aged 58 years and older should be immunized. An adult younger than age 65 years who has certain medical conditions should be immunized. Any person who resides in a nursing home or long-term care facility should be immunized. An adult smoker should be immunized. People with an immunocompromised condition and certain other conditions should receive both PCV13 and PPSV23 vaccines. People with human immunodeficiency virus (HIV) infection should be immunized as soon as possible after diagnosis. Immunization during chemotherapy or radiation therapy should be avoided. Routine use of PPSV23 vaccine is not recommended for American Indians, [place] Natives, or people younger than 65 years unless there are medical conditions that require PPSV23 vaccine. When indicated,  people who have unknown immunization and have no record of immunization should receive PPSV23 vaccine. One-time revaccination 5 years after the first dose of PPSV23 is recommended for people aged 70 64 years who have chronic kidney failure, nephrotic syndrome, asplenia, or immunocompromised conditions. People who received 1 2 doses of PPSV23 before age 32 years should receive another dose of PPSV23 vaccine at age 96 years or later if at least 5 years have passed since the previous dose. Doses of PPSV23 are not needed for people immunized with PPSV23 at or after age 55 years.  Meningococcal vaccine. Adults with asplenia or persistent complement component deficiencies should receive 2 doses of quadrivalent meningococcal conjugate (MenACWY-D) vaccine. The doses should be obtained at least 2 months apart. Microbiologists working with certain meningococcal bacteria, [employer] recruits, people at risk during an outbreak, and people who travel to or live in countries with a high rate of meningitis should be immunized. A first-year college student up through age 58 years who is living in a residence hall should receive a dose if she did not receive a dose on or after her 16th birthday. Adults who have certain high-risk conditions should receive one or more doses of vaccine.  Hepatitis A vaccine. Adults who wish to be protected from this disease, have certain high-risk conditions, work with hepatitis A-infected animals, work in hepatitis A research labs, or travel to or work in countries with a high rate of hepatitis A should be  immunized. Adults who were previously unvaccinated and who anticipate close contact with an international adoptee during the first 60 days after arrival in the [place] from a country with a high rate of hepatitis A should be immunized.  Hepatitis B vaccine.  Adults who wish to be protected from this disease, have certain high-risk conditions, may be exposed to blood or other infectious  body fluids, are household contacts or sex partners of hepatitis B positive people, are clients or workers in certain care facilities, or travel to or work in countries with a high rate of hepatitis B should be immunized.  Haemophilus influenzae type b (Hib) vaccine. A previously unvaccinated person with asplenia or sickle cell disease or having a scheduled splenectomy should receive 1 dose of Hib vaccine. Regardless of previous immunization, a recipient of a hematopoietic stem cell transplant should receive a 3-dose series 6 12 months after her successful transplant. Hib vaccine is not recommended for adults with HIV infection.  Preventive Services / Frequency Ages 6 to 39years  Blood pressure check.** / Every 1 to 2 years.  Lipid and cholesterol check.** / Every 5 years beginning at age 39.  Clinical breast exam.** / Every 3 years for women in their 61s and 62s.  BRCA-related cancer risk assessment.** / For women who have family members with a BRCA-related cancer (breast, ovarian, tubal, or peritoneal cancers).  Pap test.** / Every 2 years from ages 47 through 85. Every 3 years starting at age 34 through age 12 or 74 with a history of 3 consecutive normal Pap tests.  HPV screening.** / Every 3 years from ages 46 through ages 43 to 54 with a history of 3 consecutive normal Pap tests.  Hepatitis C blood test.** / For any individual with known risks for hepatitis C.  Skin self-exam. / Monthly.  Influenza vaccine. / Every year.  Tetanus, diphtheria, and acellular pertussis (Tdap, Td) vaccine.** / Consult your health care provider. Pregnant women should receive 1 dose of Tdap vaccine during each pregnancy. 1 dose of Td every 10 years.  Varicella vaccine.** / Consult your health care provider. Pregnant females who do not have evidence of immunity should receive the first dose after pregnancy.  HPV vaccine. / 3 doses over 6 months, if 64 and younger. The vaccine is not recommended for use in  pregnant females. However, pregnancy testing is not needed before receiving a dose.  Measles, mumps, rubella (MMR) vaccine.** / You need at least 1 dose of MMR if you were born in 1957 or later. You may also need a 2nd dose. For females of childbearing age, rubella immunity should be determined. If there is no evidence of immunity, females who are not pregnant should be vaccinated. If there is no evidence of immunity, females who are pregnant should delay immunization until after pregnancy.  Pneumococcal 13-valent conjugate (PCV13) vaccine.** / Consult your health care provider.  Pneumococcal polysaccharide (PPSV23) vaccine.** / 1 to 2 doses if you smoke cigarettes or if you have certain conditions.  Meningococcal vaccine.** / 1 dose if you are age 71 to 38 years and a Market researcher living in a residence hall, or have one of several medical conditions, you need to get vaccinated against meningococcal disease. You may also need additional booster doses.  Hepatitis A vaccine.** / Consult your health care provider.  Hepatitis B vaccine.** / Consult your health care provider.  Haemophilus influenzae type b (Hib) vaccine.** / Consult your health care provider.  Ages 55 to 64years  Blood pressure check.** / Every 1 to 2 years.  Lipid and cholesterol check.** / Every 5 years beginning at age 20 years.  Lung cancer screening. / Every year if you are aged 55 80 years and have a 30-pack-year history of smoking and currently smoke or have quit within the past 15 years. Yearly screening is stopped once you have quit smoking for at least 15 years or develop a health problem that would prevent you from having lung cancer treatment.  Clinical breast exam.** / Every year after age 40 years.  BRCA-related cancer risk assessment.** / For women who have family members with a BRCA-related cancer (breast, ovarian, tubal, or peritoneal cancers).  Mammogram.** / Every year beginning at age 40  years and continuing for as long as you are in good health. Consult with your health care provider.  Pap test.** / Every 3 years starting at age 30 years through age 65 or 70 years with a history of 3 consecutive normal Pap tests.  HPV screening.** / Every 3 years from ages 30 years through ages 65 to 70 years with a history of 3 consecutive normal Pap tests.  Fecal occult blood test (FOBT) of stool. / Every year beginning at age 50 years and continuing until age 75 years. You may not need to do this test if you get a colonoscopy every 10 years.  Flexible sigmoidoscopy or colonoscopy.** / Every 5 years for a flexible sigmoidoscopy or every 10 years for a colonoscopy beginning at age 50 years and continuing until age 75 years.  Hepatitis C blood test.** / For all people born from 1945 through 1965 and any individual with known risks for hepatitis C.  Skin self-exam. / Monthly.  Influenza vaccine. / Every year.  Tetanus, diphtheria, and acellular pertussis (Tdap/Td) vaccine.** / Consult your health care provider. Pregnant women should receive 1 dose of Tdap vaccine during each pregnancy. 1 dose of Td every 10 years.  Varicella vaccine.** / Consult your health care provider. Pregnant females who do not have evidence of immunity should receive the first dose after pregnancy.  Zoster vaccine.** / 1 dose for adults aged 60 years or older.  Measles, mumps, rubella (MMR) vaccine.** / You need at least 1 dose of MMR if you were born in 1957 or later. You may also need a 2nd dose. For females of childbearing age, rubella immunity should be determined. If there is no evidence of immunity, females who are not pregnant should be vaccinated. If there is no evidence of immunity, females who are pregnant should delay immunization until after pregnancy.  Pneumococcal 13-valent conjugate (PCV13) vaccine.** / Consult your health care provider.  Pneumococcal polysaccharide (PPSV23) vaccine.** / 1 to 2 doses if  you smoke cigarettes or if you have certain conditions.  Meningococcal vaccine.** / Consult your health care provider.  Hepatitis A vaccine.** / Consult your health care provider.  Hepatitis B vaccine.** / Consult your health care provider.  Haemophilus influenzae type b (Hib) vaccine.** / Consult your health care provider.  Ages 65 years and over  Blood pressure check.** / Every 1 to 2 years.  Lipid and cholesterol check.** / Every 5 years beginning at age 20 years.  Lung cancer screening. / Every year if you are aged 55 80 years and have a 30-pack-year history of smoking and currently smoke or have quit within the past 15 years. Yearly screening is stopped once you have quit smoking for at least 15 years or develop a health problem that   would prevent you from having lung cancer treatment.  Clinical breast exam.** / Every year after age 103 years.  BRCA-related cancer risk assessment.** / For women who have family members with a BRCA-related cancer (breast, ovarian, tubal, or peritoneal cancers).  Mammogram.** / Every year beginning at age 36 years and continuing for as long as you are in good health. Consult with your health care provider.  Pap test.** / Every 3 years starting at age 5 years through age 85 or 10 years with 3 consecutive normal Pap tests. Testing can be stopped between 65 and 70 years with 3 consecutive normal Pap tests and no abnormal Pap or HPV tests in the past 10 years.  HPV screening.** / Every 3 years from ages 93 years through ages 70 or 45 years with a history of 3 consecutive normal Pap tests. Testing can be stopped between 65 and 70 years with 3 consecutive normal Pap tests and no abnormal Pap or HPV tests in the past 10 years.  Fecal occult blood test (FOBT) of stool. / Every year beginning at age 8 years and continuing until age 45 years. You may not need to do this test if you get a colonoscopy every 10 years.  Flexible sigmoidoscopy or colonoscopy.** /  Every 5 years for a flexible sigmoidoscopy or every 10 years for a colonoscopy beginning at age 69 years and continuing until age 68 years.  Hepatitis C blood test.** / For all people born from [birth year] through 1964 and any individual with known risks for hepatitis C.  Osteoporosis screening.** / A one-time screening for women ages 7 years and over and women at risk for fractures or osteoporosis.  Skin self-exam. / Monthly.  Influenza vaccine. / Every year.  Tetanus, diphtheria, and acellular pertussis (Tdap/Td) vaccine.** / 1 dose of Td every 10 years.  Varicella vaccine.** / Consult your health care provider.  Zoster vaccine.** / 1 dose for adults aged 5 years or older.  Pneumococcal 13-valent conjugate (PCV13) vaccine.** / Consult your health care provider.  Pneumococcal polysaccharide (PPSV23) vaccine.** / 1 dose for all adults aged 74 years and older.  Meningococcal vaccine.** / Consult your health care provider.  Hepatitis A vaccine.** / Consult your health care provider.  Hepatitis B vaccine.** / Consult your health care provider.  Haemophilus influenzae type b (Hib) vaccine.** / Consult your health care provider. ** Family history and personal history of risk and conditions may change your health care provider's recommendations. Document Released: 01/17/2002 Document Revised: 09/11/2013  [facility] Patient Information 2014 [place], [place].   EXERCISE AND DIET:  We recommended that you start or continue a regular exercise program for good health. Regular exercise means any activity that makes your heart beat faster and makes you sweat.  We recommend exercising at least 30 minutes per day at least 3 days a week, preferably 5.  We also recommend a diet low in fat and sugar / carbohydrates.  Inactivity, poor dietary choices and obesity can cause diabetes, heart attack, stroke, and kidney damage, among others.     ALCOHOL AND SMOKING:  Women should limit their alcohol intake to no  more than 7 drinks/beers/glasses of wine (combined, not each!) per week. Moderation of alcohol intake to this level decreases your risk of breast cancer and liver damage.  ( And of course, no recreational drugs are part of a healthy lifestyle.)  Also, you should not be smoking at all or even being exposed to second hand smoke. Most people know smoking can  cause cancer, and various heart and lung diseases, but did you know it also contributes to weakening of your bones?  Aging of your skin?  Yellowing of your teeth and nails?   CALCIUM AND VITAMIN D:  Adequate intake of calcium and Vitamin D are recommended.  The recommendations for exact amounts of these supplements seem to change often, but generally speaking 600 mg of calcium (either carbonate or citrate) and 800 units of Vitamin D per day seems prudent. Certain women may benefit from higher intake of Vitamin D.  If you are among these women, your doctor will have told you during your visit.     PAP SMEARS:  Pap smears, to check for cervical cancer or precancers,  have traditionally been done yearly, although recent scientific advances have shown that most women can have pap smears less often.  However, every woman still should have a physical exam from her gynecologist or primary care physician every year. It will include a breast check, inspection of the vulva and vagina to check for abnormal growths or skin changes, a visual exam of the cervix, and then an exam to evaluate the size and shape of the uterus and ovaries.  And after 51 years of age, a rectal exam is indicated to check for rectal cancers. We will also provide age appropriate advice regarding health maintenance, like when you should have certain vaccines, screening for sexually transmitted diseases, bone density testing, colonoscopy, mammograms, etc.    MAMMOGRAMS:  All women over 71 years old should have a yearly mammogram. Many facilities now offer a "3D" mammogram, which may cost  around $50 extra out of pocket. If possible,  we recommend you accept the option to have the 3D mammogram performed.  It both reduces the number of women who will be called back for extra views which then turn out to be normal, and it is better than the routine mammogram at detecting truly abnormal areas.     COLONOSCOPY:  Colonoscopy to screen for colon cancer is recommended for all women at age 52.  We know, you hate the idea of the prep.  We agree, BUT, having colon cancer and not knowing it is worse!!  Colon cancer so often starts as a polyp that can be seen and removed at colonscopy, which can quite literally save your life!  And if your first colonoscopy is normal and you have no family history of colon cancer, most women don't have to have it again for 10 years.  Once every ten years, you can do something that may end up saving your life, right?  We will be happy to help you get it scheduled when you are ready.  Be sure to check your insurance coverage so you understand how much it will cost.  It may be covered as a preventative service at no cost, but you should check your particular policy.

## 2018-10-04 LAB — COMPREHENSIVE METABOLIC PANEL
ALBUMIN: 3.9 g/dL (ref 3.5–5.5)
ALK PHOS: 65 IU/L (ref 39–117)
ALT: 28 IU/L (ref 0–32)
AST: 22 IU/L (ref 0–40)
Albumin/Globulin Ratio: 1.7 (ref 1.2–2.2)
BILIRUBIN TOTAL: 0.5 mg/dL (ref 0.0–1.2)
BUN / CREAT RATIO: 20 (ref 9–23)
BUN: 11 mg/dL (ref 6–24)
CHLORIDE: 102 mmol/L (ref 96–106)
CO2: 24 mmol/L (ref 20–29)
Calcium: 10 mg/dL (ref 8.7–10.2)
Creatinine, Ser: 0.54 mg/dL — ABNORMAL LOW (ref 0.57–1.00)
GFR calc Af Amer: 126 mL/min/{1.73_m2} (ref >59)
GFR calc non Af Amer: 110 mL/min/{1.73_m2} (ref >59)
GLUCOSE: 128 mg/dL — AB (ref 65–99)
Globulin, Total: 2.3 g/dL (ref 1.5–4.5)
POTASSIUM: 4.3 mmol/L (ref 3.5–5.2)
Sodium: 141 mmol/L (ref 134–144)
Total Protein: 6.2 g/dL (ref 6.0–8.5)

## 2018-10-04 LAB — CBC WITH DIFFERENTIAL/PLATELET
BASOS ABS: 0.1 10*3/uL (ref 0.0–0.2)
Basos: 1 %
EOS (ABSOLUTE): 0.3 10*3/uL (ref 0.0–0.4)
Eos: 2 %
Hematocrit: 34.3 % (ref 34.0–46.6)
Hemoglobin: 12.1 g/dL (ref 11.1–15.9)
Immature Grans (Abs): 0 10*3/uL (ref 0.0–0.1)
Immature Granulocytes: 0 %
LYMPHS ABS: 13.6 10*3/uL — AB (ref 0.7–3.1)
Lymphs: 69 %
MCH: 29.3 pg (ref 26.6–33.0)
MCHC: 35.3 g/dL (ref 31.5–35.7)
MCV: 83 fL (ref 79–97)
MONOCYTES: 5 %
Monocytes Absolute: 0.9 10*3/uL (ref 0.1–0.9)
Neutrophils Absolute: 4.3 10*3/uL (ref 1.4–7.0)
Neutrophils: 23 %
PLATELETS: 270 10*3/uL (ref 150–450)
RBC: 4.13 x10E6/uL (ref 3.77–5.28)
RDW: 13.3 % (ref 12.3–15.4)
WBC: 19.4 10*3/uL — AB (ref 3.4–10.8)

## 2018-10-04 LAB — HEMOGLOBIN A1C
Est. average glucose Bld gHb Est-mCnc: 148 mg/dL
Hgb A1c MFr Bld: 6.8 % — ABNORMAL HIGH (ref 4.8–5.6)

## 2018-10-04 LAB — LIPID PANEL
CHOL/HDL RATIO: 2.1 ratio (ref 0.0–4.4)
Cholesterol, Total: 112 mg/dL (ref 100–199)
HDL: 53 mg/dL (ref >39)
LDL CALC: 37 mg/dL (ref 0–99)
Triglycerides: 109 mg/dL (ref 0–149)
VLDL Cholesterol Cal: 22 mg/dL (ref 5–40)

## 2018-10-04 LAB — TSH: TSH: 4.81 u[IU]/mL — ABNORMAL HIGH (ref 0.450–4.500)

## 2018-10-04 LAB — VITAMIN D 25 HYDROXY (VIT D DEFICIENCY, FRACTURES): Vit D, 25-Hydroxy: 49.5 ng/mL (ref 30.0–100.0)

## 2018-10-04 LAB — T4, FREE: FREE T4: 1.39 ng/dL (ref 0.82–1.77)

## 2018-10-05 ENCOUNTER — Other Ambulatory Visit: Payer: Self-pay | Admitting: Family Medicine

## 2018-10-05 DIAGNOSIS — E1159 Type 2 diabetes mellitus with other circulatory complications: Secondary | ICD-10-CM

## 2018-10-05 DIAGNOSIS — I10 Essential (primary) hypertension: Principal | ICD-10-CM

## 2018-10-08 LAB — CYTOLOGY - PAP
DIAGNOSIS: NEGATIVE
HPV: NOT DETECTED

## 2018-10-17 ENCOUNTER — Encounter: Payer: Self-pay | Admitting: Family Medicine

## 2018-10-24 ENCOUNTER — Ambulatory Visit
Admission: RE | Admit: 2018-10-24 | Discharge: 2018-10-24 | Disposition: A | Discharge status: Home / Self Care | Source: Ambulatory Visit | Attending: Family Medicine | Admitting: Family Medicine

## 2018-10-24 DIAGNOSIS — Z1239 Encounter for other screening for malignant neoplasm of breast: Secondary | ICD-10-CM

## 2018-10-24 DIAGNOSIS — C9110 Chronic lymphocytic leukemia of B-cell type not having achieved remission: Secondary | ICD-10-CM | POA: Diagnosis not present

## 2018-10-24 LAB — CBC AND DIFFERENTIAL
HCT: 39 (ref 36–46)
HEMOGLOBIN: 12.5 (ref 12.0–16.0)
Platelets: 294 (ref 150–399)
WBC: 19

## 2018-11-09 ENCOUNTER — Telehealth: Payer: Self-pay | Admitting: Family Medicine

## 2018-11-09 NOTE — Telephone Encounter (Signed)
Rachel Simmons from [employer] called to request authorization on pt's Rx for Saxenda--forwarding message to medical assistant to call [phone]    --glh

## 2018-11-12 NOTE — Telephone Encounter (Signed)
Called PA needs to be restarted.  Contacted [employer] for clinical questions. MPulliam, CMA/RT(R)

## 2018-12-18 ENCOUNTER — Encounter: Payer: Self-pay | Admitting: Adult Health

## 2018-12-18 ENCOUNTER — Ambulatory Visit (INDEPENDENT_AMBULATORY_CARE_PROVIDER_SITE_OTHER): Admitting: Adult Health

## 2018-12-18 VITALS — BP 126/72 | HR 100 | Temp 99.5°F | Ht 67.75 in | Wt 264.4 lb

## 2018-12-18 DIAGNOSIS — R6889 Other general symptoms and signs: Secondary | ICD-10-CM | POA: Insufficient documentation

## 2018-12-18 LAB — POCT INFLUENZA A/B
INFLUENZA A, POC: NEGATIVE
INFLUENZA B, POC: NEGATIVE

## 2018-12-18 MED ORDER — OSELTAMIVIR PHOSPHATE 75 MG PO CAPS: 75.0000 mg | ORAL_CAPSULE | Freq: Two times a day (BID) | ORAL | 0 refills | Status: DC

## 2018-12-18 MED ORDER — HYDROCOD POLST-CPM POLST ER 10-8 MG/5ML PO SUER: 5.0000 mL | Freq: Two times a day (BID) | ORAL | 0 refills | Status: DC | PRN

## 2018-12-18 NOTE — Assessment & Plan Note (Signed)
Flu Test Negative, however your symptoms are very consistent with influenza Please take Tamiflu and Tussionex as directed. 10/03/18 GRF 110 Continue to push fluids and use OTC Acetaminophen as needed for fever/discomfort. Work Excuse provided- please stay home until you are fever free for at least 24 hrs without Acetaminophen.

## 2018-12-18 NOTE — Progress Notes (Signed)
Subjective:    Patient ID: Rachel Simmons, female    DOB: 14-Jun-1966, 53 y.o.   MRN: [id number]  HPI:  Rachel Simmons presents with constant non-productive cough, copious clear nasal drainage, sore throat (ache, 9/10), ear pain (fullness and ache, 10/10), body aches,  that abruptly started 'Sunday (48 hrs ago). She has been using a myriad of OTC herbal remedies and OTC Acetaminophen, last dose 4 hrs ago, current temp 99.5 f oral She denies CP/dyspnea at rest She works at [employer]  Patient Care Team    Relationship Specialty Notifications Start End  Opalski, Deborah, DO PCP - General Family Medicine  08/18/16   Lewis, Dequincy A, MD Consulting Physician Oncology  08/29/17   Le, My Hong, OD Referring Physician Optometry  10/10/17     Patient Active Problem List   Diagnosis Date Noted  . Flu-like symptoms 12/18/2018  . Myalgias and generalized arthralgias 06/20/2018  . High risk for vaginitis due to Candida secondary to antibiotic use 06/06/2018  . Left tennis elbow 10/10/2017  . Muscle dysfunction/ spasm- upper traps on L 10/10/2017  . Hyperlipidemia associated with type 2 diabetes mellitus (HCC) 08/29/2017  . Hypothyroidism 08/29/2017  . Hypertriglyceridemia 10/18/2016  . Insomnia 10/18/2016  . 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 Family history of colon cancer requiring screening colonoscopy 08/20/2016  . Obesity, Class III, BMI 40-49.9 (morbid obesity) (HCC) 08/20/2016  . Adjustment disorder with mixed anxiety and depressed mood 08/20/2016  . Chronic lymphoblastic leukemia 08/18/2016  . Type II Diabetes mellitus without complication (HCC) 08/18/2016  . Hypertension associated with diabetes (HCC) 08/18/2016  . Thyroid disease 08/18/2016  . Chronic back pain greater than 3 months duration 08/18/2016  . Genetic testing 05/21/2015  . Family history of breast cancer   . Family history of colon cancer- brother age 52   . Family history of polyps in the colon   . Diabetic  peripheral neuropathy (HCC) 04/21/2015     Past Medical History:  Diagnosis Date  . Chronic lymphoblastic leukemia   . Diabetes mellitus without complication (HCC)   . Family history of breast cancer   . Family history of colon cancer   . Family history of polyps in the colon   . Hypertension   . Thyroid disease      Past Surgical History:  Procedure Laterality Date  . ABDOMINAL HYSTERECTOMY  2006   reports ovaries and cervix are intact  . TONSILLECTOMY       Family History  Problem Relation Age of Onset  . Breast cancer Mother        40' s; deceased 98  . Colon polyps Mother        Dx 71s; #/type unknown  . Cancer Father        lung cancer; smoker; deceased 92  . Breast cancer Sister 35       triple negative; currently 52  . Cancer Maternal Aunt        lung cancer; smoker; currently 68  . Colon cancer Maternal Uncle        Dx 56s; currently 37  . Breast cancer Paternal Aunt        Dx 27s; currently 43s  . Cancer Paternal Uncle        liver; heavy drinker  . Cancer Maternal Grandfather        unk. primary; deceased 48s  . Cancer Paternal Grandmother        Dx 60s; unknown primary  .  Breast cancer Sister 61       currently 3  . Uterine cancer Sister 45       currently 69  . Cancer Paternal Uncle        lung; heavy smoker  . Breast cancer Cousin        mat first cousin through aunt; poss. breast ca; had mastectomy; currently 50  . Ovarian cancer Other        mat grandmother's sister  . Cancer Other        stomach cancer; mat grandmother's sister  . Colon polyps Brother        age, #, type unknown     Social History   Substance and Sexual Activity  Drug Use No     Social History   Substance and Sexual Activity  Alcohol Use No     Social History   Tobacco Use  Smoking Status Never Smoker  Smokeless Tobacco Never Used     Outpatient Encounter Medications as of 12/18/2018  Medication Sig Note  . aspirin 81 MG chewable tablet Chew 81 mg by  mouth daily.   Marland Kitchen atorvastatin (LIPITOR) 20 MG tablet TAKE 1 TABLET AT BEDTIME   . calcium carbonate (CALCIUM 600) 600 MG TABS tablet Take 1 tablet by mouth daily. 08/18/2016: Received from: [place]: Take 600 mg by mouth once daily.  . cetirizine (ZYRTEC) 5 MG tablet Take 1 tablet by mouth daily. 08/18/2016: Received from: [place]: Take 5 mg by mouth once daily.  . Cholecalciferol (VITAMIN D3) 5000 units TABS 5,000 IU OTC vitamin D3 daily.   Marland Kitchen escitalopram (LEXAPRO) 20 MG tablet TAKE 1 TABLET DAILY (NEED OFFICE VISIT FOR FURTHER REFILLS)   . glucose blood (FREESTYLE LITE) test strip CHECK FASTING BLOOD SUGAR AND 2 HOURS AFTER A MEAL   . LEVOXYL 112 MCG tablet TAKE 1 TABLET DAILY   . Liraglutide -Weight Management (SAXENDA) 18 MG/3ML SOPN Inject 3 mg into the skin daily. (dispense enough pens for 90 d supply)   . lisinopril-hydrochlorothiazide (PRINZIDE,ZESTORETIC) 20-25 MG tablet TAKE 1 TABLET DAILY   . metFORMIN (GLUCOPHAGE-XR) 500 MG 24 hr tablet Take 1 tablet (500 mg total) by mouth at bedtime.   . Multiple Vitamins-Minerals (ONE-A-DAY WOMENS 50 PLUS PO) Take 1 tablet daily by mouth.   . Omega-3 Fatty Acids (FISH OIL) 1200 MG CAPS Take 4-5 tabs daily   . Vitamin D, Ergocalciferol, (DRISDOL) 50000 units CAPS capsule TAKE 1 CAPSULE EVERY 7 DAYS   . chlorpheniramine-HYDROcodone (TUSSIONEX) 10-8 MG/5ML SUER Take 5 mLs by mouth every 12 (twelve) hours as needed.   Marland Kitchen oseltamivir (TAMIFLU) 75 MG capsule Take 1 capsule (75 mg total) by mouth 2 (two) times daily.    No facility-administered encounter medications on file as of 12/18/2018.     Allergies: Patient has no known allergies.  Body mass index is 40.5 kg/m.  Blood pressure 126/72, pulse 100, temperature 99.5 F (37.5 C), temperature source Oral, height 5' 7.75" (1.721 m), weight 264 lb 6.4 oz (119.9 kg), SpO2 95 %.  Review of Systems  Constitutional: Positive for  activity change, appetite change, chills, diaphoresis, fatigue and fever. Negative for unexpected weight change.  HENT: Positive for congestion, ear pain, facial swelling, nosebleeds, postnasal drip, rhinorrhea, sinus pressure, sinus pain, sneezing, sore throat and voice change.   Eyes: Negative for visual disturbance.  Respiratory: Positive for cough. Negative for chest tightness, shortness of breath, wheezing and stridor.  Cardiovascular: Negative for chest pain and palpitations.  Gastrointestinal: Negative for abdominal distention, abdominal pain, blood in stool, constipation, diarrhea, nausea and vomiting.  Endocrine: Negative for cold intolerance, heat intolerance, polydipsia, polyphagia and polyuria.  Genitourinary: Negative for difficulty urinating and flank pain.  Musculoskeletal: Positive for arthralgias and myalgias.  Skin: Negative for color change, pallor, rash and wound.  Neurological: Positive for headaches. Negative for dizziness.  Hematological: Does not bruise/bleed easily.  Psychiatric/Behavioral: Positive for sleep disturbance.       Objective:   Physical Exam Vitals signs and nursing note reviewed.  Constitutional:      Appearance: She is obese. She is ill-appearing and diaphoretic. She is not toxic-appearing.  HENT:     Head: Normocephalic and atraumatic.     Right Ear: Tympanic membrane is erythematous and bulging.     Left Ear: Tympanic membrane is erythematous and bulging.     Nose: Mucosal edema, congestion and rhinorrhea present.     Right Turbinates: Swollen.     Left Turbinates: Swollen.     Right Sinus: Maxillary sinus tenderness and frontal sinus tenderness present.     Left Sinus: Maxillary sinus tenderness and frontal sinus tenderness present.     Mouth/Throat:     Pharynx: Posterior oropharyngeal erythema present.     Tonsils: Swelling: 1+ on the right. 1+ on the left.  Eyes:     Extraocular Movements: Extraocular movements intact.      Conjunctiva/sclera: Conjunctivae normal.     Pupils: Pupils are equal, round, and reactive to light.  Neck:     Musculoskeletal: Normal range of motion.  Cardiovascular:     Rate and Rhythm: Tachycardia present.     Pulses: Normal pulses.     Heart sounds: Normal heart sounds. No murmur. No friction rub. No gallop.   Pulmonary:     Effort: Pulmonary effort is normal. No respiratory distress.     Breath sounds: Normal breath sounds. No stridor. No wheezing, rhonchi or rales.  Chest:     Chest wall: No tenderness.  Lymphadenopathy:     Cervical: No cervical adenopathy.  Skin:    Capillary Refill: Capillary refill takes less than 2 seconds.  Neurological:     Mental Status: She is alert and oriented to person, place, and time.  Psychiatric:        Mood and Affect: Mood normal.        Behavior: Behavior normal.        Thought Content: Thought content normal.        Judgment: Judgment normal.       Assessment & Plan:   1. Flu-like symptoms    Flu-like symptoms Flu Test Negative, however your symptoms are very consistent with influenza Please take Tamiflu and Tussionex as directed. 10/03/18 GRF 110 Continue to push fluids and use OTC Acetaminophen as needed for fever/discomfort. Work Excuse provided- please stay home until you are fever free for at least 24 hrs without Acetaminophen.  FOLLOW-UP:  Return if symptoms worsen or fail to improve.

## 2018-12-18 NOTE — Patient Instructions (Signed)

## 2019-01-01 ENCOUNTER — Ambulatory Visit: Admitting: Family Medicine

## 2019-01-04 ENCOUNTER — Encounter: Payer: Self-pay | Admitting: Family Medicine

## 2019-01-09 ENCOUNTER — Other Ambulatory Visit: Payer: Self-pay | Admitting: Family Medicine

## 2019-01-09 ENCOUNTER — Ambulatory Visit: Admitting: Family Medicine

## 2019-01-09 DIAGNOSIS — E785 Hyperlipidemia, unspecified: Principal | ICD-10-CM

## 2019-01-09 DIAGNOSIS — E1169 Type 2 diabetes mellitus with other specified complication: Secondary | ICD-10-CM

## 2019-01-18 ENCOUNTER — Other Ambulatory Visit: Payer: Self-pay | Admitting: Family Medicine

## 2019-01-20 ENCOUNTER — Other Ambulatory Visit: Payer: Self-pay | Admitting: Family Medicine

## 2019-01-20 DIAGNOSIS — E039 Hypothyroidism, unspecified: Secondary | ICD-10-CM

## 2019-01-22 ENCOUNTER — Telehealth: Payer: Self-pay | Admitting: Family Medicine

## 2019-01-22 NOTE — Telephone Encounter (Signed)
Called pt to set up 3 month provider required OV for Rx refill--left message  For cb/no answer.   --FYI to medical asst.  --glh

## 2019-01-22 NOTE — Telephone Encounter (Signed)
-----   Message from Jerilee Field, [place] sent at 01/21/2019 11:55 AM EST ----- Patient is due for 3 month follow up, please call the patient to make an appointment.  Thanks. MPulliam, CMA/RT(R)

## 2019-01-30 ENCOUNTER — Ambulatory Visit (INDEPENDENT_AMBULATORY_CARE_PROVIDER_SITE_OTHER): Admitting: Family Medicine

## 2019-01-30 ENCOUNTER — Encounter: Payer: Self-pay | Admitting: Family Medicine

## 2019-01-30 VITALS — BP 118/76 | HR 69 | Temp 97.8°F | Ht 67.75 in | Wt 268.8 lb

## 2019-01-30 DIAGNOSIS — E785 Hyperlipidemia, unspecified: Secondary | ICD-10-CM

## 2019-01-30 DIAGNOSIS — F339 Major depressive disorder, recurrent, unspecified: Secondary | ICD-10-CM | POA: Diagnosis not present

## 2019-01-30 DIAGNOSIS — E1159 Type 2 diabetes mellitus with other circulatory complications: Secondary | ICD-10-CM | POA: Diagnosis not present

## 2019-01-30 DIAGNOSIS — E079 Disorder of thyroid, unspecified: Secondary | ICD-10-CM

## 2019-01-30 DIAGNOSIS — I10 Essential (primary) hypertension: Secondary | ICD-10-CM

## 2019-01-30 DIAGNOSIS — E119 Type 2 diabetes mellitus without complications: Secondary | ICD-10-CM

## 2019-01-30 DIAGNOSIS — E1169 Type 2 diabetes mellitus with other specified complication: Secondary | ICD-10-CM

## 2019-01-30 LAB — POCT GLYCOSYLATED HEMOGLOBIN (HGB A1C): Hemoglobin A1C: 7.1 % — AB (ref 4.0–5.6)

## 2019-01-30 LAB — POCT UA - MICROALBUMIN
Albumin/Creatinine Ratio, Urine, POC: <30
Creatinine, POC: 100 mg/dL
Microalbumin Ur, POC: 10 mg/L

## 2019-01-30 MED ORDER — METFORMIN HCL 500 MG PO TABS: 500.0000 mg | ORAL_TABLET | Freq: Two times a day (BID) | ORAL | 3 refills | Status: DC

## 2019-01-30 MED ORDER — LIRAGLUTIDE -WEIGHT MANAGEMENT 18 MG/3ML ~~LOC~~ SOPN: 3.0000 mg | PEN_INJECTOR | Freq: Every day | SUBCUTANEOUS | 1 refills | Status: DC

## 2019-01-30 MED ORDER — ESCITALOPRAM OXALATE 20 MG PO TABS: 10.0000 mg | ORAL_TABLET | Freq: Every day | ORAL | 0 refills | Status: DC

## 2019-01-30 MED ORDER — FLUOXETINE HCL 20 MG PO CAPS: 20.0000 mg | ORAL_CAPSULE | Freq: Every day | ORAL | 0 refills | Status: DC

## 2019-01-30 NOTE — Patient Instructions (Addendum)
°  Because your insurance has already approve the liraglutide for weight loss, we will continue at the same dose but please know, that 1.5 mg daily dose is usually what we give for diabetes.  You can start and just take that daily or your work your way up to the 3 mg dose as written on the prescription    -Changing metformin to a twice daily from once daily dose, decreasing Lexapro to 10 mg daily and adding Prozac 20 mg daily.  Also restarting liraglutide for blood sugar mgt   Remember: self-care and self-love! Be more loving, caring, and compassionate to yourself.    Talk yourself as you would talk to another  Look into beginning yoga and mindfulness meditation- look into practicing better "self care"!  Remember to put yourself first- for 30-60 minutes per day, engaging in designated activity such as exercise, visiting the gym, reading a book, or otherwise taking time to treat yourself to an activity that you enjoy.  Meditation can be wonderful!      -Also we can help transition your brain into thinking more positively.  These tasks below are some things I want you to do every day for yourself 1)  write 3 new things that you are grateful for every day for 21 days  2)  exercise daily- walk for 15 minutes twice a day every day 3)  you are going to journal every day about one positive experience that you had 4)  meditate every day.  You can go on YouTube and look for 15-minute relaxation meditation or what ever.  But we need to make sure that you are in the moment and relaxing and deep breathing every day 5)  Write 1 positive email every day to praise someone in your life     - If you have insomnia or difficulty sleeping, this information is for you:  - Avoid caffeinated beverages after lunch,  no alcoholic beverages,  no eating within 2-3 hours of lying down,  avoid exposure to blue light before bed,  avoid daytime naps, and  needs to maintain a regular sleep schedule- go to sleep  and wake up around the same time every night.   - Resolve concerns or worries before entering bedroom:  Discussed relaxation techniques with patient and to keep a journal to write down fears\ worries.  I suggested seeing a counselor for CBT.   - Recommend patient meditate or do deep breathing exercises to help relax.   Incorporate the use of white noise machines or listen to "sleep meditation music", or recordings of guided meditations for sleep from YouTube which are free, such as  "guided meditation for detachment from over thinking"  by Mayford Knife.

## 2019-01-30 NOTE — Progress Notes (Signed)
Impression and Recommendations:    1. Type II Diabetes mellitus without complication ([place])   2. depressed mood, recurrent   3. Obesity, Class III, BMI 40-49.9 (morbid obesity) ([place])   4. Hypertension associated with diabetes ([place])   5. Hyperlipidemia associated with type 2 diabetes mellitus ([place])   6. Thyroid disease     1. Type II Diabetes mellitus without complication - Last B6L was drawn 10/03/2018, 6.8. - Patient's A1c today up to 7.1.  -  BS Suboptimally controlled. - Patient discontinued Liraglutide in November. - Take 500 metformin in AM and PM, and resume Liraglutide. See med list.  - Encouraged patient to listen to her body. - If you are not hungry, don't eat; and eat prudent foods, such as low-carb.  - Counseled patient on pathophysiology of disease and discussed various treatment options, which often includes dietary and lifestyle modifications as first line.  Importance of low carb/high protein/ low calorie diet discussed with patient in addition to regular exercise.   - Check FBS and 2 hours after the biggest meal of your day.  Keep log and bring in next OV for my review.   Also, if you ever feel poorly, please check your blood pressure and blood sugar, as one or the other could be the cause of your symptoms.  - Being a diabetic, you need yearly eye and foot exams. Make appt.for diabetic eye exam.  2. Hypertension Associated with Diabetes - Stable at this time. - Continue treatment as prescribed. - Patient tolerating meds well without S-E.  - Will continue to monitor.  3. Hyperlipidemia Associated with Diabetes - Need for lab work. - Continue treatment as prescribed. - Patient tolerating meds well without S-E.  - Will continue to monitor.  4. Microalbuminuria - WNL. - Educated patient about results today.  - Will continue to monitor.  5. Acute Stress & Depressed Mood, Recurrent - Decrease dose of Lexapro to one-half tablet, and begin 20 mg Prozac  due to fact that anxiety is less of issue now and more depression.  Also, pt would like more acute onset of med, and not something that would take wks to works ( wellbutrin)   - Reviewed the "spokes of the wheel" of mood and health management.  Stressed the importance of ongoing prudent habits, including regular exercise, appropriate sleep hygiene, healthful dietary habits, and prayer/meditation to relax.  - Encouraged patient to begin yoga/meditation for self-care and self-love. - Patient knows to look into meditation for self-love and self-care.  - Encouraged patient to put herself first for 15-20 minutes per day, engaging in designated activity for herself every day, such as a visit to the gym, reading a book, etc.  - Will continue to monitor.  6. Elevated TSH Last Check - Thyroid Disease - Need for re-check near future.  - Will continue to monitor.  7. BMI Counseling - BMI of 41.17 Explained to patient what BMI refers to, and what it means medically.    Told patient to think about it as a "medical risk stratification measurement" and how increasing BMI is associated with increasing risk/ or worsening state of various diseases such as hypertension, hyperlipidemia, diabetes, premature OA, depression etc.  American Heart Association guidelines for healthy diet, basically Mediterranean diet, and exercise guidelines of 30 minutes 5 days per week or more discussed in detail.  Health counseling performed.  All questions answered.  8. Lifestyle & Preventative Health Maintenance - Advised patient to continue working toward exercising to  improve overall mental, physical, and emotional health.    - Encouraged patient to engage in daily physical activity, especially a formal exercise routine.  Recommended that the patient eventually strive for at least 150 minutes of moderate cardiovascular activity per week according to guidelines established by the [facility].   - Healthy dietary habits encouraged,  including low-carb, and high amounts of lean protein in diet.   - Patient should also consume adequate amounts of water.   Education and routine counseling performed. Handouts provided.    Orders Placed This Encounter  Procedures  . POCT UA - Microalbumin  . POCT glycosylated hemoglobin (Hb A1C)       Meds ordered this encounter  Medications  . metFORMIN (GLUCOPHAGE) 500 MG tablet    Sig: Take 1 tablet (500 mg total) by mouth 2 (two) times daily with a meal.    Dispense:  180 tablet    Refill:  3  . Liraglutide -Weight Management (SAXENDA) 18 MG/3ML SOPN    Sig: Inject 3 mg into the skin daily. (dispense enough pens for 90 d supply)    Dispense:  6 pen    Refill:  1  . FLUoxetine (PROZAC) 20 MG capsule    Sig: Take 1 capsule (20 mg total) by mouth daily.    Dispense:  90 capsule    Refill:  0  . escitalopram (LEXAPRO) 20 MG tablet    Sig: Take 0.5 tablets (10 mg total) by mouth daily.    Dispense:  45 tablet    Refill:  0    The patient was counseled, risk factors were discussed, anticipatory guidance given.  Gross side effects, risk and benefits, and alternatives of medications discussed with patient.  Patient is aware that all medications have potential side effects and we are unable to predict every side effect or drug-drug interaction that may occur.  Expresses verbal understanding and consents to current therapy plan and treatment regimen.   Return in about 8 weeks (around 03/27/2019) for 2) 71mo f/up for changes to mood meds, DM meds- restart liraglutide- bring BS log.   Please see AVS handed out to patient at the end of our visit for further patient instructions/ counseling done pertaining to today's office visit.    Note:  This document was prepared using Dragon voice recognition software and may include unintentional dictation errors.   This document serves as a record of services personally performed by [name], DO. It was created on her behalf by  Toni Amend, a trained medical scribe. The creation of this record is based on the scribe's personal observations and the provider's statements to them.   I have reviewed the above medical documentation for accuracy and completeness and I concur.  [name], DO 02/01/2019 5:05 PM        Subjective:    Chief Complaint  Patient presents with  . Follow-up    Rachel Simmons is a 53 y.o. female who presents to [facility] today for Diabetes Management.    Mood Management Mood has been managed on Lexapro.  Patient feels she's been struggling a little bit since her brother-in-law's unexpected death.  Feels her weight's been going crazy, "up and down."  Says "I just do a lot of stuff.  I'm not exercising, and I used to exercise religiously."  Her brother-in-law (her late husband's brother) passed away unexpectedly this past month, and she states she "inherited her sister-in-law."  She's disabled, doesn't work, and  is on disability.  With the assistance of Medicare, patient now takes care of her.    Her husband passed away 7 years ago.  Notes that she is happy to take care of her sister-in-law, because she "knows what it's like to go through losing someone like that," and becomes tearful in office today.  Engages in a reading group at work for self-help.  She eagerly participates in this social support group.  DM HPI: -  She has not been working on diet and exercise for diabetes.    Patient has been through recent acute stress, and she's been missing her workout.  Pt is currently maintained on the following medications for diabetes: See med list today.  Medication compliance - was placed on Liraglutide last year.  Has been off the Liraglutide since November.  Home glucose readings range:  Patient feels her sugars have been a little high, running about 130.  Highest was 146, "that's the highest it's ever been."  Lowest sugar was 50.   Denies  polyuria/polydipsia. Denies hypo/ hyperglycemia symptoms - She denies new onset of: chest pain, exercise intolerance, shortness of breath, dizziness, visual changes, headache, lower extremity swelling or claudication.   Last diabetic eye exam was  Lab Results  Component Value Date   HMDIABEYEEXA No Retinopathy 09/25/2017    Foot exam- UTD  Last A1C in the office was:  Lab Results  Component Value Date   HGBA1C 7.1 (A) 01/30/2019   HGBA1C 6.8 (H) 10/03/2018   HGBA1C 6.3 (A) 08/01/2018    Lab Results  Component Value Date   MICROALBUR 10 01/30/2019   LDLCALC 37 10/03/2018   CREATININE 0.54 (L) 10/03/2018     Last 3 blood pressure readings in our office are as follows: BP Readings from Last 3 Encounters:  01/30/19 118/76  12/18/18 126/72  10/03/18 114/73    BMI Readings from Last 3 Encounters:  01/30/19 41.17 kg/m  12/18/18 40.50 kg/m  10/03/18 40.59 kg/m    Depression screen PHQ 2/9 01/30/2019  Decreased Interest 0  Down, Depressed, Hopeless 0  PHQ - 2 Score 0  Altered sleeping 1  Tired, decreased energy 1  Change in appetite 1  Feeling bad or failure about yourself  0  Trouble concentrating 0  Moving slowly or fidgety/restless 0  Suicidal thoughts 0  PHQ-9 Score 3  Difficult doing work/chores -     No problems updated.    Patient Care Team    Relationship Specialty Notifications Start End  [name], DO PCP - General Family Medicine  08/18/16   Marice Potter, MD Consulting Physician Oncology  08/29/17   Marin Comment, My [place], [place] Referring Physician Optometry  10/10/17      Patient Active Problem List   Diagnosis Date Noted  . Hyperlipidemia associated with type 2 diabetes mellitus ([place]) 08/29/2017    Priority: High  . Obesity, Class III, BMI 40-49.9 (morbid obesity) ([place]) 08/20/2016    Priority: High  . Type II Diabetes mellitus without complication ([place]) 96/78/9381    Priority: High  . Hypertension associated with diabetes ([place]) 08/18/2016      Priority: High  . Thyroid disease 08/18/2016    Priority: High  . Diabetic peripheral neuropathy ([place]) 04/21/2015    Priority: High  . Hypertriglyceridemia 10/18/2016    Priority: Medium  . Insomnia 10/18/2016    Priority: Medium  . 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 Adjustment disorder with mixed anxiety and depressed mood  08/20/2016    Priority: Medium  . Chronic lymphoblastic leukemia 08/18/2016    Priority: Low  . Family history of breast cancer     Priority: Low  . Family history of colon cancer- brother age 25     Priority: Low  . depressed mood, recurrent 01/30/2019  . Flu-like symptoms 12/18/2018  . Myalgias and generalized arthralgias 06/20/2018  . High risk for vaginitis due to Candida secondary to antibiotic use 06/06/2018  . Left tennis elbow 10/10/2017  . Muscle dysfunction/ spasm- upper traps on L 10/10/2017  . Hypothyroidism 08/29/2017  . Family history of colon cancer requiring screening colonoscopy 08/20/2016  . Chronic back pain greater than 3 months duration 08/18/2016  . Genetic testing 05/21/2015  . Family history of polyps in the colon      Past Medical History:  Diagnosis Date  . Chronic lymphoblastic leukemia   . Diabetes mellitus without complication ([place])   . Family history of breast cancer   . Family history of colon cancer   . Family history of polyps in the colon   . Hypertension   . Thyroid disease      Past Surgical History:  Procedure Laterality Date  . ABDOMINAL HYSTERECTOMY  2006   reports ovaries and cervix are intact  . TONSILLECTOMY       Family History  Problem Relation Age of Onset  . Breast cancer Mother        76s; deceased 72  . Colon polyps Mother        Dx 59s; #/type unknown  . Cancer Father        lung cancer; smoker; deceased 37  . Breast cancer Sister 34       triple negative; currently 23  . Cancer Maternal Aunt        lung cancer; smoker; currently 43  . Colon cancer  Maternal Uncle        Dx 74s; currently 77  . Breast cancer Paternal Aunt        Dx 69s; currently 53s  . Cancer Paternal Uncle        liver; heavy drinker  . Cancer Maternal Grandfather        unk. primary; deceased 70s  . Cancer Paternal Grandmother        Dx 59s; unknown primary  . Breast cancer Sister 46       currently 16  . Uterine cancer Sister 19       currently 36  . Cancer Paternal Uncle        lung; heavy smoker  . Breast cancer Cousin        mat first cousin through aunt; poss. breast ca; had mastectomy; currently 50  . Ovarian cancer Other        mat grandmother's sister  . Cancer Other        stomach cancer; mat grandmother's sister  . Colon polyps Brother        age, #, type unknown     Social History   Substance and Sexual Activity  Drug Use No  ,  Social History   Substance and Sexual Activity  Alcohol Use No  ,  Social History   Tobacco Use  Smoking Status Never Smoker  Smokeless Tobacco Never Used  ,    Current Outpatient Medications on File Prior to Visit  Medication Sig Dispense Refill  . aspirin 81 MG chewable tablet Chew 81 mg by mouth daily.    Marland Kitchen atorvastatin (LIPITOR) 20  MG tablet TAKE 1 TABLET AT BEDTIME 90 tablet 1  . calcium carbonate (CALCIUM 600) 600 MG TABS tablet Take 1 tablet by mouth daily.    . cetirizine (ZYRTEC) 5 MG tablet Take 1 tablet by mouth daily.    . Cholecalciferol (VITAMIN D3) 5000 units TABS 5,000 IU OTC vitamin D3 daily. 90 tablet 121  . glucose blood (FREESTYLE LITE) test strip CHECK FASTING BLOOD SUGAR AND 2 HOURS AFTER A MEAL 200 each 3  . levothyroxine (LEVOXYL) 112 MCG tablet Take 1 tablet (112 mcg total) by mouth daily. Patient needs office visit for further refills 90 tablet 0  . lisinopril-hydrochlorothiazide (PRINZIDE,ZESTORETIC) 20-25 MG tablet TAKE 1 TABLET DAILY 90 tablet 1  . Multiple Vitamins-Minerals (ONE-A-DAY WOMENS 50 PLUS PO) Take 1 tablet daily by mouth.    . Omega-3 Fatty Acids (FISH OIL)  1200 MG CAPS Take 4-5 tabs daily    . Vitamin D, Ergocalciferol, (DRISDOL) 1.25 MG (50000 UT) CAPS capsule TAKE 1 CAPSULE EVERY 7 DAYS 12 capsule 4   No current facility-administered medications on file prior to visit.      No Known Allergies   Review of Systems:   General:  Denies fever, chills Optho/Auditory:   Denies visual changes, blurred vision Respiratory:   Denies SOB, cough, wheeze, DIB  Cardiovascular:   Denies chest pain, palpitations, painful respirations Gastrointestinal:   Denies nausea, vomiting, diarrhea.  Endocrine:     Denies new hot or cold intolerance Musculoskeletal:  Denies joint swelling, gait issues, or new unexplained myalgias/ arthralgias Skin:  Denies rash, suspicious lesions  Neurological:    Denies dizziness, unexplained weakness, numbness  Psychiatric/Behavioral:   Denies mood changes    Objective:     Blood pressure 118/76, pulse 69, temperature 97.8 F (36.6 C), height 5' 7.75" (1.721 m), weight 268 lb 12.8 oz (121.9 kg), SpO2 98 %.  Body mass index is 41.17 kg/m.  General:  Tearful during discussion in office today.  Well Developed, well nourished, and in no acute distress.  HEENT: Normocephalic, atraumatic, pupils equal round reactive to light, neck supple, No carotid bruits, no JVD Skin: Warm and dry, cap RF less 2 sec Cardiac: Regular rate and rhythm, S1, S2 WNL's, no murmurs rubs or gallops Respiratory: ECTA B/L, Not using accessory muscles, speaking in full sentences. NeuroM-Sk: Ambulates w/o assistance, moves ext * 4 w/o difficulty, sensation grossly intact.  Ext: scant edema b/l lower ext Psych: No HI/SI, judgement and insight good, Euthymic mood. Full Affect.

## 2019-02-13 ENCOUNTER — Other Ambulatory Visit: Payer: Self-pay

## 2019-02-13 MED ORDER — LIRAGLUTIDE 18 MG/3ML ~~LOC~~ SOPN: 0.6000 mg | PEN_INJECTOR | Freq: Every day | SUBCUTANEOUS | 0 refills | Status: DC

## 2019-03-20 ENCOUNTER — Telehealth: Payer: Self-pay | Admitting: Family Medicine

## 2019-03-20 NOTE — Telephone Encounter (Signed)
Called and spoke to insurance medication to be sent to the patient. MPulliam, CMA/RT(R)

## 2019-03-20 NOTE — Telephone Encounter (Signed)
Patient stated that [employer] still has not received information about new insulin she is supposed to be on, she states that [employer] has sent [place] a request but I advised patient that I don't believe we have received anything like that here at the office. Can we please see why she has not go her new insulin please?

## 2019-04-03 ENCOUNTER — Other Ambulatory Visit: Payer: Self-pay | Admitting: Family Medicine

## 2019-04-03 ENCOUNTER — Ambulatory Visit: Admitting: Family Medicine

## 2019-04-03 DIAGNOSIS — E1159 Type 2 diabetes mellitus with other circulatory complications: Secondary | ICD-10-CM

## 2019-04-03 DIAGNOSIS — I152 Hypertension secondary to endocrine disorders: Secondary | ICD-10-CM

## 2019-04-03 DIAGNOSIS — I10 Essential (primary) hypertension: Principal | ICD-10-CM

## 2019-04-07 ENCOUNTER — Other Ambulatory Visit: Payer: Self-pay | Admitting: Family Medicine

## 2019-04-17 ENCOUNTER — Telehealth: Payer: Self-pay | Admitting: Family Medicine

## 2019-04-17 NOTE — Telephone Encounter (Signed)
-----   Message from Jerilee Field, [place] sent at 04/03/2019 11:58 AM EDT ----- Patient is due for follow up, please call the patient to make an appointment.  Thanks. MPulliam, CMA/RT(R)

## 2019-04-17 NOTE — Telephone Encounter (Signed)
Called patient to set up 3 month f/u-- unable to leave message pt's voicemail box full--will call back later .   --FYI to medical assistant.  --Dion Body

## 2019-07-02 ENCOUNTER — Other Ambulatory Visit: Payer: Self-pay | Admitting: Family Medicine

## 2019-07-02 DIAGNOSIS — E1159 Type 2 diabetes mellitus with other circulatory complications: Secondary | ICD-10-CM

## 2019-07-03 ENCOUNTER — Encounter: Payer: Self-pay | Admitting: Family Medicine

## 2019-07-03 ENCOUNTER — Other Ambulatory Visit: Payer: Self-pay

## 2019-07-03 ENCOUNTER — Ambulatory Visit (INDEPENDENT_AMBULATORY_CARE_PROVIDER_SITE_OTHER): Admitting: Family Medicine

## 2019-07-03 VITALS — BP 108/68 | Temp 98.6°F | Ht 67.75 in | Wt 247.0 lb

## 2019-07-03 DIAGNOSIS — E785 Hyperlipidemia, unspecified: Secondary | ICD-10-CM | POA: Diagnosis not present

## 2019-07-03 DIAGNOSIS — E1159 Type 2 diabetes mellitus with other circulatory complications: Secondary | ICD-10-CM | POA: Diagnosis not present

## 2019-07-03 DIAGNOSIS — I10 Essential (primary) hypertension: Secondary | ICD-10-CM | POA: Diagnosis not present

## 2019-07-03 DIAGNOSIS — I152 Hypertension secondary to endocrine disorders: Secondary | ICD-10-CM

## 2019-07-03 DIAGNOSIS — E039 Hypothyroidism, unspecified: Secondary | ICD-10-CM

## 2019-07-03 DIAGNOSIS — E119 Type 2 diabetes mellitus without complications: Secondary | ICD-10-CM

## 2019-07-03 DIAGNOSIS — E1169 Type 2 diabetes mellitus with other specified complication: Secondary | ICD-10-CM

## 2019-07-03 DIAGNOSIS — E1142 Type 2 diabetes mellitus with diabetic polyneuropathy: Secondary | ICD-10-CM

## 2019-07-03 DIAGNOSIS — E66813 Obesity, class 3: Secondary | ICD-10-CM

## 2019-07-03 DIAGNOSIS — F339 Major depressive disorder, recurrent, unspecified: Secondary | ICD-10-CM

## 2019-07-03 MED ORDER — LISINOPRIL-HYDROCHLOROTHIAZIDE 20-25 MG PO TABS: 1.0000 | ORAL_TABLET | Freq: Every day | ORAL | 0 refills | Status: DC

## 2019-07-03 MED ORDER — ATORVASTATIN CALCIUM 20 MG PO TABS: 20.0000 mg | ORAL_TABLET | Freq: Every day | ORAL | 1 refills | Status: DC

## 2019-07-03 MED ORDER — LEVOTHYROXINE SODIUM 112 MCG PO TABS: 112.0000 ug | ORAL_TABLET | Freq: Every day | ORAL | 1 refills | Status: DC

## 2019-07-03 MED ORDER — METFORMIN HCL 500 MG PO TABS: 250.0000 mg | ORAL_TABLET | Freq: Two times a day (BID) | ORAL | 0 refills | Status: DC

## 2019-07-03 NOTE — Progress Notes (Signed)
Virtual / live video office visit note for [employer], D.O- Primary Care Physician at [facility]   I connected with current patient today and beyond visually recognizing the correct individual, I verified that I am speaking with the correct person using two identifiers.  . Location of the patient: Home . Location of the provider: Office Only the patient (+/- their family members at pt's discretion) and myself were participating in the encounter    - This visit type was conducted due to national recommendations for restrictions regarding the COVID-19 Pandemic (e.g. social distancing) in an effort to limit this patient's exposure and mitigate transmission in our community.  This format is felt to be most appropriate for this patient at this time.   - The patient did have access to video technology today   - No physical exam could be performed with this format, beyond that communicated to [place] by the patient/ family members as noted.   - Additionally my office staff/ schedulers discussed with the patient that there may be a monetary charge related to this service, depending on patient's medical insurance.   The patient expressed understanding, and agreed to proceed.      History of Present Illness:   Mood Patient is taking Prozac only now, 20 mg.  Thyroid Management Patient has not been taking her levothyroxine as prescribed, and has been halving her tablets.  Notes she's "actually feeling great."  Lifestyle & Healthier Habits Says she has gotten down to 247 on her scale, which is a weight loss of 33 lbs.  States she has changed to healthy habits.    States she has been using the [employer] and has been using the assistance of a weight loss program [employer]) that her friend runs.  Notes she completely purged her house of bad things to eat.  Says "Cristie Hem has lost ten lbs and he isn't even on the plan, he's just losing because he's eating what I've been cooking."  She follows a  plan as recommended and eats five small meals per day.  Says there's a huge variety in the meal plans, and "everything I've tried so far is delicious."  Says she's not feeling hungry, "hangry," or irritated.''  Patient has also been walking about two miles every day, and drinks about a gallon and a half of water per day.  DM HPI:  -  She has been working on diet and exercise for diabetes  Says her blood sugar has been excellent, without any spikes.  Medication compliance - Patient is taking 500 mg of metformin twice daily.  Her insurance denied the Liraglutide.  Notes she couldn't obtain it and hasn't taken it at all, and her sugars have all been good.  Home fasting glucose readings: "highest so far" has been 80;  2 hr PP: not checking. States she hasn't been below a 62 on blood sugar.   Denies polyuria/polydipsia.  Denies hypo/ hyperglycemia symptoms  Last diabetic eye exam was  Lab Results  Component Value Date   HMDIABEYEEXA No Retinopathy 09/25/2017    Last A1C in the office was:  Lab Results  Component Value Date   HGBA1C 7.1 (A) 01/30/2019   HGBA1C 6.8 (H) 10/03/2018   HGBA1C 6.3 (A) 08/01/2018    Lab Results  Component Value Date   MICROALBUR 10 01/30/2019   LDLCALC 37 10/03/2018   CREATININE 0.54 (L) 10/03/2018    Wt Readings from Last 3 Encounters:  07/03/19 247 lb (  112 kg)  01/30/19 268 lb 12.8 oz (121.9 kg)  12/18/18 264 lb 6.4 oz (119.9 kg)    BP Readings from Last 3 Encounters:  07/03/19 108/68  01/30/19 118/76  12/18/18 126/72     1. HTN HPI:  -  Her blood pressure has been controlled at home.  Pt is checking it at home.   BP today was 108/68.  Notes "they've just been running so good." Says that her low number has not been over 74 in a month, and her top has been between 104-118.  - Patient reports good compliance with blood pressure medications.  Taking lisinopril HCTZ 20-25 once daily.  - Denies medication S-E   - Smoking Status noted    - She denies new onset of: chest pain, exercise intolerance, shortness of breath, dizziness, visual changes, headache, lower extremity swelling or claudication.   Last 3 blood pressure readings in our office are as follows: BP Readings from Last 3 Encounters:  07/03/19 108/68  01/30/19 118/76  12/18/18 126/72    Filed Weights   07/03/19 1553  Weight: 247 lb (112 kg)    The cholesterol last visit was:  Lab Results  Component Value Date   CHOL 112 10/03/2018   HDL 53 10/03/2018   LDLCALC 37 10/03/2018   TRIG 109 10/03/2018   CHOLHDL 2.1 10/03/2018    Hepatic Function Latest Ref Rng & Units 10/03/2018 08/18/2016  Total Protein 6.0 - 8.5 g/dL 6.2 -  Albumin 3.5 - 5.5 g/dL 3.9 4.2  AST 0 - 40 IU/L 22 23  ALT 0 - 32 IU/L 28 27  Alk Phosphatase 39 - 117 IU/L 65 59  Total Bilirubin 0.0 - 1.2 mg/dL 0.5 -     Impression and Recommendations:    1. Type II Diabetes mellitus without complication ([place])   2. Hypertension associated with diabetes ([place])   3. Hyperlipidemia associated with type 2 diabetes mellitus ([place])   4. Diabetic peripheral neuropathy (HCC)   5. Obesity, Class III, BMI 40-49.9 (morbid obesity) ([place])   6. depressed mood, recurrent   7. Hypothyroidism, unspecified type     - As part of my medical decision making, I reviewed the following data within the [place] History obtained from pt /family, CMA notes reviewed and incorporated if applicable, Labs reviewed, Radiograph/ tests reviewed if applicable and OV notes from prior OV's with me, as well as other specialists she/he has seen since seeing me last, were all reviewed and used in my medical decision making process today.   - Additionally, discussion had with patient regarding txmnt plan, their biases about that plan etc were used in my medical decision making today.   - The patient agreed with the plan and demonstrated an understanding of the instructions.   No barriers to understanding were  identified.   - Red flag symptoms and signs discussed in detail.  Patient expressed understanding regarding what to do in case of emergency_0 urgent symptoms.  The patient was advised to call back or seek an in-person evaluation if the symptoms worsen or if the condition fails to improve as anticipated.  -  Discussed prudent health practices during COVID and ongoing safety.  1. Type II Diabetes mellitus without complication - Last M4B was 7.1 on 01/30/2019. - At that time, told patient to take 500 metformin in the morning and at night, and to resume her Liraglutide.  - Patient has not been taking Liraglutide. - Now that patient is involved in significantly  altering her diet and lifestyle, advised patient to cut her metformin in half (250 twice daily).  - Continue to follow prudent diet and lifestyle habits.  - Counseled patient on pathophysiology of disease and discussed various treatment options, which often includes dietary and lifestyle modifications as first line.  Importance of low carb/high protein/ low calorie diet discussed with patient in addition to regular exercise.   - Check FBS and 2 hours after the biggest meal of your day.  Keep log and bring in next OV for my review.   Also, if you ever feel poorly, please check your blood pressure and blood sugar, as one or the other could be the cause of your symptoms.  - Being a diabetic, you need yearly eye and foot exams. Make appt.for diabetic eye exam.  2. Hypertension Associated with Diabetes - Stable at this time. - BP lower with recent significant weight loss. - As patient continues to lose weight, discussed we will need to reduce her dose.  - Discussed that 110's/70's is acceptable, up to 120/80 or less. - Reviewed that we do not want patient's BP to go too low.  - Told patient that if blood pressure is under 110/70 on a consistent basis, she should cut her blood pressure pill in half.  - Patient tolerating meds well  without S-E.  - Continue ambulatory blood pressure monitoring.  - Will continue to monitor closely.  3. Hyperlipidemia Associated with Diabetes - Re-check in near future. - Continue on fish oil and Lipitor as prescribed.  See med list.  - Patient tolerating well.  Denies S-E on medication. - Will continue to monitor.  4. Seasonal Allergies - Stable at this time. - Continue Zyrtec once daily. - Continue to monitor.  5. Peripheral Neuropathy - Stable at this time. - Per patient, "the swelling is gone" and her legs and feet feel great. - States she has had no pain since the weight loss.  6. Vitamin D Deficiency - Continue Vitamin D supplementation.  7. Acute Stress & Depressed Mood, Recurrent - Last visit, decreased dose of Lexapro to one-half tablet, and began 20 mg Prozac due to fact that anxiety is less of issue now and more depression.  Also, pt wanted more acute onset of med, and not something that would take wks to works ( wellbutrin).  - Continue on Prozac as prescribed.  See med list. - Patient discontinued Lexapro.  - Continue healthy lifestyle habits.  - Will continue to monitor.  8. Elevated TSH Last Check - Thyroid Disease - Patient has only been taking 1/2 tablet of levothyroxine for the past several months.  - Need for re-check near future.  - Will continue to monitor.  9. BMI Counseling - BMI of 37.83, down from 41.17 Explained to patient what BMI refers to, and what it means medically.    Told patient to think about it as a "medical risk stratification measurement" and how increasing BMI is associated with increasing risk/ or worsening state of various diseases such as hypertension, hyperlipidemia, diabetes, premature OA, depression etc.  American Heart Association guidelines for healthy diet, basically Mediterranean diet, and exercise guidelines of 30 minutes 5 days per week or more discussed in detail.  Health counseling performed.  All questions  answered.  - Encouraged ongoing healthy lifestyle habits. - Encouraged patient to continue progress on [place] program prudently.   10. Lifestyle & Preventative Health Maintenance - Advised patient to continue working toward exercising to improve overall mental, physical, and  emotional health.    - Encouraged patient to engage in daily physical activity, especially a formal exercise routine.  Recommended that the patient eventually strive for at least 150 minutes of moderate cardiovascular activity per week according to guidelines established by the [facility].   - Healthy dietary habits encouraged, including low-carb, and high amounts of lean protein in diet.   - Patient should also consume adequate amounts of water.   Education and routine counseling performed. Handouts provided.  - Re-check all labs 2 months from now, and OV 2-3 days later. - Patient knows to get in touch sooner if her blood sugars or blood pressure run too low. - Emphasized critical need to keep track of things closely as she continues to lose weight.  Pt was interviewed and evaluated by me in the clinic today for 32.5+ minutes, with over 50% time spent in face to face counseling of patients various medical conditions, treatment plans of those medical conditions including medicine management and lifestyle modification, strategies to improve health and well being; and in coordination of care. SEE ABOVE TREATMENT PLAN FOR DETAILS   Return for f/up 2 mo for full FBW and then ov 2-3 d later.    No orders of the defined types were placed in this encounter.   Meds ordered this encounter  Medications  . lisinopril-hydrochlorothiazide (ZESTORETIC) 20-25 MG tablet    Sig: Take 1 tablet by mouth daily.    Dispense:  90 tablet    Refill:  0  . metFORMIN (GLUCOPHAGE) 500 MG tablet    Sig: Take 0.5 tablets (250 mg total) by mouth 2 (two) times daily with a meal.    Dispense:  90 tablet    Refill:  0  . levothyroxine  (LEVOXYL) 112 MCG tablet    Sig: Take 1 tablet (112 mcg total) by mouth daily. Patient needs office visit for further refills    Dispense:  90 tablet    Refill:  1  . atorvastatin (LIPITOR) 20 MG tablet    Sig: Take 1 tablet (20 mg total) by mouth at bedtime.    Dispense:  90 tablet    Refill:  1    Medications Discontinued During This Encounter  Medication Reason  . liraglutide (VICTOZA) 18 MG/3ML SOPN   . Liraglutide -Weight Management (SAXENDA) 18 MG/3ML SOPN   . escitalopram (LEXAPRO) 20 MG tablet   . atorvastatin (LIPITOR) 20 MG tablet Reorder  . levothyroxine (LEVOXYL) 112 MCG tablet Reorder  . metFORMIN (GLUCOPHAGE) 500 MG tablet   . lisinopril-hydrochlorothiazide (ZESTORETIC) 20-25 MG tablet Reorder      I provided 32.5+ minutes of face-to-face time during this encounter.   Additional time was spent with charting and coordination of care after the actual visit commenced.   Note:  This note was prepared with assistance of Dragon voice recognition software. Occasional wrong-word or sound-a-like substitutions may have occurred due to the inherent limitations of voice recognition software  This document serves as a record of services personally performed by [name], DO. It was created on her behalf by Toni Amend, a trained medical scribe. The creation of this record is based on the scribe's personal observations and the provider's statements to them.   I have reviewed the above medical documentation for accuracy and completeness and I concur.  [name], DO 07/03/2019 10:21 PM        Patient Care Team    Relationship Specialty Notifications Start End  [name], DO PCP - General Family Medicine  08/18/16   Marice Potter, MD Consulting Physician Oncology  08/29/17   Marin Comment, My [place], [place] Referring Physician Optometry  10/10/17     -Vitals obtained; medications/ allergies reconciled;  personal medical, social, Sx etc.histories were updated by  CMA, reviewed by me and are reflected in chart  Patient Active Problem List   Diagnosis Date Noted  . Hyperlipidemia associated with type 2 diabetes mellitus ([place]) 08/29/2017    Priority: High  . Obesity, Class III, BMI 40-49.9 (morbid obesity) ([place]) 08/20/2016    Priority: High  . Type II Diabetes mellitus without complication ([place]) 87/56/4332    Priority: High  . Hypertension associated with diabetes ([place]) 08/18/2016    Priority: High  . Thyroid disease 08/18/2016    Priority: High  . Diabetic peripheral neuropathy ([place]) 04/21/2015    Priority: High  . Hypertriglyceridemia 10/18/2016    Priority: Medium  . Insomnia 10/18/2016    Priority: Medium  . 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 Adjustment disorder with mixed anxiety and depressed mood 08/20/2016    Priority: Medium  . Chronic lymphoblastic leukemia 08/18/2016    Priority: Low  . Family history of breast cancer     Priority: Low  . Family history of colon cancer- brother age 63     Priority: Low  . depressed mood, recurrent 01/30/2019  . Flu-like symptoms 12/18/2018  . Myalgias and generalized arthralgias 06/20/2018  . High risk for vaginitis due to Candida secondary to antibiotic use 06/06/2018  . Left tennis elbow 10/10/2017  . Muscle dysfunction/ spasm- upper traps on L 10/10/2017  . Hypothyroidism 08/29/2017  . Family history of colon cancer requiring screening colonoscopy 08/20/2016  . Chronic back pain greater than 3 months duration 08/18/2016  . Genetic testing 05/21/2015  . Family history of polyps in the colon      Current Meds  Medication Sig  . aspirin 81 MG chewable tablet Chew 81 mg by mouth daily.  Marland Kitchen atorvastatin (LIPITOR) 20 MG tablet Take 1 tablet (20 mg total) by mouth at bedtime.  . calcium carbonate (CALCIUM 600) 600 MG TABS tablet Take 1 tablet by mouth daily.  . cetirizine (ZYRTEC) 5 MG tablet Take 1 tablet by mouth daily.  . Cholecalciferol (VITAMIN D3) 5000  units TABS 5,000 IU OTC vitamin D3 daily.  Marland Kitchen FLUoxetine (PROZAC) 20 MG capsule TAKE 1 CAPSULE DAILY  . glucose blood (FREESTYLE LITE) test strip CHECK FASTING BLOOD SUGAR AND 2 HOURS AFTER A MEAL  . levothyroxine (LEVOXYL) 112 MCG tablet Take 1 tablet (112 mcg total) by mouth daily. Patient needs office visit for further refills  . lisinopril-hydrochlorothiazide (ZESTORETIC) 20-25 MG tablet Take 1 tablet by mouth daily.  . metFORMIN (GLUCOPHAGE) 500 MG tablet Take 0.5 tablets (250 mg total) by mouth 2 (two) times daily with a meal.  . Multiple Vitamins-Minerals (ONE-A-DAY WOMENS 50 PLUS PO) Take 1 tablet daily by mouth.  . Omega-3 Fatty Acids (FISH OIL) 1200 MG CAPS Take 4-5 tabs daily  . Vitamin D, Ergocalciferol, (DRISDOL) 1.25 MG (50000 UT) CAPS capsule TAKE 1 CAPSULE EVERY 7 DAYS  . [DISCONTINUED] atorvastatin (LIPITOR) 20 MG tablet TAKE 1 TABLET AT BEDTIME  . [DISCONTINUED] levothyroxine (LEVOXYL) 112 MCG tablet Take 1 tablet (112 mcg total) by mouth daily. Patient needs office visit for further refills  . [DISCONTINUED] lisinopril-hydrochlorothiazide (ZESTORETIC) 20-25 MG tablet TAKE 1 TABLET DAILY  . [DISCONTINUED] metFORMIN (GLUCOPHAGE) 500 MG tablet Take 1 tablet (500 mg total) by  mouth 2 (two) times daily with a meal.     No Known Allergies   ROS:  See above HPI for pertinent positives and negatives   Objective:   Blood pressure 108/68, temperature 98.6 F (37 C), height 5' 7.75" (1.721 m), weight 247 lb (112 kg).  (if some vitals are omitted, this means that patient was UNABLE to obtain them even though they were asked to get them prior to OV today.  They were asked to call us at their earliest convenience with these once obtained.)  General: A & O * 3; visually in no acute distress; in usual state of health.  Skin: Visible skin appears normal and pt's usual skin color HEENT:  EOMI, head is normocephalic and atraumatic.  Sclera are anicteric. Neck has a good range of motion.   Lips are noncyanotic Chest: normal chest excursion and movement Respiratory: speaking in full sentences, no conversational dyspnea; no use of accessory muscles Psych: insight good, mood- appears full

## 2019-07-09 IMAGING — MG DIGITAL SCREENING BILATERAL MAMMOGRAM WITH CAD
2 series · 2 of 2 positions shown · non-contrast
Comparison: Previous exam(s).

CLINICAL DATA: Screening.

EXAM:
DIGITAL SCREENING BILATERAL MAMMOGRAM WITH CAD

[L MLO]
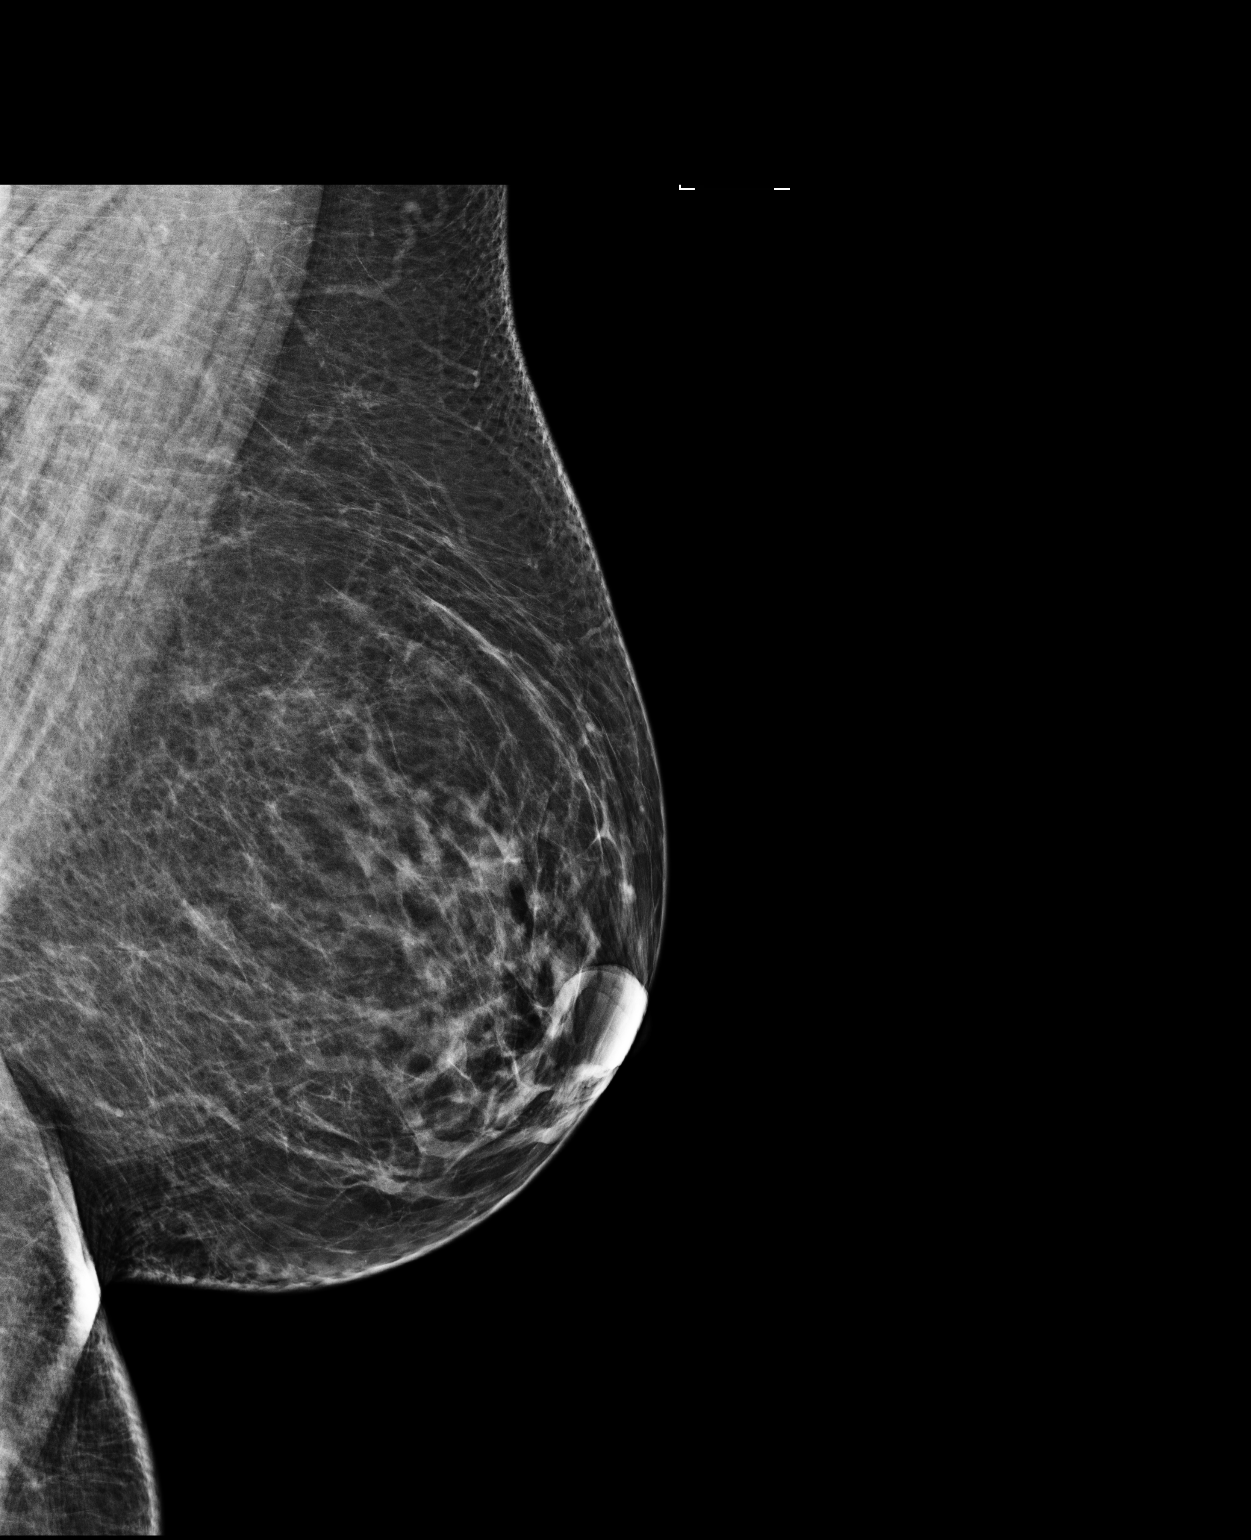

[R CC]
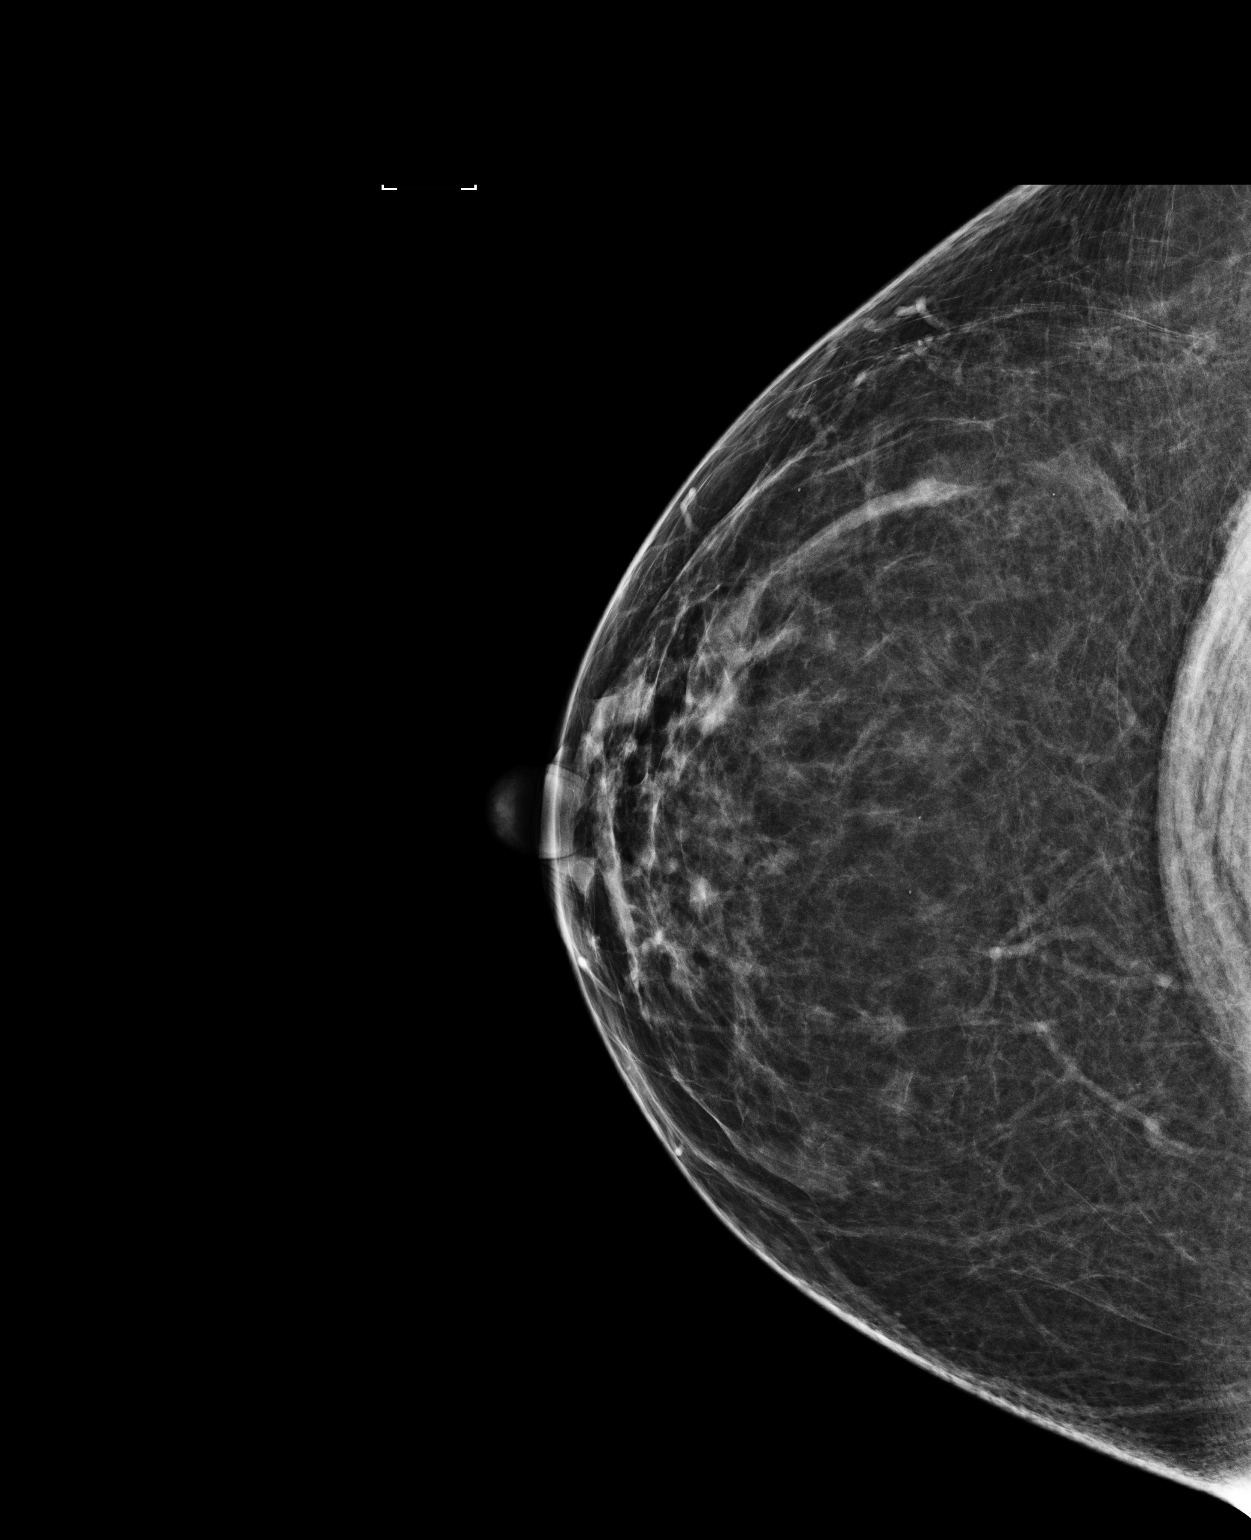

[2 of 2 positions shown; findings below may reference images not displayed]

ACR Breast Density Category b: There are scattered areas of
fibroglandular density.
FINDINGS: There are no findings suspicious for malignancy. Images were
processed with CAD.
IMPRESSION: No mammographic evidence of malignancy. A result letter of this
screening mammogram will be mailed directly to the patient.

RECOMMENDATION:
Screening mammogram in one year. (Code:AS-G-LCT)

BI-RADS CATEGORY  1: Negative.

## 2019-08-01 ENCOUNTER — Telehealth: Payer: Self-pay | Admitting: Family Medicine

## 2019-08-01 NOTE — Telephone Encounter (Signed)
LVM for pt to return call to obtain additional information.  What medication is she referring to that causes sunburns?  Charyl Bigger, CMA

## 2019-08-01 NOTE — Telephone Encounter (Signed)
Patient request letter to Employer stating she needs to stay out of the sun due to Rx precautions/side effects sunburns severely.  --Forwarding request to medical assistant for review.  -glh

## 2019-08-05 NOTE — Telephone Encounter (Signed)
Sent MyChart message to pt requesting additional information.  Will await pt's reply.  Charyl Bigger, CMA

## 2019-08-08 ENCOUNTER — Telehealth: Payer: Self-pay | Admitting: Family Medicine

## 2019-08-08 NOTE — Telephone Encounter (Signed)
Patient returned medical assistant's call stating the direction on her Lisinopril bottle states she should avoid Direct sunlight for any prolong periods of time-----   due to the employer/ [employer] occupancy restrictions / some employee are having  to work outdoors for long periods of time & she personally is having some reactions to being in sun while @ work & taking prescribed medication & request provider write a Work restriction letter to employer .  --forwarding to medical asst.  --glh

## 2019-08-08 NOTE — Telephone Encounter (Signed)
I am not aware of what conversation medical assistant had with patient regarding sunlight and lisinopril. -Lisinopril does not cause sun reaction.  I advised patient to use sunscreen, also she can purchase SPF clothing which works really well also

## 2019-09-11 ENCOUNTER — Other Ambulatory Visit

## 2019-09-11 ENCOUNTER — Other Ambulatory Visit: Payer: Self-pay

## 2019-09-11 DIAGNOSIS — E1159 Type 2 diabetes mellitus with other circulatory complications: Secondary | ICD-10-CM

## 2019-09-11 DIAGNOSIS — E079 Disorder of thyroid, unspecified: Secondary | ICD-10-CM

## 2019-09-11 DIAGNOSIS — E039 Hypothyroidism, unspecified: Secondary | ICD-10-CM

## 2019-09-11 DIAGNOSIS — E119 Type 2 diabetes mellitus without complications: Secondary | ICD-10-CM

## 2019-09-11 DIAGNOSIS — E1169 Type 2 diabetes mellitus with other specified complication: Secondary | ICD-10-CM

## 2019-09-12 LAB — CBC WITH DIFFERENTIAL/PLATELET
Basophils Absolute: 0.2 10*3/uL (ref 0.0–0.2)
Basos: 1 %
EOS (ABSOLUTE): 0.4 10*3/uL (ref 0.0–0.4)
Eos: 2 %
Hematocrit: 37.2 % (ref 34.0–46.6)
Hemoglobin: 12.5 g/dL (ref 11.1–15.9)
Immature Grans (Abs): 0 10*3/uL (ref 0.0–0.1)
Immature Granulocytes: 0 %
Lymphocytes Absolute: 16 10*3/uL — ABNORMAL HIGH (ref 0.7–3.1)
Lymphs: 75 %
MCH: 28.2 pg (ref 26.6–33.0)
MCHC: 33.6 g/dL (ref 31.5–35.7)
MCV: 84 fL (ref 79–97)
Monocytes Absolute: 0.8 10*3/uL (ref 0.1–0.9)
Monocytes: 4 %
Neutrophils Absolute: 3.7 10*3/uL (ref 1.4–7.0)
Neutrophils: 18 %
Platelets: 260 10*3/uL (ref 150–450)
RBC: 4.43 x10E6/uL (ref 3.77–5.28)
RDW: 13 % (ref 11.7–15.4)
WBC: 21.2 10*3/uL (ref 3.4–10.8)

## 2019-09-12 LAB — COMPREHENSIVE METABOLIC PANEL
ALT: 22 IU/L (ref 0–32)
AST: 20 IU/L (ref 0–40)
Albumin/Globulin Ratio: 2 (ref 1.2–2.2)
Albumin: 4.3 g/dL (ref 3.8–4.9)
Alkaline Phosphatase: 65 IU/L (ref 39–117)
BUN/Creatinine Ratio: 28 — ABNORMAL HIGH (ref 9–23)
BUN: 17 mg/dL (ref 6–24)
Bilirubin Total: 0.4 mg/dL (ref 0.0–1.2)
CO2: 27 mmol/L (ref 20–29)
Calcium: 10.1 mg/dL (ref 8.7–10.2)
Chloride: 101 mmol/L (ref 96–106)
Creatinine, Ser: 0.61 mg/dL (ref 0.57–1.00)
GFR calc Af Amer: 121 mL/min/{1.73_m2} (ref >59)
GFR calc non Af Amer: 105 mL/min/{1.73_m2} (ref >59)
Globulin, Total: 2.2 g/dL (ref 1.5–4.5)
Glucose: 91 mg/dL (ref 65–99)
Potassium: 4.3 mmol/L (ref 3.5–5.2)
Sodium: 139 mmol/L (ref 134–144)
Total Protein: 6.5 g/dL (ref 6.0–8.5)

## 2019-09-12 LAB — LIPID PANEL
Chol/HDL Ratio: 1.6 ratio (ref 0.0–4.4)
Cholesterol, Total: 94 mg/dL — ABNORMAL LOW (ref 100–199)
HDL: 57 mg/dL (ref >39)
LDL Chol Calc (NIH): 19 mg/dL (ref 0–99)
Triglycerides: 91 mg/dL (ref 0–149)
VLDL Cholesterol Cal: 18 mg/dL (ref 5–40)

## 2019-09-12 LAB — VITAMIN D 25 HYDROXY (VIT D DEFICIENCY, FRACTURES): Vit D, 25-Hydroxy: 76.6 ng/mL (ref 30.0–100.0)

## 2019-09-12 LAB — T3: T3, Total: 108 ng/dL (ref 71–180)

## 2019-09-12 LAB — T4, FREE: Free T4: 1.51 ng/dL (ref 0.82–1.77)

## 2019-09-12 LAB — HEMOGLOBIN A1C
Est. average glucose Bld gHb Est-mCnc: 117 mg/dL
Hgb A1c MFr Bld: 5.7 % — ABNORMAL HIGH (ref 4.8–5.6)

## 2019-09-12 LAB — TSH: TSH: 1.93 u[IU]/mL (ref 0.450–4.500)

## 2019-09-15 ENCOUNTER — Encounter: Payer: Self-pay | Admitting: Family Medicine

## 2019-09-17 ENCOUNTER — Other Ambulatory Visit: Payer: Self-pay | Admitting: Family Medicine

## 2019-09-17 DIAGNOSIS — E119 Type 2 diabetes mellitus without complications: Secondary | ICD-10-CM

## 2019-09-18 ENCOUNTER — Other Ambulatory Visit: Payer: Self-pay

## 2019-09-18 ENCOUNTER — Encounter: Payer: Self-pay | Admitting: Family Medicine

## 2019-09-18 ENCOUNTER — Ambulatory Visit (INDEPENDENT_AMBULATORY_CARE_PROVIDER_SITE_OTHER): Admitting: Family Medicine

## 2019-09-18 VITALS — BP 113/77 | HR 67 | Temp 98.0°F | Resp 16 | Ht 69.5 in | Wt 230.5 lb

## 2019-09-18 DIAGNOSIS — C9110 Chronic lymphocytic leukemia of B-cell type not having achieved remission: Secondary | ICD-10-CM

## 2019-09-18 DIAGNOSIS — F339 Major depressive disorder, recurrent, unspecified: Secondary | ICD-10-CM

## 2019-09-18 DIAGNOSIS — I10 Essential (primary) hypertension: Secondary | ICD-10-CM

## 2019-09-18 DIAGNOSIS — E1159 Type 2 diabetes mellitus with other circulatory complications: Secondary | ICD-10-CM | POA: Diagnosis not present

## 2019-09-18 DIAGNOSIS — E079 Disorder of thyroid, unspecified: Secondary | ICD-10-CM

## 2019-09-18 DIAGNOSIS — E785 Hyperlipidemia, unspecified: Secondary | ICD-10-CM

## 2019-09-18 DIAGNOSIS — E119 Type 2 diabetes mellitus without complications: Secondary | ICD-10-CM

## 2019-09-18 DIAGNOSIS — F4323 Adjustment disorder with mixed anxiety and depressed mood: Secondary | ICD-10-CM

## 2019-09-18 DIAGNOSIS — Z23 Encounter for immunization: Secondary | ICD-10-CM | POA: Diagnosis not present

## 2019-09-18 DIAGNOSIS — E781 Pure hyperglyceridemia: Secondary | ICD-10-CM

## 2019-09-18 DIAGNOSIS — E1169 Type 2 diabetes mellitus with other specified complication: Secondary | ICD-10-CM

## 2019-09-18 LAB — HM DIABETES EYE EXAM

## 2019-09-18 MED ORDER — ATORVASTATIN CALCIUM 20 MG PO TABS: 10.0000 mg | ORAL_TABLET | Freq: Every day | ORAL | 1 refills | Status: DC

## 2019-09-18 NOTE — Progress Notes (Signed)
Assessment and plan:  1. Obesity, Class III, BMI 40-49.9 (morbid obesity) ([place])   2. CLL (chronic lymphocytic leukemia) ([place])   3. Type II Diabetes mellitus without complication ([place])   4. Hypertension associated with diabetes ([place])   5. Hyperlipidemia associated with type 2 diabetes mellitus ([place])   6. Thyroid disease   7. Hypertriglyceridemia   8. Adjustment disorder with mixed anxiety and depressed mood   9. depressed mood, recurrent   10. Need for immunization against influenza     - Reviewed recent lab work (09/12/2019) in depth with patient today.  All lab work within normal limits unless otherwise noted.  Extensive education provided and all questions answered.  - Discussed kidney function with patient during lab review. - Advised adequate hydration and physical activity to help preserve organ health.  Elevated WBC, Lymphocytes - h/o CLL - WBC elevated at 21.2, up from 19.0 and 19.4 prior, but remains stable.  - Lymphocytes elevated at 16.0, up from 13.6 eleven months ago, overall stable. - Given patient's history of CLL, advised patient to touch base with oncology again. - Patient agrees to call oncology for follow-up. - Will continue to monitor.  Vitamin D Deficiency - Up to 76.6 from 49.5 prior. - Discussed that as patient loses weight, Vitamin D levels will go up. - Reviewed supplementation with patient during appointment. - Continue supplementation as discussed. - Will continue to monitor.  Diet Controlled Diabetes Mellitus - A1c down to 5.7 from 7.1 seven months ago. - Advised patient to prudently reduce dose of metformin daily. - As patient continues to lose weight, reduce to half tablet of metformin 500 mg twice daily in the future if pt notices her sugars are going lower.  - Patient understands need to prudently adjust her treatment plan to avoid low blood sugars. - Will continue to monitor.   Hyperlipidemia associated with DM, Hypertriglyceridemia Triglycerides = 91, WNL, down from 109 prior. HDL = 57, up from 53 prior. LDL = 19, down from 37 prior.  - Dose of Lipitor changed today. - Take 1/2 tablet of Lipitor nightly.  See med list. - Advised patient to take appropriate dose of fish oil daily.  - As patient continues to lose weight, we will re-check PRN. - Will continue to monitor.  Hypertension associated with DM - As patient continues to lose weight, discussed reducing dose of medication. - Advised patient to check her blood pressure daily. - If her BP is running low, around 100/50-60, advised patient to cut her BP medication in half.  - Patient understands need to prudently cut medication in half to prevent abnormally low BP. - Will continue to monitor.  Adjustment Disorder, Recurrent Depressed Mood - Stable at this time on current management.  Continue as prescribed. - No changes made to treatment plan today.  See med list.  - In addition to prescription intervention, reviewed the "spokes of the wheel" of mood and health management.  Stressed the importance of ongoing prudent habits, including regular exercise, appropriate sleep hygiene, healthful dietary habits, and prayer/meditation to relax.  - Will continue to monitor.  Thyroid Disease - No changes made to management today. - Continue treatment plan as established. - Will continue to monitor.  BMI Counseling - Obesity, Body mass index is 33.55 kg/m Explained to patient what BMI refers to, and what it means medically.    Told patient to think about it as a "medical risk stratification measurement" and how increasing BMI  is associated with increasing risk/ or worsening state of various diseases such as hypertension, hyperlipidemia, diabetes, premature OA, depression etc.  American Heart Association guidelines for healthy diet, basically Mediterranean diet, and exercise guidelines of 30 minutes 5 days per  week or more discussed in detail.  Health counseling performed.  All questions answered.  - Per patient, has lost 60 lbs since July.  - Discussed that 2 lbs of weight loss per week is more than enough.  - Advised patient that body will hit a plateau at some point and remain there for several months.  When patient does hit this plateau, advised her to stick with the program until the weight loss resumes again.  - Will continue to monitor.  Lifestyle & Preventative Health Maintenance - Advised patient to continue working toward exercising to improve overall mental, physical, and emotional health.    - Encouraged patient to engage in daily physical activity, especially a formal exercise routine.  Recommended that the patient eventually strive for at least 150 minutes of moderate cardiovascular activity per week according to guidelines established by the [facility].   - Healthy dietary habits encouraged, including low-carb, and high amounts of lean protein in diet.   - Patient should also consume adequate amounts of water.   Education and routine counseling performed. Handouts provided.  Recommendations - Return in 3 months for re-check as recommended.   Orders Placed This Encounter  Procedures   Flu Vaccine QUAD 36+ mos IM    Meds ordered this encounter  Medications   atorvastatin (LIPITOR) 20 MG tablet    Sig: Take 0.5 tablets (10 mg total) by mouth at bedtime.    Dispense:  45 tablet    Refill:  1    Medications Discontinued During This Encounter  Medication Reason   Cholecalciferol (VITAMIN D3) 5000 units TABS    atorvastatin (LIPITOR) 20 MG tablet       Return for f/up in 79mo for wt loss, BP, DM, chol etc;  FLP, A1c, Vit D 3 d prior.   Anticipatory guidance and routine counseling done re: condition, txmnt options and need for follow up. All questions of patient's were answered.   Gross side effects, risk and benefits, and alternatives of medications discussed with  patient.  Patient is aware that all medications have potential side effects and we are unable to predict every sideeffect or drug-drug interaction that may occur.  Expresses verbal understanding and consents to current therapy plan and treatment regiment.  Please see AVS handed out to patient at the end of our visit for additional patient instructions/ counseling done pertaining to today's office visit.  Note:  This document was prepared using Dragon voice recognition software and may include unintentional dictation errors.  This document serves as a record of services personally performed by [name], DO. It was created on her behalf by Toni Amend, a trained medical scribe. The creation of this record is based on the scribe's personal observations and the provider's statements to them.   I have reviewed the above medical documentation for accuracy and completeness and I concur.  [name], DO 09/21/2019 2:11 PM       ----------------------------------------------------------------------------------------------------------------------  Subjective:   CC:   Rachel Simmons is a 53 y.o. female who presents to [place] at [facility] today for review and discussion of recent bloodwork that was done in addition to f/up on chronic conditions we are managing for pt.  1. All recent blood work that  we ordered was reviewed with patient today.  Patient was counseled on all abnormalities and we discussed dietary and lifestyle changes that could help those values (also medications when appropriate).  Extensive health counseling performed and all patient's concerns/ questions were addressed.  See labs below and also plan for more details of these abnormalities  - Diabetes Mellitus Denies low blood sugar.  Notes "eating something every 2.5 hours, and between that, I drink about a gallon and a half of water per day."  - Blood Pressure Stable at home.  -  Mood Notes her mood is good and stable.  - Weight Loss Notes she's lost weight; weighed 222 at home.  Her goal is 199 lbs.  She started the current program ([employer]) in July, and has lost 60 lbs since.  Started at 282 lbs.  Says that the program is fabulous and she feels so happy about it.  She has been losing 10 lbs per month because "I don't want to kill myself and I don't want to be discouraged."  - Energy Feels her energy has improved since the weight loss.  Notes got up Tuesday morning to shower, get ready for work, feed the dogs, etc., and ended up being too early to work.  Says she has so much energy all the time now.  Notes "I don't feel tired; I sleep when I lay down to sleep, it's good.  A lot of the aches and pains are gone; I got on my roof to fix the roof the other day."  - Physical Activity Says that the [place] program doesn't recommend exercise at the beginning because the body is in "starvation mode," but as you get comfortable with the program, they recommend you start exercising.  Patient walks a lot.  Notes she's constantly moving wherever she is, and at home she does three miles of walking.   Wt Readings from Last 3 Encounters:  09/18/19 230 lb 8 oz (104.6 kg)  07/03/19 247 lb (112 kg)  01/30/19 268 lb 12.8 oz (121.9 kg)   BP Readings from Last 3 Encounters:  09/18/19 113/77  07/03/19 108/68  01/30/19 118/76   Pulse Readings from Last 3 Encounters:  09/18/19 67  01/30/19 69  12/18/18 100   BMI Readings from Last 3 Encounters:  09/18/19 33.55 kg/m  07/03/19 37.83 kg/m  01/30/19 41.17 kg/m     Patient Care Team    Relationship Specialty Notifications Start End  [name], DO PCP - General Family Medicine  08/18/16   Marice Potter, MD Consulting Physician Oncology  08/29/17   Marin Comment, My [place], [place] Referring Physician Optometry  10/10/17   Annia Belt, MD Referring Physician Internal Medicine  09/15/19    Comment: ONcologist she see's for her CLL     Full medical history updated and reviewed in the office today  Patient Active Problem List   Diagnosis Date Noted   Hyperlipidemia associated with type 2 diabetes mellitus ([place]) 08/29/2017    Priority: High   Obesity, Class III, BMI 40-49.9 (morbid obesity) ([place]) 08/20/2016    Priority: High   CLL (chronic lymphocytic leukemia) ([place]) 08/18/2016    Priority: High   Type II Diabetes mellitus without complication ([place]) [id number]    Priority: High   Hypertension associated with diabetes ([place]) 08/18/2016    Priority: High   Thyroid disease 08/18/2016    Priority: High   Diabetic peripheral neuropathy ([place]) 04/21/2015    Priority: High   Hypertriglyceridemia 10/18/2016    Priority:  Medium   Insomnia 10/18/2016    Priority: Medium   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 10/18/2016    Priority: Medium   Adjustment disorder with mixed anxiety and depressed mood 08/20/2016    Priority: Medium   Family history of breast cancer     Priority: Low   Family history of colon cancer- brother age 83     Priority: Low   depressed mood, recurrent 01/30/2019   Flu-like symptoms 12/18/2018   Myalgias and generalized arthralgias 06/20/2018   High risk for vaginitis due to Candida secondary to antibiotic use 06/06/2018   Left tennis elbow 10/10/2017   Muscle dysfunction/ spasm- upper traps on L 10/10/2017   Hypothyroidism 08/29/2017   Family history of colon cancer requiring screening colonoscopy 08/20/2016   Chronic back pain greater than 3 months duration 08/18/2016   Genetic testing 05/21/2015   Family history of polyps in the colon     Past Medical History:  Diagnosis Date   Chronic lymphoblastic leukemia    Diabetes mellitus without complication ([place])    Family history of breast cancer    Family history of colon cancer    Family history of polyps in the colon    Hypertension    Thyroid disease     Past Surgical History:  Procedure Laterality Date    ABDOMINAL HYSTERECTOMY  2006   reports ovaries and cervix are intact   TONSILLECTOMY      Social History   Tobacco Use   Smoking status: Never Smoker   Smokeless tobacco: Never Used  Substance Use Topics   Alcohol use: No    Family Hx: Family History  Problem Relation Age of Onset   Breast cancer Mother        52s; deceased 85   Colon polyps Mother        Dx 33s; #/type unknown   Cancer Father        lung cancer; smoker; deceased 56   Breast cancer Sister 24       triple negative; currently 44   Cancer Maternal Aunt        lung cancer; smoker; currently 44   Colon cancer Maternal Uncle        Dx 51s; currently 60   Breast cancer Paternal Aunt        Dx 55s; currently 10s   Cancer Paternal Uncle        liver; heavy drinker   Cancer Maternal Grandfather        unk. primary; deceased 75s   Cancer Paternal Grandmother        Dx 11s; unknown primary   Breast cancer Sister 29       currently 69   Uterine cancer Sister 24       currently 61   Cancer Paternal Uncle        lung; heavy smoker   Breast cancer Cousin        mat first cousin through aunt; poss. breast ca; had mastectomy; currently 50   Ovarian cancer Other        mat grandmother's sister   Cancer Other        stomach cancer; mat grandmother's sister   Colon polyps Brother        age, #, type unknown     Medications: Current Outpatient Medications  Medication Sig Dispense Refill   aspirin 81 MG chewable tablet Chew 81 mg by mouth daily.     atorvastatin (LIPITOR) 20 MG tablet Take 0.5  tablets (10 mg total) by mouth at bedtime. 45 tablet 1   calcium carbonate (CALCIUM 600) 600 MG TABS tablet Take 1 tablet by mouth daily.     cetirizine (ZYRTEC) 5 MG tablet Take 1 tablet by mouth daily.     FLUoxetine (PROZAC) 20 MG capsule TAKE 1 CAPSULE DAILY 90 capsule 0   glucose blood (FREESTYLE LITE) test strip CHECK FASTING BLOOD SUGAR AND 2 HOURS AFTER A MEAL 200 each 3    levothyroxine (LEVOXYL) 112 MCG tablet Take 1 tablet (112 mcg total) by mouth daily. Patient needs office visit for further refills 90 tablet 1   lisinopril-hydrochlorothiazide (ZESTORETIC) 20-25 MG tablet Take 1 tablet by mouth daily. 90 tablet 0   metFORMIN (GLUCOPHAGE) 500 MG tablet Take 0.5 tablets (250 mg total) by mouth 2 (two) times daily with a meal. 90 tablet 0   Multiple Vitamins-Minerals (ONE-A-DAY WOMENS 50 PLUS PO) Take 1 tablet daily by mouth.     Omega-3 Fatty Acids (FISH OIL) 1200 MG CAPS Take 4-5 tabs daily     Vitamin D, Ergocalciferol, (DRISDOL) 1.25 MG (50000 UT) CAPS capsule TAKE 1 CAPSULE EVERY 7 DAYS 12 capsule 4   No current facility-administered medications for this visit.     Allergies:  No Known Allergies   Review of Systems: General:   No F/C, wt loss Pulm:   No DIB, SOB, pleuritic chest pain Card:  No CP, palpitations Abd:  No n/v/d or pain Ext:  No inc edema from baseline  Objective:  Blood pressure 113/77, pulse 67, temperature 98 F (36.7 C), resp. rate 16, height 5' 9.5" (1.765 m), weight 230 lb 8 oz (104.6 kg), SpO2 99 %. Body mass index is 33.55 kg/m. Gen:   Well NAD, A and O *3 HEENT:    [place]/AT, EOMI,  MMM Lungs:   Normal work of breathing. CTA B/L, no Wh, rhonchi Heart:   RRR, S1, S2 WNL's, no MRG Abd:   No gross distention Exts:    warm, pink,  Brisk capillary refill, warm and well perfused.  Psych:    No HI/SI, judgement and insight good, Euthymic mood. Full Affect.   Recent Results (from the past 2160 hour(s))  CBC with Differential/Platelet     Status: Abnormal   Collection Time: 09/11/19  8:42 AM  Result Value Ref Range   WBC 21.2 (HH) 3.4 - 10.8 x10E3/uL   RBC 4.43 3.77 - 5.28 x10E6/uL   Hemoglobin 12.5 11.1 - 15.9 g/dL   Hematocrit 37.2 34.0 - 46.6 %   MCV 84 79 - 97 fL   MCH 28.2 26.6 - 33.0 pg   MCHC 33.6 31.5 - 35.7 g/dL   RDW 13.0 11.7 - 15.4 %   Platelets 260 150 - 450 x10E3/uL   Neutrophils 18 Not Estab. %   Lymphs  75 Not Estab. %    Comment: Smudge cells present Atypical lymphocytes.    Monocytes 4 Not Estab. %   Eos 2 Not Estab. %   Basos 1 Not Estab. %   Neutrophils Absolute 3.7 1.4 - 7.0 x10E3/uL   Lymphocytes Absolute 16.0 (H) 0.7 - 3.1 x10E3/uL   Monocytes Absolute 0.8 0.1 - 0.9 x10E3/uL   EOS (ABSOLUTE) 0.4 0.0 - 0.4 x10E3/uL   Basophils Absolute 0.2 0.0 - 0.2 x10E3/uL   Immature Granulocytes 0 Not Estab. %   Immature Grans (Abs) 0.0 0.0 - 0.1 x10E3/uL   Hematology Comments: Note:     Comment: Verified by microscopic examination.  Comprehensive metabolic  panel     Status: Abnormal   Collection Time: 09/11/19  8:42 AM  Result Value Ref Range   Glucose 91 65 - 99 mg/dL   BUN 17 6 - 24 mg/dL   Creatinine, Ser 0.61 0.57 - 1.00 mg/dL   GFR calc non Af Amer 105 >59 mL/min/1.73   GFR calc Af Amer 121 >59 mL/min/1.73   BUN/Creatinine Ratio 28 (H) 9 - 23   Sodium 139 134 - 144 mmol/L   Potassium 4.3 3.5 - 5.2 mmol/L   Chloride 101 96 - 106 mmol/L   CO2 27 20 - 29 mmol/L   Calcium 10.1 8.7 - 10.2 mg/dL   Total Protein 6.5 6.0 - 8.5 g/dL   Albumin 4.3 3.8 - 4.9 g/dL   Globulin, Total 2.2 1.5 - 4.5 g/dL   Albumin/Globulin Ratio 2.0 1.2 - 2.2   Bilirubin Total 0.4 0.0 - 1.2 mg/dL   Alkaline Phosphatase 65 39 - 117 IU/L   AST 20 0 - 40 IU/L   ALT 22 0 - 32 IU/L  Hemoglobin A1c     Status: Abnormal   Collection Time: 09/11/19  8:42 AM  Result Value Ref Range   Hgb A1c MFr Bld 5.7 (H) 4.8 - 5.6 %    Comment:          Prediabetes: 5.7 - 6.4          Diabetes: >6.4          Glycemic control for adults with diabetes: <7.0    Est. average glucose Bld gHb Est-mCnc 117 mg/dL  Lipid panel     Status: Abnormal   Collection Time: 09/11/19  8:42 AM  Result Value Ref Range   Cholesterol, Total 94 (L) 100 - 199 mg/dL   Triglycerides 91 0 - 149 mg/dL   HDL 57 >39 mg/dL   VLDL Cholesterol Cal 18 5 - 40 mg/dL   LDL Chol Calc (NIH) 19 0 - 99 mg/dL   Chol/HDL Ratio 1.6 0.0 - 4.4 ratio    Comment:                                    T. Chol/HDL Ratio                                             Men  Women                               1/2 Avg.Risk  3.4    3.3                                   Avg.Risk  5.0    4.4                                2X Avg.Risk  9.6    7.1                                3X Avg.Risk 23.4   11.0   T3     Status: None  Collection Time: 09/11/19  8:42 AM  Result Value Ref Range   T3, Total 108 71 - 180 ng/dL  T4, free     Status: None   Collection Time: 09/11/19  8:42 AM  Result Value Ref Range   Free T4 1.51 0.82 - 1.77 ng/dL  TSH     Status: None   Collection Time: 09/11/19  8:42 AM  Result Value Ref Range   TSH 1.930 0.450 - 4.500 uIU/mL  VITAMIN D 25 Hydroxy (Vit-D Deficiency, Fractures)     Status: None   Collection Time: 09/11/19  8:42 AM  Result Value Ref Range   Vit D, 25-Hydroxy 76.6 30.0 - 100.0 ng/mL    Comment: Vitamin D deficiency has been defined by the [place] and an Endocrine Society practice guideline as a level of serum 25-OH vitamin D less than 20 ng/mL (1,2). The Endocrine Society went on to further define vitamin D insufficiency as a level between 21 and 29 ng/mL (2). 1. IOM (Institute of Medicine). 2010. Dietary reference    intakes for calcium and D. [place]: The    [employer]. 2. Holick MF, Binkley , Bischoff-Ferrari HA, et al.    Evaluation, treatment, and prevention of vitamin D    deficiency: an Endocrine Society clinical practice    guideline. JCEM. 2011 Jul; 96(7):1911-30.

## 2019-09-18 NOTE — Patient Instructions (Signed)
As you continue to lose weight, reduce to half tablet of metformin 500 mg twice daily in the future, if you are noticing your blood sugars going lower.

## 2019-10-02 ENCOUNTER — Other Ambulatory Visit: Payer: Self-pay | Admitting: Family Medicine

## 2019-10-02 DIAGNOSIS — Z1231 Encounter for screening mammogram for malignant neoplasm of breast: Secondary | ICD-10-CM

## 2019-11-13 ENCOUNTER — Telehealth: Payer: Self-pay | Admitting: Family Medicine

## 2019-11-13 NOTE — Telephone Encounter (Signed)
Patient had prior auth started by pharmacy for [employer] but this med is not on current med list. I wanted to be sure patient was using before filling out prior auth. Awaiting return call from patient. AS, CMA

## 2019-11-13 NOTE — Telephone Encounter (Signed)
Per patient she has discontinued this medication and does not need the prior auth. AS, CMA

## 2019-11-27 ENCOUNTER — Ambulatory Visit

## 2019-12-03 ENCOUNTER — Other Ambulatory Visit: Payer: Self-pay | Admitting: Family Medicine

## 2019-12-03 DIAGNOSIS — E039 Hypothyroidism, unspecified: Secondary | ICD-10-CM

## 2019-12-11 ENCOUNTER — Other Ambulatory Visit

## 2019-12-16 ENCOUNTER — Other Ambulatory Visit: Payer: Self-pay | Admitting: Family Medicine

## 2019-12-16 DIAGNOSIS — E119 Type 2 diabetes mellitus without complications: Secondary | ICD-10-CM

## 2019-12-18 ENCOUNTER — Ambulatory Visit: Admitting: Family Medicine

## 2019-12-29 ENCOUNTER — Other Ambulatory Visit: Payer: Self-pay | Admitting: Family Medicine

## 2019-12-29 DIAGNOSIS — E1159 Type 2 diabetes mellitus with other circulatory complications: Secondary | ICD-10-CM

## 2019-12-30 ENCOUNTER — Other Ambulatory Visit: Payer: Self-pay | Admitting: Family Medicine

## 2019-12-30 DIAGNOSIS — E785 Hyperlipidemia, unspecified: Secondary | ICD-10-CM

## 2019-12-30 DIAGNOSIS — E1169 Type 2 diabetes mellitus with other specified complication: Secondary | ICD-10-CM

## 2020-01-01 ENCOUNTER — Other Ambulatory Visit: Payer: Self-pay

## 2020-01-01 ENCOUNTER — Other Ambulatory Visit

## 2020-01-01 DIAGNOSIS — E559 Vitamin D deficiency, unspecified: Secondary | ICD-10-CM

## 2020-01-01 DIAGNOSIS — E119 Type 2 diabetes mellitus without complications: Secondary | ICD-10-CM

## 2020-01-01 DIAGNOSIS — E781 Pure hyperglyceridemia: Secondary | ICD-10-CM

## 2020-01-02 LAB — LIPID PANEL
Chol/HDL Ratio: 1.6 ratio (ref 0.0–4.4)
Cholesterol, Total: 111 mg/dL (ref 100–199)
HDL: 69 mg/dL (ref >39)
LDL Chol Calc (NIH): 29 mg/dL (ref 0–99)
Triglycerides: 57 mg/dL (ref 0–149)
VLDL Cholesterol Cal: 13 mg/dL (ref 5–40)

## 2020-01-02 LAB — VITAMIN D 25 HYDROXY (VIT D DEFICIENCY, FRACTURES): Vit D, 25-Hydroxy: 52 ng/mL (ref 30.0–100.0)

## 2020-01-02 LAB — HEMOGLOBIN A1C
Est. average glucose Bld gHb Est-mCnc: 123 mg/dL
Hgb A1c MFr Bld: 5.9 % — ABNORMAL HIGH (ref 4.8–5.6)

## 2020-01-08 ENCOUNTER — Ambulatory Visit (INDEPENDENT_AMBULATORY_CARE_PROVIDER_SITE_OTHER): Admitting: Family Medicine

## 2020-01-08 ENCOUNTER — Ambulatory Visit
Admission: RE | Admit: 2020-01-08 | Discharge: 2020-01-08 | Disposition: A | Discharge status: Home / Self Care | Source: Ambulatory Visit | Attending: Family Medicine | Admitting: Family Medicine

## 2020-01-08 ENCOUNTER — Other Ambulatory Visit: Payer: Self-pay

## 2020-01-08 ENCOUNTER — Telehealth: Payer: Self-pay | Admitting: Family Medicine

## 2020-01-08 ENCOUNTER — Encounter: Payer: Self-pay | Admitting: Family Medicine

## 2020-01-08 VITALS — BP 116/74 | Temp 98.4°F | Ht 69.5 in | Wt 219.0 lb

## 2020-01-08 DIAGNOSIS — E669 Obesity, unspecified: Secondary | ICD-10-CM

## 2020-01-08 DIAGNOSIS — I10 Essential (primary) hypertension: Secondary | ICD-10-CM

## 2020-01-08 DIAGNOSIS — E119 Type 2 diabetes mellitus without complications: Secondary | ICD-10-CM | POA: Diagnosis not present

## 2020-01-08 DIAGNOSIS — C9110 Chronic lymphocytic leukemia of B-cell type not having achieved remission: Secondary | ICD-10-CM

## 2020-01-08 DIAGNOSIS — E1169 Type 2 diabetes mellitus with other specified complication: Secondary | ICD-10-CM | POA: Diagnosis not present

## 2020-01-08 DIAGNOSIS — Z1231 Encounter for screening mammogram for malignant neoplasm of breast: Secondary | ICD-10-CM

## 2020-01-08 DIAGNOSIS — E1159 Type 2 diabetes mellitus with other circulatory complications: Secondary | ICD-10-CM | POA: Diagnosis not present

## 2020-01-08 DIAGNOSIS — E66811 Obesity, class 1: Secondary | ICD-10-CM

## 2020-01-08 DIAGNOSIS — E785 Hyperlipidemia, unspecified: Secondary | ICD-10-CM

## 2020-01-08 DIAGNOSIS — E781 Pure hyperglyceridemia: Secondary | ICD-10-CM

## 2020-01-08 DIAGNOSIS — I152 Hypertension secondary to endocrine disorders: Secondary | ICD-10-CM

## 2020-01-08 MED ORDER — ATORVASTATIN CALCIUM 10 MG PO TABS: 5.0000 mg | ORAL_TABLET | Freq: Every day | ORAL | 1 refills | Status: DC

## 2020-01-08 MED ORDER — LISINOPRIL-HYDROCHLOROTHIAZIDE 20-25 MG PO TABS: 1.0000 | ORAL_TABLET | Freq: Every day | ORAL | 1 refills | Status: DC

## 2020-01-08 NOTE — Progress Notes (Signed)
Telehealth office visit note for Rachel Simmons, D.O- at Primary Care at [facility]   I connected with current patient today and verified that I am speaking with the correct person using two identifiers.   . Location of the patient: Home . Location of the provider: Office Only the patient (+/- their family members at pt's discretion) and myself were participating in the encounter - This visit type was conducted due to national recommendations for restrictions regarding the COVID-19 Pandemic (e.g. social distancing) in an effort to limit this patient's exposure and mitigate transmission in our community.  This format is felt to be most appropriate for this patient at this time.   - No physical exam could be performed with this format, beyond that communicated to [place] by the patient/ family members as noted.   - Additionally my office staff/ schedulers discussed with the patient that there may be a monetary charge related to this service, depending on their medical insurance.   The patient expressed understanding, and agreed to proceed.       History of Present Illness: Hypertension, Hypothyroidism, Hyperlipidemia, and Diabetes    Notes doing well.  Had her mammogram today.  Notes "the more weight I lose, the easier it is."  - Lifestyle Changes & Weight Loss Before he ever tried to lose weight, notes she started out at 403 lbs, in 2014.  She's now at 219 lbs today, down from 230 prior, and continues with her dietary and lifestyle changes since July.  Says she tried to go off of her diet for about six weeks, got back on it, and started exercising daily at home.  She walks her dog two miles daily.  Notes that her dog has lost twenty lbs; states "he's a fatty but he's getting there."  In addition, she has an exercise ball, which she uses to stretch and do crunches.  Does 45 minutes per day of crunches and leg kicks, and notes "gotten pretty good at balancing and not falling off of  it."  Her goal is to lose twenty more lbs.  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   - Vitamin D Supplementation Continues once-weekly Vitamin D.  HPI:   Diabetes Mellitus:  Home glucose readings:  Normal readings aside from some highs during her six weeks off of her diet.   - Patient reports good compliance with therapy plan: medication and/or lifestyle modification.  Thinks the 500 mg metformin twice per day (morning and night) is "really working."  Notes some of her sugars are high; on her six week break, notes "went a little crazy there," up to 140, "which is high for me."  Notes during her break, she had cookies, cake, candy, pasta.  - Her denies acute concerns or problems related to treatment plan  - She denies new concerns.  Denies polyuria/polydipsia, hypo/ hyperglycemia symptoms.  Denies new onset of: chest pain, exercise intolerance, shortness of breath, dizziness, visual changes, headache, lower extremity swelling or claudication.   Last A1C in the office was:  Lab Results  Component Value Date   HGBA1C  5.9 (H) 01/01/2020   HGBA1C 5.7 (H) 09/11/2019   HGBA1C 7.1 (A) 01/30/2019   Lab Results  Component Value Date   MICROALBUR 10 01/30/2019   LDLCALC 29 01/01/2020   CREATININE 0.61 09/11/2019   BP Readings from Last 3 Encounters:  01/08/20 116/74  09/18/19 113/77  07/03/19 108/68   Wt Readings from Last 3 Encounters:  01/08/20 219 lb (99.3 kg)  09/18/19 230 lb 8 oz (104.6 kg)  07/03/19 247 lb (112 kg)   HPI:  Hyperlipidemia:  54 y.o. female here for cholesterol follow-up.   - Patient reports good compliance with  treatment plan of:  medication and/ or lifestyle management.    Takes a half-tablet of statin at bedtime, 10 mg.  - Patient denies any acute concerns or problems with management plan   - She denies new onset of: myalgias, arthralgias, increased fatigue more than normal, chest pains, exercise intolerance, shortness of breath, dizziness, visual changes, headache, lower extremity swelling or claudication.   Most recent cholesterol panel was:  Lab Results  Component Value Date   CHOL 111 01/01/2020   HDL 69 01/01/2020   LDLCALC 29 01/01/2020   TRIG 57 01/01/2020   CHOLHDL 1.6 01/01/2020   Hepatic Function Latest Ref Rng & Units 09/11/2019 10/03/2018 08/18/2016  Total Protein 6.0 - 8.5 g/dL 6.5 6.2 -  Albumin 3.8 - 4.9 g/dL 4.3 3.9 4.2  AST 0 - 40 IU/L _0 ALT 0 - 32 IU/L _1 Alk Phosphatase 39 - 117 IU/L 65 65 59  Total Bilirubin 0.0 - 1.2 mg/dL 0.4 0.5 -    HPI:  Hypertension:  -  Her blood pressure at home has been running: "holding steady, no real highs, I don't have any lows."  Notes this morning it was 116/74.  She checks it in the mornings, daily.  Says "I just feel good."  - Patient reports good compliance with medication and/or lifestyle modification  - Her denies acute concerns or problems related to treatment plan  - She denies new onset of: chest pain, exercise intolerance, shortness of breath, dizziness, visual changes, headache, lower extremity swelling or claudication.   Last 3 blood pressure readings in our office are as follows: BP Readings from Last 3 Encounters:  01/08/20 116/74  09/18/19 113/77  07/03/19 108/68   Filed Weights   01/08/20 0907  Weight: 219 lb (99.3 kg)        GAD 7 : Generalized Anxiety Score 07/03/2019  Nervous, Anxious, on Edge 0  Control/stop worrying 0  Worry too much - different things 0  Trouble relaxing 0  Restless 0  Easily annoyed or irritable 0  Afraid - awful might happen 0  Total GAD 7 Score 0     Depression screen [facility] 2/9 01/08/2020 09/18/2019 07/03/2019 01/30/2019 12/18/2018  Decreased Interest 0 0 0 0 0  Down, Depressed, Hopeless 0 0 0 0 0  PHQ - 2 Score 0 0 0 0 0  Altered sleeping 0 0 0 1 0  Tired, decreased energy 0 0 0 1 0  Change in appetite 0 0 0 1 0  Feeling bad or failure about yourself  0 0 0 0 0  Trouble concentrating 0 0 0 0 0  Moving slowly or fidgety/restless 0 0 0 0 0  Suicidal thoughts 0 0 0 0 0  PHQ-9 Score 0 0 0 3 0  Difficult doing work/chores - Not difficult at all - - -  Some recent data  might be hidden      Impression and Recommendations:    1. Type II Diabetes mellitus without complication ([place])   2. Hypertension associated with diabetes ([place])   3. Hypertriglyceridemia   4. Hyperlipidemia associated with type 2 diabetes mellitus ([place])   5. CLL (chronic lymphocytic leukemia) (HCC)   6. Class 1 obesity with serious comorbidity in adult, unspecified BMI, unspecified obesity type      CLL - Per patient, has an appointment scheduled with oncology May 17th. - Will continue to monitor alongside specialist.  Vitamin D Deficiency - 52.0 last check, stable. - Continue management as prescribed. - Discussed goal of maintenance in 40-60 range. - Will continue to monitor and re-check as discussed.  Type II Diabetes Mellitus - A1c most recently is stable at 5.9, at goal, slightly increased since 5.7 prior.  - Pt will continue current treatment regimen.  See med list. - Patient knows to monitor for lows.  If she does experience low blood sugars, told the patient she may cut her dose of metformin in half, and take a half tablet twice per day.  Patient denies concerns of low blood sugar.  - Counseled patient on pathophysiology of disease and discussed various treatment options, which always includes dietary and lifestyle modification as first line.    - Importance of low carb, heart-healthy diet discussed with patient in addition to regular aerobic exercise  of 72mn 5d/week or more.   - Check FBS and 2 hours after the biggest meal of your day.  Keep log and bring in next OV for my review.     - Also told patient if you ever feel poorly, please check your blood pressure and blood sugar, as one or the other could be the cause of your symptoms.  - Pt reminded about need for yearly eye and foot exams.  Told patient to make appt.for diabetic eye exam, CMAs here will do foot exams  - We will continue to monitor.  Hyperlipidemia associated with DM, Hypertriglyceridemia - Last FLP obtained seven days ago:  Triglycerides = 57, down from 91 prior. HDL = 69, up from 57 prior. LDL = 29, stable from 19 prior.  - Cholesterol levels stable, at goal last check. - Patient has been taking 10 mg of statin nightly. - Reduce dose to 5 mg nightly (half tablet of 10 mg).  See med list.  - Discussed ongoing prudent dietary changes such as low saturated & trans fat diets for hyperlipidemia and low carb diets for hypertriglyceridemia.  - To improve HDL, encouraged patient to follow AHA guidelines for regular exercise and also engage in weight loss if BMI above 25.   - We will continue to monitor and re-check as discussed.  .Hypertension associated with DM - Blood pressure currently is stable, at goal. - Patient will continue current treatment regimen.  See med list. - If patient does experience lows, she knows to cut her medication in half.  - Counseled patient on pathophysiology of disease and discussed various treatment options, which always includes dietary and lifestyle modification as first line.   - Lifestyle changes such as dash and heart healthy diets and engaging in a regular exercise program discussed extensively with patient.   - Ambulatory blood pressure monitoring encouraged at least 3 times weekly.  Keep log and bring in every office visit.  Reminded patient that if they ever feel poorly in any way, to check their blood pressure and pulse.  -  We will continue  to monitor.  BMI Counseling - Body mass index is 31.88 kg/m Explained to patient what BMI refers to, and what it means medically.    Told patient to think about it as a "medical risk stratification measurement" and how increasing BMI is associated with increasing risk/ or worsening state of various diseases such as hypertension, hyperlipidemia, diabetes, premature OA, depression etc.  American Heart Association guidelines for healthy diet, basically Mediterranean diet, and exercise guidelines of 30 minutes 5 days per week or more discussed in detail.  Health counseling performed.  All questions answered.  - Encouraged patient to continue with healthy lifestyle habits as established since July of 2020.  Health Counseling & Preventative Maintenance - Advised patient to continue working toward exercising and prudent weight loss to improve overall mental, physical, and emotional health.    - Reviewed the "spokes of the wheel" of mood and health management.  Stressed the importance of ongoing prudent habits, including regular exercise, appropriate sleep hygiene, healthful dietary habits, and prayer/meditation to relax.  - Encouraged patient to engage in daily physical activity as tolerated, especially a formal exercise routine.  Recommended that the patient eventually strive for at least 150 minutes of moderate cardiovascular activity per week according to guidelines established by the [facility].   - Healthy dietary habits encouraged, including low-carb, and high amounts of lean protein in diet.   - Patient should also consume adequate amounts of water.   - As part of my medical decision making, I reviewed the following data within the [place] History obtained from pt /family, CMA notes reviewed and incorporated if applicable, Labs reviewed, Radiograph/ tests reviewed if applicable and OV notes from prior OV's with me, as well as other specialists she/he has seen  since seeing me last, were all reviewed and used in my medical decision making process today.    - Additionally, discussion had with patient regarding our treatment plan, and their biases/concerns about that plan were used in my medical decision making today.    - The patient agreed with the plan and demonstrated an understanding of the instructions.   No barriers to understanding were identified.    - Red flag symptoms and signs discussed in detail.  Patient expressed understanding regarding what to do in case of emergency\ urgent symptoms.   - The patient was advised to call back or seek an in-person evaluation if the symptoms worsen or if the condition fails to improve as anticipated.   Return for f/up 2 and a half months, third week in April, DM, HLD, HTN.    Meds ordered this encounter  Medications  . DISCONTD: lisinopril-hydrochlorothiazide (ZESTORETIC) 20-25 MG tablet    Sig: Take 1 tablet by mouth daily.    Dispense:  90 tablet    Refill:  1  . atorvastatin (LIPITOR) 10 MG tablet    Sig: Take 0.5 tablets (5 mg total) by mouth at bedtime.    Dispense:  45 tablet    Refill:  1    Medications Discontinued During This Encounter  Medication Reason  . lisinopril-hydrochlorothiazide (ZESTORETIC) 20-25 MG tablet Reorder  . atorvastatin (LIPITOR) 20 MG tablet       I provided 21+ minutes of non face-to-face time during this encounter.  Additional time was spent with charting and coordination of care before and after the actual visit commenced.   Note:  This note was prepared with assistance of Dragon voice recognition software. Occasional wrong-word or sound-a-like substitutions may have occurred due  to the inherent limitations of voice recognition software.  This case required medical decision making of at least moderate complexity. The above documentation from Toni Amend, medical scribe, has been reviewed by Marjory Sneddon, D.O.      Patient Care Team     Relationship Specialty Notifications Start End  Rachel Dance, DO PCP - General Family Medicine  08/18/16   Marice Potter, MD Consulting Physician Oncology  08/29/17   Marin Comment, My [place], [place] Referring Physician Optometry  10/10/17   Annia Belt, MD Referring Physician Internal Medicine  09/15/19    Comment: ONcologist she see's for her CLL     -Vitals obtained; medications/ allergies reconciled;  personal medical, social, Sx etc.histories were updated by CMA, reviewed by me and are reflected in chart   Patient Active Problem List   Diagnosis Date Noted  . Hyperlipidemia associated with type 2 diabetes mellitus ([place]) 08/29/2017  . Obesity, Class III, BMI 40-49.9 (morbid obesity) ([place]) 08/20/2016  . CLL (chronic lymphocytic leukemia) ([place]) 08/18/2016  . Type II Diabetes mellitus without complication ([place]) 99/83/3825  . Hypertension associated with diabetes ([place]) 08/18/2016  . Thyroid disease 08/18/2016  . Diabetic peripheral neuropathy ([place]) 04/21/2015  . Hypertriglyceridemia 10/18/2016  . Insomnia 10/18/2016  . 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 Adjustment disorder with mixed anxiety and depressed mood 08/20/2016  . Family history of breast cancer   . Family history of colon cancer- brother age 58   . depressed mood, recurrent 01/30/2019  . Flu-like symptoms 12/18/2018  . Myalgias and generalized arthralgias 06/20/2018  . High risk for vaginitis due to Candida secondary to antibiotic use 06/06/2018  . Left tennis elbow 10/10/2017  . Muscle dysfunction/ spasm- upper traps on L 10/10/2017  . Hypothyroidism 08/29/2017  . Family history of colon cancer requiring screening colonoscopy 08/20/2016  . Chronic back pain greater than 3 months duration 08/18/2016  . Genetic testing 05/21/2015  . Family history of polyps in the colon      Current Meds  Medication Sig  . aspirin 81 MG chewable tablet Chew 81 mg by mouth daily.  Marland Kitchen atorvastatin (LIPITOR) 10 MG tablet Take 0.5  tablets (5 mg total) by mouth at bedtime.  . calcium carbonate (CALCIUM 600) 600 MG TABS tablet Take 1 tablet by mouth daily.  . cetirizine (ZYRTEC) 5 MG tablet Take 1 tablet by mouth daily.  Marland Kitchen FLUoxetine (PROZAC) 20 MG capsule TAKE 1 CAPSULE DAILY  . glucose blood (FREESTYLE LITE) test strip CHECK FASTING BLOOD SUGAR AND 2 HOURS AFTER A MEAL  . levothyroxine (LEVOXYL) 112 MCG tablet Take 1 tablet (112 mcg total) by mouth daily before breakfast.  . metFORMIN (GLUCOPHAGE) 500 MG tablet TAKE 1 TABLET TWICE A DAY WITH MEALS  . Multiple Vitamins-Minerals (ONE-A-DAY WOMENS 50 PLUS PO) Take 1 tablet daily by mouth.  . Omega-3 Fatty Acids (FISH OIL) 1200 MG CAPS Take 4-5 tabs daily  . Vitamin D, Ergocalciferol, (DRISDOL) 1.25 MG (50000 UT) CAPS capsule TAKE 1 CAPSULE EVERY 7 DAYS  . [DISCONTINUED] atorvastatin (LIPITOR) 20 MG tablet TAKE 1 TABLET AT BEDTIME (Patient taking differently: 10 mg. )  . [DISCONTINUED] lisinopril-hydrochlorothiazide (ZESTORETIC) 20-25 MG tablet TAKE 1 TABLET DAILY  . [DISCONTINUED] lisinopril-hydrochlorothiazide (ZESTORETIC) 20-25 MG tablet Take 1 tablet by mouth daily.     Allergies:  No Known Allergies   ROS:  See above HPI for pertinent positives and negatives   Objective:   Blood pressure 116/74, temperature 98.4 F (36.9 C), temperature source Oral,  height 5' 9.5" (1.765 m), weight 219 lb (99.3 kg).  (if some vitals are omitted, this means that patient was UNABLE to obtain them even though they were asked to get them prior to OV today.  They were asked to call us at their earliest convenience with these once obtained. )  General: A & O * 3; sounds in no acute distress; in usual state of health.  Skin: Pt confirms warm and dry extremities and pink fingertips HEENT: Pt confirms lips non-cyanotic Chest: Patient confirms normal chest excursion and movement Respiratory: speaking in full sentences, no conversational dyspnea; patient confirms no use of accessory  muscles Psych: insight appears good, mood- appears full

## 2020-01-08 NOTE — Telephone Encounter (Signed)
Patient returned CMA's call .   --glh

## 2020-01-09 ENCOUNTER — Telehealth: Payer: Self-pay | Admitting: Family Medicine

## 2020-01-09 DIAGNOSIS — E1159 Type 2 diabetes mellitus with other circulatory complications: Secondary | ICD-10-CM

## 2020-01-09 DIAGNOSIS — I152 Hypertension secondary to endocrine disorders: Secondary | ICD-10-CM

## 2020-01-09 MED ORDER — LISINOPRIL-HYDROCHLOROTHIAZIDE 20-25 MG PO TABS: 1.0000 | ORAL_TABLET | Freq: Every day | ORAL | 1 refills | Status: DC

## 2020-01-09 NOTE — Telephone Encounter (Signed)
Patient requested refill of lisinopril-hctz. Med refill sent to pharmacy of patient choice. AS, CMA

## 2020-01-09 NOTE — Telephone Encounter (Signed)
Patient is requesting a call back from clinic staff to discuss her lisinopril refill. Please contact when available

## 2020-01-09 NOTE — Addendum Note (Signed)
Addended by: Mickel Crow on: 01/09/2020 11:03 AM   Modules accepted: Orders

## 2020-02-09 ENCOUNTER — Other Ambulatory Visit: Payer: Self-pay | Admitting: Family Medicine

## 2020-02-09 DIAGNOSIS — E039 Hypothyroidism, unspecified: Secondary | ICD-10-CM

## 2020-02-14 ENCOUNTER — Telehealth: Payer: Self-pay | Admitting: Family Medicine

## 2020-02-14 NOTE — Telephone Encounter (Signed)
02/14/2020  Informed pt that since she has not been seen for this problem in 2 years, she will need an OV for evaluation and/or treatment.  Pt expressed understanding and is agreeable.  Charyl Bigger, CMA

## 2020-02-14 NOTE — Telephone Encounter (Signed)
Patient called states Dr.O has treated her for chronic back pt & in the past prescribed Rx---per pt she has tried [employer] /heating pad & OTC rubs/ meds with no success, she is asking for Rx provider gave her last time .  --Forwarding request to med asst for review w/provider & if approved send Rx to :   [facility] ([facility] [place] [id number] (Phone) [phone] (Fax)   --Please call pt if there are any questions or concerns.  --glh

## 2020-02-24 ENCOUNTER — Ambulatory Visit: Admitting: Family Medicine

## 2020-02-27 ENCOUNTER — Ambulatory Visit: Attending: Internal Medicine

## 2020-02-27 DIAGNOSIS — Z23 Encounter for immunization: Secondary | ICD-10-CM

## 2020-02-27 NOTE — Progress Notes (Signed)
   Covid-19 Vaccination Clinic  Name:  Abbey Soli    MRN: [id number] DOB: 08/19/1966  02/27/2020  Ms. Kawano was observed post Covid-19 immunization for 15 minutes without incident. She was provided with Vaccine Information Sheet and instruction to access the V-Safe system.   Ms. Oliverson was instructed to call 911 with any severe reactions post vaccine: Marland Kitchen Difficulty breathing  . Swelling of face and throat  . A fast heartbeat  . A bad rash all over body  . Dizziness and weakness   Immunizations Administered    Name Date Dose VIS Date Route   Pfizer COVID-19 Vaccine 02/27/2020  3:34 PM 0.3 mL 11/15/2019 Intramuscular   Manufacturer: [place]   Lot: [id number]   [employer]: [id number]

## 2020-03-11 ENCOUNTER — Ambulatory Visit: Admitting: Family Medicine

## 2020-03-13 ENCOUNTER — Other Ambulatory Visit: Payer: Self-pay | Admitting: Family Medicine

## 2020-03-15 ENCOUNTER — Other Ambulatory Visit: Payer: Self-pay | Admitting: Family Medicine

## 2020-03-15 DIAGNOSIS — E119 Type 2 diabetes mellitus without complications: Secondary | ICD-10-CM

## 2020-03-24 ENCOUNTER — Ambulatory Visit: Attending: Internal Medicine

## 2020-03-24 DIAGNOSIS — Z23 Encounter for immunization: Secondary | ICD-10-CM

## 2020-03-24 NOTE — Progress Notes (Signed)
   Covid-19 Vaccination Clinic  Name:  Rachel Simmons    MRN: [id number] DOB: 10/21/1966  03/24/2020  [name] was observed post Covid-19 immunization for 15 minutes without incident. She was provided with Vaccine Information Sheet and instruction to access the V-Safe system.   [name] was instructed to call 911 with any severe reactions post vaccine: Marland Kitchen Difficulty breathing  . Swelling of face and throat  . A fast heartbeat  . A bad rash all over body  . Dizziness and weakness   Immunizations Administered    Name Date Dose VIS Date Route   Pfizer COVID-19 Vaccine 03/24/2020 12:58 PM 0.3 mL 01/29/2019 Intramuscular   Manufacturer: [place]   Lot: [id number]   [place]: [id number]

## 2020-03-27 ENCOUNTER — Encounter: Payer: Self-pay | Admitting: Family Medicine

## 2020-03-31 ENCOUNTER — Other Ambulatory Visit: Payer: Self-pay | Admitting: Family Medicine

## 2020-03-31 DIAGNOSIS — E119 Type 2 diabetes mellitus without complications: Secondary | ICD-10-CM

## 2020-04-29 ENCOUNTER — Ambulatory Visit (INDEPENDENT_AMBULATORY_CARE_PROVIDER_SITE_OTHER): Admitting: Physician Assistant

## 2020-04-29 ENCOUNTER — Other Ambulatory Visit: Payer: Self-pay

## 2020-04-29 ENCOUNTER — Encounter: Payer: Self-pay | Admitting: Physician Assistant

## 2020-04-29 VITALS — BP 89/59 | HR 90 | Temp 98.3°F | Ht 69.5 in | Wt 247.1 lb

## 2020-04-29 DIAGNOSIS — E781 Pure hyperglyceridemia: Secondary | ICD-10-CM | POA: Diagnosis not present

## 2020-04-29 DIAGNOSIS — E1159 Type 2 diabetes mellitus with other circulatory complications: Secondary | ICD-10-CM

## 2020-04-29 DIAGNOSIS — F4323 Adjustment disorder with mixed anxiety and depressed mood: Secondary | ICD-10-CM

## 2020-04-29 DIAGNOSIS — E785 Hyperlipidemia, unspecified: Secondary | ICD-10-CM

## 2020-04-29 DIAGNOSIS — I10 Essential (primary) hypertension: Secondary | ICD-10-CM

## 2020-04-29 DIAGNOSIS — E1169 Type 2 diabetes mellitus with other specified complication: Secondary | ICD-10-CM

## 2020-04-29 DIAGNOSIS — I152 Hypertension secondary to endocrine disorders: Secondary | ICD-10-CM

## 2020-04-29 DIAGNOSIS — E119 Type 2 diabetes mellitus without complications: Secondary | ICD-10-CM | POA: Diagnosis not present

## 2020-04-29 DIAGNOSIS — Z6835 Body mass index (BMI) 35.0-35.9, adult: Secondary | ICD-10-CM

## 2020-04-29 LAB — POCT UA - MICROALBUMIN
Albumin/Creatinine Ratio, Urine, POC: <30
Creatinine, POC: 300 mg/dL
Microalbumin Ur, POC: 10 mg/L

## 2020-04-29 LAB — POCT GLYCOSYLATED HEMOGLOBIN (HGB A1C): Hemoglobin A1C: 5.7 % — AB (ref 4.0–5.6)

## 2020-04-29 MED ORDER — FLUOXETINE HCL 20 MG PO CAPS: ORAL_CAPSULE | ORAL | 1 refills | Status: DC

## 2020-04-29 MED ORDER — METFORMIN HCL 500 MG PO TABS: ORAL_TABLET | ORAL | 1 refills | Status: DC

## 2020-04-29 NOTE — Patient Instructions (Signed)
Diabetes Mellitus and Exercise Exercising regularly is important for your overall health, especially when you have diabetes (diabetes mellitus). Exercising is not only about losing weight. It has many other health benefits, such as increasing muscle strength and bone density and reducing body fat and stress. This leads to improved fitness, flexibility, and endurance, all of which result in better overall health. Exercise has additional benefits for people with diabetes, including:  Reducing appetite.  Helping to lower and control blood glucose.  Lowering blood pressure.  Helping to control amounts of fatty substances (lipids) in the blood, such as cholesterol and triglycerides.  Helping the body to respond better to insulin (improving insulin sensitivity).  Reducing how much insulin the body needs.  Decreasing the risk for heart disease by: ? Lowering cholesterol and triglyceride levels. ? Increasing the levels of good cholesterol. ? Lowering blood glucose levels. What is my activity plan? Your health care provider or certified diabetes educator can help you make a plan for the type and frequency of exercise (activity plan) that works for you. Make sure that you:  Do at least 150 minutes of moderate-intensity or vigorous-intensity exercise each week. This could be brisk walking, biking, or water aerobics. ? Do stretching and strength exercises, such as yoga or weightlifting, at least 2 times a week. ? Spread out your activity over at least 3 days of the week.  Get some form of physical activity every day. ? Do not go more than 2 days in a row without some kind of physical activity. ? Avoid being inactive for more than 30 minutes at a time. Take frequent breaks to walk or stretch.  Choose a type of exercise or activity that you enjoy, and set realistic goals.  Start slowly, and gradually increase the intensity of your exercise over time. What do I need to know about managing my  diabetes?   Check your blood glucose before and after exercising. ? If your blood glucose is 240 mg/dL (13.3 mmol/L) or higher before you exercise, check your urine for ketones. If you have ketones in your urine, do not exercise until your blood glucose returns to normal. ? If your blood glucose is 100 mg/dL (5.6 mmol/L) or lower, eat a snack containing 15-20 grams of carbohydrate. Check your blood glucose 15 minutes after the snack to make sure that your level is above 100 mg/dL (5.6 mmol/L) before you start your exercise.  Know the symptoms of low blood glucose (hypoglycemia) and how to treat it. Your risk for hypoglycemia increases during and after exercise. Common symptoms of hypoglycemia can include: ? Hunger. ? Anxiety. ? Sweating and feeling clammy. ? Confusion. ? Dizziness or feeling light-headed. ? Increased heart rate or palpitations. ? Blurry vision. ? Tingling or numbness around the mouth, lips, or tongue. ? Tremors or shakes. ? Irritability.  Keep a rapid-acting carbohydrate snack available before, during, and after exercise to help prevent or treat hypoglycemia.  Avoid injecting insulin into areas of the body that are going to be exercised. For example, avoid injecting insulin into: ? The arms, when playing tennis. ? The legs, when jogging.  Keep records of your exercise habits. Doing this can help you and your health care provider adjust your diabetes management plan as needed. Write down: ? Food that you eat before and after you exercise. ? Blood glucose levels before and after you exercise. ? The type and amount of exercise you have done. ? When your insulin is expected to peak, if you use   insulin. Avoid exercising at times when your insulin is peaking.  When you start a new exercise or activity, work with your health care provider to make sure the activity is safe for you, and to adjust your insulin, medicines, or food intake as needed.  Drink plenty of water while  you exercise to prevent dehydration or heat stroke. Drink enough fluid to keep your urine clear or pale yellow. Summary  Exercising regularly is important for your overall health, especially when you have diabetes (diabetes mellitus).  Exercising has many health benefits, such as increasing muscle strength and bone density and reducing body fat and stress.  Your health care provider or certified diabetes educator can help you make a plan for the type and frequency of exercise (activity plan) that works for you.  When you start a new exercise or activity, work with your health care provider to make sure the activity is safe for you, and to adjust your insulin, medicines, or food intake as needed. This information is not intended to replace advice given to you by your health care provider. Make sure you discuss any questions you have with your health care provider. Document Revised: 06/15/2017 Document Reviewed: 05/02/2016 Elsevier Patient Education  2020 [employer] Inc[employer]  

## 2020-04-29 NOTE — Progress Notes (Signed)
Established Patient Office Visit  Subjective:  Patient ID: Rachel Simmons, female    DOB: 07-Jun-1966  Age: 54 y.o. MRN: [id number]  CC:  Chief Complaint  Patient presents with  . Diabetes  . Hyperlipidemia  . Hypertension    HPI [facility] presents for chronic follow-up on diabetes mellitus, hyperlipidemia, and hypertension.  Diabetes: Pt denies increased urination or thirst. Pt reports medication compliance. No hypoglycemic events. Checking glucose at home.  FBS average around 100s. 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 She walks about 3 miles daily.   HTN: Pt denies chest pain, palpitations, dizziness or lower extremity swelling. Taking medication as directed without side effects. Checks BP at home and readings range in 107/70s. Pt follows a low salt diet.  HLD: Pt taking medication as directed without issues. Denies side effects including myalgias and RUQ pain.  Mood: Doing well and requests refill of Prozac.   Past Medical History:  Diagnosis Date  . Chronic lymphoblastic leukemia   . Diabetes mellitus without complication ([place])   . Family history of breast cancer   . Family history of colon cancer   . Family history of polyps in the colon   . Hypertension   . Thyroid disease     Past Surgical History:  Procedure Laterality Date  . ABDOMINAL HYSTERECTOMY  2006   reports ovaries and cervix are intact  . TONSILLECTOMY      Family History  Problem Relation Age of Onset  . Breast cancer Mother        33s; deceased 43  . Colon polyps Mother        Dx 9s; #/type unknown  . Cancer Father        lung cancer; smoker; deceased 44  . Breast cancer Sister 41       triple negative; currently 35  . Cancer Maternal Aunt        lung cancer; smoker; currently 75  . Colon cancer Maternal Uncle        Dx 72s; currently 59  . Breast cancer Paternal Aunt        Dx 56s; currently 65s  . Cancer Paternal Uncle        liver; heavy  drinker  . Cancer Maternal Grandfather        unk. primary; deceased 27s  . Cancer Paternal Grandmother        Dx 35s; unknown primary  . Breast cancer Sister 79       currently 4  . Uterine cancer Sister 64       currently 55  . Cancer Paternal Uncle        lung; heavy smoker  . Breast cancer Cousin        mat first cousin through aunt; poss. breast ca; had mastectomy; currently 50  . Ovarian cancer Other        mat grandmother's sister  . Cancer Other        stomach cancer; mat grandmother's sister  . Colon polyps Brother        age, #, type unknown    Social History   Socioeconomic History  . Marital status: Widowed    Spouse name: Not on file  . Number of children: Not on file  . Years of education: Not on file  . Highest education level: Not on file  Occupational History  . Not on file  Tobacco Use  . Smoking status: Never Smoker  .  Smokeless tobacco: Never Used  Substance and Sexual Activity  . Alcohol use: No  . Drug use: No  . Sexual activity: Yes    Birth control/protection: None  Other Topics Concern  . Not on file  Social History Narrative  . Not on file   Social Determinants of Health   Financial Resource Strain:   . Difficulty of Paying Living Expenses:   Food Insecurity:   . Worried About Charity fundraiser in the Last Year:   . Arboriculturist in the Last Year:   Transportation Needs:   . Film/video editor (Medical):   Marland Kitchen Lack of Transportation (Non-Medical):   Physical Activity:   . Days of Exercise per Week:   . Minutes of Exercise per Session:   Stress:   . Feeling of Stress :   Social Connections:   . Frequency of Communication with Friends and Family:   . Frequency of Social Gatherings with Friends and Family:   . Attends Religious Services:   . Active Member of Clubs or Organizations:   . Attends Archivist Meetings:   Marland Kitchen Marital Status:   Intimate Partner Violence:   . Fear of Current or Ex-Partner:   .  Emotionally Abused:   Marland Kitchen Physically Abused:   . Sexually Abused:     Outpatient Medications Prior to Visit  Medication Sig Dispense Refill  . aspirin 81 MG chewable tablet Chew 81 mg by mouth daily.    Marland Kitchen atorvastatin (LIPITOR) 10 MG tablet Take 0.5 tablets (5 mg total) by mouth at bedtime. 45 tablet 1  . calcium carbonate (CALCIUM 600) 600 MG TABS tablet Take 1 tablet by mouth daily.    . cetirizine (ZYRTEC) 5 MG tablet Take 1 tablet by mouth daily.    Marland Kitchen glucose blood (FREESTYLE LITE) test strip CHECK FASTING BLOOD SUGAR AND 2 HOURS AFTER A MEAL 200 each 3  . LEVOXYL 112 MCG tablet TAKE 1 TABLET DAILY BEFORE BREAKFAST 90 tablet 0  . lisinopril-hydrochlorothiazide (ZESTORETIC) 20-25 MG tablet Take 1 tablet by mouth daily. 90 tablet 1  . Multiple Vitamins-Minerals (ONE-A-DAY WOMENS 50 PLUS PO) Take 1 tablet daily by mouth.    . Omega-3 Fatty Acids (FISH OIL) 1200 MG CAPS Take 4-5 tabs daily    . Vitamin D, Ergocalciferol, (DRISDOL) 1.25 MG (50000 UNIT) CAPS capsule TAKE 1 CAPSULE EVERY 7 DAYS 12 capsule 0  . FLUoxetine (PROZAC) 20 MG capsule TAKE 1 CAPSULE DAILY (OFFICE VISIT REQUIRED PRIOR TO ANY FURTHER REFILLS) 30 capsule 0  . metFORMIN (GLUCOPHAGE) 500 MG tablet TAKE 1 TABLET TWICE A DAY WITH MEALS (OFFICE VISIT REQUIRED PRIOR TO ANY FURTHER REFILLS) 60 tablet 0   No facility-administered medications prior to visit.    No Known Allergies  ROS Review of Systems  A fourteen system review of systems was performed and found to be positive as per HPI.   Objective:    Physical Exam General: Well nourished, in no apparent distress. Eyes: PERRLA, EOMs, conjunctiva clr Resp: Respiratory effort- normal, ECTA B/L w/o W/R/R  Cardio: RRR w/o MRGs. Abdomen: no gross distention. Lymphatics:  less 2 sec cap RF M-sk: Full ROM, 5/5 strength, normal gait.  Skin: Warm, dry  Neuro: Alert, Oriented Psych: Normal affect, Insight and Judgment appropriate.   BP (!) 89/59   Pulse 90   Temp 98.3  F (36.8 C) (Oral)   Ht 5' 9.5" (1.765 m)   Wt 247 lb 1.6 oz (112.1 kg)   SpO2  97% Comment: on RA  BMI 35.97 kg/m  Wt Readings from Last 3 Encounters:  04/29/20 247 lb 1.6 oz (112.1 kg)  01/08/20 219 lb (99.3 kg)  09/18/19 230 lb 8 oz (104.6 kg)     Health Maintenance Due  Topic Date Due  . COLONOSCOPY  Never done  . OPHTHALMOLOGY EXAM  09/25/2018    There are no preventive care reminders to display for this patient.  Lab Results  Component Value Date   TSH 1.930 09/11/2019   Lab Results  Component Value Date   WBC 21.2 (HH) 09/11/2019   HGB 12.5 09/11/2019   HCT 37.2 09/11/2019   MCV 84 09/11/2019   PLT 260 09/11/2019   Lab Results  Component Value Date   NA 139 09/11/2019   K 4.3 09/11/2019   CO2 27 09/11/2019   GLUCOSE 91 09/11/2019   BUN 17 09/11/2019   CREATININE 0.61 09/11/2019   BILITOT 0.4 09/11/2019   ALKPHOS 65 09/11/2019   AST 20 09/11/2019   ALT 22 09/11/2019   PROT 6.5 09/11/2019   ALBUMIN 4.3 09/11/2019   CALCIUM 10.1 09/11/2019   Lab Results  Component Value Date   CHOL 111 01/01/2020   Lab Results  Component Value Date   HDL 69 01/01/2020   Lab Results  Component Value Date   LDLCALC 29 01/01/2020   Lab Results  Component Value Date   TRIG 57 01/01/2020   Lab Results  Component Value Date   CHOLHDL 1.6 01/01/2020   Lab Results  Component Value Date   HGBA1C 5.7 (A) 04/29/2020      Assessment & Plan:   Problem List Items Addressed This Visit      Cardiovascular and Mediastinum   Hypertension associated with diabetes ([place]) (Chronic)   Relevant Medications   metFORMIN (GLUCOPHAGE) 500 MG tablet     Endocrine   Type II Diabetes mellitus without complication ([facility]) - Primary (Chronic)   Relevant Medications   metFORMIN (GLUCOPHAGE) 500 MG tablet   Other Relevant Orders   POCT glycosylated hemoglobin (Hb A1C) (Completed)   POCT UA - Microalbumin (Completed)   Hyperlipidemia associated with type 2 diabetes mellitus  ([facility]) (Chronic)   Relevant Medications   metFORMIN (GLUCOPHAGE) 500 MG tablet     Other   Adjustment disorder with mixed anxiety and depressed mood (Chronic)   Relevant Medications   FLUoxetine (PROZAC) 20 MG capsule   Hypertriglyceridemia (Chronic)    Other Visit Diagnoses    BMI 35.0-35.9,adult         Diabetes: - A1c today is 5.7, at goal. - Continue Metformin.  - Continue ambulatory glucose monitoring and notify clinic if FBS consistently <80 or >160. - Continue low glucose/carbohydrate diet and physical activity. - She has already had her annual diabetic eye exam and we will request records.   HTN: - BP today is 89/59, HR 90 stable. - Pt states ambulatory BP is 107/70-76 and not usually lower than 90/60, so advised to continue ambulatory BP and pulse monitoring and notify the office if consistently <90/60 and will consider adjusting BP medication.  - Continue Lisinopril-HCTZ. - Continue DASH diet.  HLD: - Last lipid panel wnl's. - Continue Lipitor 10 mg 0.5 tablet. - Continue heart healthy diet.  Mood- Adjustment disorder with mixed anxiety and depression: - stable, provided refills of Prozac.  Meds ordered this encounter  Medications  . FLUoxetine (PROZAC) 20 MG capsule    Sig: TAKE 1 CAPSULE DAILY    Dispense:  90 capsule    Refill:  1    Order Specific Question:   Supervising Provider    Answer:   Beatrice Lecher D [[id number]]  . metFORMIN (GLUCOPHAGE) 500 MG tablet    Sig: TAKE 1 TABLET TWICE A DAY WITH MEALS    Dispense:  180 tablet    Refill:  1    Order Specific Question:   Supervising Provider    Answer:   Beatrice Lecher D [[id number]]    Follow-up: Return in about 3 months (around 07/30/2020) for DM, HTN, HLD.    Lorrene Reid, PA-C

## 2020-04-30 ENCOUNTER — Encounter: Payer: Self-pay | Admitting: Physician Assistant

## 2020-05-05 DIAGNOSIS — D72829 Elevated white blood cell count, unspecified: Secondary | ICD-10-CM

## 2020-05-10 ENCOUNTER — Other Ambulatory Visit: Payer: Self-pay | Admitting: Family Medicine

## 2020-05-10 DIAGNOSIS — E039 Hypothyroidism, unspecified: Secondary | ICD-10-CM

## 2020-05-25 ENCOUNTER — Encounter: Payer: Self-pay | Admitting: Physician Assistant

## 2020-06-05 ENCOUNTER — Other Ambulatory Visit: Payer: Self-pay | Admitting: Family Medicine

## 2020-06-20 ENCOUNTER — Other Ambulatory Visit: Payer: Self-pay | Admitting: Family Medicine

## 2020-06-20 DIAGNOSIS — E1169 Type 2 diabetes mellitus with other specified complication: Secondary | ICD-10-CM

## 2020-06-24 ENCOUNTER — Other Ambulatory Visit: Payer: Self-pay | Admitting: Family Medicine

## 2020-06-24 DIAGNOSIS — E039 Hypothyroidism, unspecified: Secondary | ICD-10-CM

## 2020-07-23 ENCOUNTER — Telehealth: Payer: Self-pay | Admitting: Physician Assistant

## 2020-07-23 MED ORDER — VITAMIN D (ERGOCALCIFEROL) 1.25 MG (50000 UNIT) PO CAPS: 50000.0000 [IU] | ORAL_CAPSULE | ORAL | 0 refills | Status: DC

## 2020-07-23 NOTE — Addendum Note (Signed)
Addended by: Mickel Crow on: 07/23/2020 11:39 AM   Modules accepted: Orders

## 2020-07-23 NOTE — Telephone Encounter (Signed)
Patient is requesting a refill of her Vit D, if approved please send to [facility].

## 2020-07-23 NOTE — Telephone Encounter (Signed)
Refill sent to requested pharmacy. AS, CMA 

## 2020-07-31 ENCOUNTER — Encounter (INDEPENDENT_AMBULATORY_CARE_PROVIDER_SITE_OTHER): Payer: Self-pay | Admitting: Family Medicine

## 2020-08-04 ENCOUNTER — Other Ambulatory Visit: Payer: Self-pay

## 2020-08-04 ENCOUNTER — Encounter: Payer: Self-pay | Admitting: Physician Assistant

## 2020-08-04 ENCOUNTER — Ambulatory Visit (INDEPENDENT_AMBULATORY_CARE_PROVIDER_SITE_OTHER): Admitting: Physician Assistant

## 2020-08-04 VITALS — BP 110/74 | Temp 98.4°F | Ht 70.0 in | Wt 259.0 lb

## 2020-08-04 DIAGNOSIS — E1159 Type 2 diabetes mellitus with other circulatory complications: Secondary | ICD-10-CM

## 2020-08-04 DIAGNOSIS — I152 Hypertension secondary to endocrine disorders: Secondary | ICD-10-CM

## 2020-08-04 DIAGNOSIS — F339 Major depressive disorder, recurrent, unspecified: Secondary | ICD-10-CM

## 2020-08-04 DIAGNOSIS — I10 Essential (primary) hypertension: Secondary | ICD-10-CM

## 2020-08-04 DIAGNOSIS — E1169 Type 2 diabetes mellitus with other specified complication: Secondary | ICD-10-CM

## 2020-08-04 DIAGNOSIS — E039 Hypothyroidism, unspecified: Secondary | ICD-10-CM | POA: Diagnosis not present

## 2020-08-04 DIAGNOSIS — E785 Hyperlipidemia, unspecified: Secondary | ICD-10-CM

## 2020-08-04 DIAGNOSIS — Z6837 Body mass index (BMI) 37.0-37.9, adult: Secondary | ICD-10-CM

## 2020-08-04 NOTE — Progress Notes (Signed)
Telehealth office visit note for Rachel Reid, PA-C- at Primary Care at [facility]   I connected with current patient today by telephone and verified that I am speaking with the correct person   . Location of the patient: Home . Location of the provider: Office - This visit type was conducted due to national recommendations for restrictions regarding the COVID-19 Pandemic (e.g. social distancing) in an effort to limit this patient's exposure and mitigate transmission in our community.    - No physical exam could be performed with this format, beyond that communicated to [place] by the patient/ family members as noted.   - Additionally my office staff/ schedulers were to discuss with the patient that there may be a monetary charge related to this service, depending on their medical insurance.  My understanding is that patient understood and consented to proceed.     _________________________________________________________________________________   History of Present Illness: Pt calls in to follow-up on diabetes mellitus, hypertension, and hyperlipidemia.  Diabetes: Pt denies increased urination or thirst. Pt reports medication compliance. No hypoglycemic events. Checking glucose at home. FBS range in 88-90s. Reports she continues to monitor diet and has reduced bread. She is staying active with doing stretches in the mornings and aerobic exercise.   HTN: Pt denies chest pain, palpitations, dizziness or lower extremity swelling. Taking medication as directed without side effects. Checks BP at home and readings range in 108-120/70s. States she is staying well hydrated.  HLD: Pt taking medication as directed without issues. Denies side effects including myalgias and RUQ pain. She follows a heart healthy diet.  Weight gain: Pt reports she has gained weight and has scheduled an appointment for [facility]. She is currently on the maintenance plan of [employer]. She has increased her  physical activity and is eating healthier but doesn't seem to be losing weight. Pt reports her eating schedule has changed since she started a new job.        GAD 7 : Generalized Anxiety Score 07/03/2019  Nervous, Anxious, on Edge 0  Control/stop worrying 0  Worry too much - different things 0  Trouble relaxing 0  Restless 0  Easily annoyed or irritable 0  Afraid - awful might happen 0  Total GAD 7 Score 0    Depression screen [facility] 2/9 08/04/2020 04/29/2020 01/08/2020 09/18/2019 07/03/2019  Decreased Interest 0 0 0 0 0  Down, Depressed, Hopeless 0 0 0 0 0  PHQ - 2 Score 0 0 0 0 0  Altered sleeping 1 0 0 0 0  Tired, decreased energy 0 0 0 0 0  Change in appetite 0 0 0 0 0  Feeling bad or failure about yourself  0 0 0 0 0  Trouble concentrating 0 0 0 0 0  Moving slowly or fidgety/restless 0 0 0 0 0  Suicidal thoughts 0 0 0 0 0  PHQ-9 Score 1 0 0 0 0  Difficult doing work/chores Not difficult at all - - Not difficult at all -  Some recent data might be hidden      Impression and Recommendations:     1. Type 2 diabetes mellitus with other specified complication, without long-term current use of insulin ([place])   2. Hypertension associated with diabetes ([place])   3. Hyperlipidemia associated with type 2 diabetes mellitus (HCC)   4. Class 2 severe obesity due to excess calories with serious comorbidity and body mass index (BMI) of 37.0 to 37.9 in adult [facility])  5. Hypothyroidism, unspecified type   6. depressed mood, recurrent      Type 2 diabetes mellitus with other specified complication, without long-term current use of insulin: -Last A1c stable -Continue current mediation regimen. -Continue low carbohydrate and glucose diet. -Continue to stay active. -Plan to recheck A1c with CPE.  Hypertension associated with diabetes: -BP at goal -Continue current medication regimen. -Follow low sodium diet and continue good hydration. -Plan to recheck CMP with CPE.  Hyperlipidemia  associated with T2 diabetes mellitus: -Last lipid panel wnl, LDL 29 -Continue current medication regimen. -Continue heart healthy diet and physical activity. -Plan to recheck lipid panel and hepatic function with CPE.  Class 2 severe obesity due to excess calories with serious comorbidity and body mass index of 37.0 to 37.9 in adult: -Body mass index is 37.16 kg/m. -Associated with hypertension, hyperlipidemia, and diabetes mellitus -Follow up with [facility] as scheduled. -Encourage to continue with dietary and lifestyle modifications.   Hypothyroidism: -Stable -Last TSH wnl -Continue current medication regimen. -Plan to recheck thyroid labs with CPE.  Depressed mood, recurrent: -Stable, PHQ-9 score of 1 -Continue current medication regimen. -Continue nonpharmacologic therapy such as exercise. -Will continue to monitor.   - As part of my medical decision making, I reviewed the following data within the [place] History obtained from pt /family, CMA notes reviewed and incorporated if applicable, Labs reviewed, Radiograph/ tests reviewed if applicable and OV notes from prior OV's with me, as well as any other specialists she/he has seen since seeing me last, were all reviewed and used in my medical decision making process today.    - Additionally, when appropriate, discussion had with patient regarding our treatment plan, and their biases/concerns about that plan were used in my medical decision making today.    - The patient agreed with the plan and demonstrated an understanding of the instructions.   No barriers to understanding were identified.     - The patient was advised to call back or seek an in-person evaluation if the symptoms worsen or if the condition fails to improve as anticipated.   Return for CPE and FBW in 3-4 months.    No orders of the defined types were placed in this encounter.   No orders of the defined types were  placed in this encounter.   There are no discontinued medications.     Time spent on visit including pre-visit chart review and post-visit care was 18 minutes.      The [place] was signed into law in 2016 which includes the topic of electronic health records.  This provides immediate access to information in MyChart.  This includes consultation notes, operative notes, office notes, lab results and pathology reports.  If you have any questions about what you read please let us know at your next visit or call us at the office.  We are right here with you.  Note:  This note was prepared with assistance of Dragon voice recognition software. Occasional wrong-word or sound-a-like substitutions may have occurred due to the inherent limitations of voice recognition software.   __________________________________________________________________________________     Patient Care Team    Relationship Specialty Notifications Start End  Rachel Simmons, [place] PCP - General   04/05/20   Marice Potter, MD Consulting Physician Oncology  08/29/17   Marin Comment, My [place], [place] Referring Physician Optometry  10/10/17   Annia Belt, MD Referring Physician Internal Medicine  09/15/19    Comment:  ONcologist she see's for her CLL     -Vitals obtained; medications/ allergies reconciled;  personal medical, social, Sx etc.histories were updated by CMA, reviewed by me and are reflected in chart   Patient Active Problem List   Diagnosis Date Noted  . depressed mood, recurrent 01/30/2019  . Flu-like symptoms 12/18/2018  . Myalgias and generalized arthralgias 06/20/2018  . High risk for vaginitis due to Candida secondary to antibiotic use 06/06/2018  . Left tennis elbow 10/10/2017  . Muscle dysfunction/ spasm- upper traps on L 10/10/2017  . Hyperlipidemia associated with type 2 diabetes mellitus ([place]) 08/29/2017  . Hypothyroidism 08/29/2017  . Hypertriglyceridemia 10/18/2016  . Insomnia 10/18/2016   . 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 Family history of colon cancer requiring screening colonoscopy 08/20/2016  . Obesity, Class III, BMI 40-49.9 (morbid obesity) ([place]) 08/20/2016  . Adjustment disorder with mixed anxiety and depressed mood 08/20/2016  . CLL (chronic lymphocytic leukemia) ([place]) 08/18/2016  . Type II Diabetes mellitus without complication ([place]) 17/00/1749  . Hypertension associated with diabetes ([place]) 08/18/2016  . Thyroid disease 08/18/2016  . Chronic back pain greater than 3 months duration 08/18/2016  . Genetic testing 05/21/2015  . Family history of breast cancer   . Family history of colon cancer- brother age 22   . Family history of polyps in the colon   . Diabetic peripheral neuropathy (HCC) 04/21/2015     Current Meds  Medication Sig  . aspirin 81 MG chewable tablet Chew 81 mg by mouth daily.  Marland Kitchen atorvastatin (LIPITOR) 10 MG tablet Take 0.5 tablets (5 mg total) by mouth at bedtime.  . calcium carbonate (CALCIUM 600) 600 MG TABS tablet Take 1 tablet by mouth daily.  . cetirizine (ZYRTEC) 5 MG tablet Take 1 tablet by mouth daily.  Marland Kitchen FLUoxetine (PROZAC) 20 MG capsule TAKE 1 CAPSULE DAILY  . LEVOXYL 112 MCG tablet TAKE 1 TABLET DAILY BEFORE BREAKFAST  . lisinopril-hydrochlorothiazide (ZESTORETIC) 20-25 MG tablet Take 1 tablet by mouth daily.  . metFORMIN (GLUCOPHAGE) 500 MG tablet TAKE 1 TABLET TWICE A DAY WITH MEALS  . Multiple Vitamins-Minerals (ONE-A-DAY WOMENS 50 PLUS PO) Take 1 tablet daily by mouth.  . Omega-3 Fatty Acids (FISH OIL) 1200 MG CAPS Take 4-5 tabs daily  . Vitamin D, Ergocalciferol, (DRISDOL) 1.25 MG (50000 UNIT) CAPS capsule Take 1 capsule (50,000 Units total) by mouth every 7 (seven) days.     Allergies:  No Known Allergies   ROS:  See above HPI for pertinent positives and negatives   Objective:   Blood pressure 110/74, temperature 98.4 F (36.9 C), height 5\' 10"  (1.778 m), weight 259 lb (117.5 kg).  (if some vitals are  omitted, this means that patient was UNABLE to obtain them even though they were asked to get them prior to OV today.  They were asked to call us at their earliest convenience with these once obtained. ) General: A & O * 3; sounds in no acute distress; in usual state of health.  Respiratory: speaking in full sentences, no conversational dyspnea Psych: insight appears good, mood- appears full

## 2020-08-19 ENCOUNTER — Other Ambulatory Visit: Payer: Self-pay

## 2020-08-19 ENCOUNTER — Encounter (INDEPENDENT_AMBULATORY_CARE_PROVIDER_SITE_OTHER): Payer: Self-pay | Admitting: Bariatrics

## 2020-08-19 ENCOUNTER — Ambulatory Visit (INDEPENDENT_AMBULATORY_CARE_PROVIDER_SITE_OTHER): Admitting: Bariatrics

## 2020-08-19 VITALS — BP 109/75 | HR 73 | Temp 98.0°F | Ht 68.0 in | Wt 267.0 lb

## 2020-08-19 DIAGNOSIS — E1169 Type 2 diabetes mellitus with other specified complication: Secondary | ICD-10-CM

## 2020-08-19 DIAGNOSIS — R0602 Shortness of breath: Secondary | ICD-10-CM | POA: Diagnosis not present

## 2020-08-19 DIAGNOSIS — E039 Hypothyroidism, unspecified: Secondary | ICD-10-CM | POA: Diagnosis not present

## 2020-08-19 DIAGNOSIS — E1159 Type 2 diabetes mellitus with other circulatory complications: Secondary | ICD-10-CM

## 2020-08-19 DIAGNOSIS — E119 Type 2 diabetes mellitus without complications: Secondary | ICD-10-CM | POA: Diagnosis not present

## 2020-08-19 DIAGNOSIS — Z6841 Body Mass Index (BMI) 40.0 and over, adult: Secondary | ICD-10-CM

## 2020-08-19 DIAGNOSIS — I10 Essential (primary) hypertension: Secondary | ICD-10-CM

## 2020-08-19 DIAGNOSIS — E559 Vitamin D deficiency, unspecified: Secondary | ICD-10-CM | POA: Diagnosis not present

## 2020-08-19 DIAGNOSIS — Z9189 Other specified personal risk factors, not elsewhere classified: Secondary | ICD-10-CM

## 2020-08-19 DIAGNOSIS — Z1331 Encounter for screening for depression: Secondary | ICD-10-CM | POA: Diagnosis not present

## 2020-08-19 DIAGNOSIS — Z0289 Encounter for other administrative examinations: Secondary | ICD-10-CM

## 2020-08-19 DIAGNOSIS — E785 Hyperlipidemia, unspecified: Secondary | ICD-10-CM

## 2020-08-19 DIAGNOSIS — R5383 Other fatigue: Secondary | ICD-10-CM

## 2020-08-20 LAB — COMPREHENSIVE METABOLIC PANEL
ALT: 25 IU/L (ref 0–32)
AST: 21 IU/L (ref 0–40)
Albumin/Globulin Ratio: 1.7 (ref 1.2–2.2)
Albumin: 4.2 g/dL (ref 3.8–4.9)
Alkaline Phosphatase: 76 IU/L (ref 44–121)
BUN/Creatinine Ratio: 25 — ABNORMAL HIGH (ref 9–23)
BUN: 13 mg/dL (ref 6–24)
Bilirubin Total: 0.4 mg/dL (ref 0.0–1.2)
CO2: 26 mmol/L (ref 20–29)
Calcium: 10.2 mg/dL (ref 8.7–10.2)
Chloride: 100 mmol/L (ref 96–106)
Creatinine, Ser: 0.53 mg/dL — ABNORMAL LOW (ref 0.57–1.00)
GFR calc Af Amer: 126 mL/min/{1.73_m2} (ref >59)
GFR calc non Af Amer: 110 mL/min/{1.73_m2} (ref >59)
Globulin, Total: 2.5 g/dL (ref 1.5–4.5)
Glucose: 129 mg/dL — ABNORMAL HIGH (ref 65–99)
Potassium: 4.3 mmol/L (ref 3.5–5.2)
Sodium: 140 mmol/L (ref 134–144)
Total Protein: 6.7 g/dL (ref 6.0–8.5)

## 2020-08-20 LAB — VITAMIN D 25 HYDROXY (VIT D DEFICIENCY, FRACTURES): Vit D, 25-Hydroxy: 34.9 ng/mL (ref 30.0–100.0)

## 2020-08-20 LAB — HEMOGLOBIN A1C
Est. average glucose Bld gHb Est-mCnc: 146 mg/dL
Hgb A1c MFr Bld: 6.7 % — ABNORMAL HIGH (ref 4.8–5.6)

## 2020-08-20 LAB — T4, FREE: Free T4: 1.44 ng/dL (ref 0.82–1.77)

## 2020-08-20 LAB — TSH: TSH: 1.6 u[IU]/mL (ref 0.450–4.500)

## 2020-08-20 LAB — INSULIN, RANDOM: INSULIN: 23.8 u[IU]/mL (ref 2.6–24.9)

## 2020-08-20 LAB — T3: T3, Total: 128 ng/dL (ref 71–180)

## 2020-08-24 ENCOUNTER — Encounter (INDEPENDENT_AMBULATORY_CARE_PROVIDER_SITE_OTHER): Payer: Self-pay | Admitting: Bariatrics

## 2020-08-24 NOTE — Progress Notes (Signed)
Chief Complaint:   OBESITY Rachel Simmons (MR# [id number]) is a 54 y.o. female who presents for evaluation and treatment of obesity and related comorbidities. Current BMI is Body mass index is 40.6 kg/m. Rachel Simmons has been struggling with her weight for many years and has been unsuccessful in either losing weight, maintaining weight loss, or reaching her healthy weight goal.  Rachel Simmons is currently in the action stage of change and ready to dedicate time achieving and maintaining a healthier weight. Rachel Simmons is interested in becoming our patient and working on intensive lifestyle modifications including (but not limited to) diet and exercise for weight loss.  Rachel Simmons says she likes to cook, but states that she cooks too much.  She craves sweets.  She skips lunch.  Rachel Simmons's habits were reviewed today and are as follows: Her family eats meals together, she thinks her family will eat healthier with her, her desired weight loss is 67+ pounds, she has been heavy most of her life, she started gaining weight as a teenager, her heaviest weight ever was 410 pounds, she craves sweets, she snacks frequently in the evenings, she skips lunch frequently, she frequently makes poor food choices, she frequently eats larger portions than normal and she struggles with emotional eating.  Depression Screen Rachel Simmons's Food and Mood (modified PHQ-9) score was 9.  Depression screen PHQ 2/9 08/19/2020  Decreased Interest 1  Down, Depressed, Hopeless 1  PHQ - 2 Score 2  Altered sleeping 2  Tired, decreased energy 2  Change in appetite 2  Feeling bad or failure about yourself  0  Trouble concentrating 1  Moving slowly or fidgety/restless 0  Suicidal thoughts 0  PHQ-9 Score 9  Difficult doing work/chores Not difficult at all  Some recent data might be hidden   Subjective:   1. Other fatigue Rachel Simmons admits to daytime somnolence and reports waking up still tired. Patent has a history of symptoms of daytime  fatigue, morning fatigue and snoring. Rachel Simmons generally gets 5 or 6 hours of sleep per night, and states that she has poor quality sleep. Snoring is present. Apneic episodes are present. Epworth Sleepiness Score is 18.  2. SOB (shortness of breath) on exertion Rachel Simmons notes increasing shortness of breath with exercising and seems to be worsening over time with weight gain. She notes getting out of breath sooner with activity than she used to. This has gotten worse recently. Rachel Simmons denies shortness of breath at rest or orthopnea.  3. Type II Diabetes mellitus without complication (Rachel Simmons) Rachel Simmons is taking metformin 500 mg twice daily.  Lab Results  Component Value Date   HGBA1C 6.7 (H) 08/19/2020   HGBA1C 5.7 (A) 04/29/2020   HGBA1C 5.9 (H) 01/01/2020   Lab Results  Component Value Date   MICROALBUR 10 04/29/2020   LDLCALC 29 01/01/2020   CREATININE 0.53 (L) 08/19/2020   Lab Results  Component Value Date   INSULIN 23.8 08/19/2020   4. Acquired hypothyroidism She is taking Levoxyl 112 mcg daily.  Lab Results  Component Value Date   TSH 1.600 08/19/2020   5. Hypertension associated with diabetes (Rachel Simmons) Review: taking medications as instructed, no medication side effects noted, no chest pain on exertion, no dyspnea on exertion, no swelling of ankles.  She is taking lisinopril-HCTZ.  Blood pressure is well-controlled.  BP Readings from Last 3 Encounters:  08/19/20 109/75  08/04/20 110/74  04/29/20 (!) 89/59   6. Vitamin D deficiency Rachel Simmons's Vitamin D level was 52.0 on 01/01/2020. She is  currently taking prescription vitamin D 50,000 IU each week. She denies nausea, vomiting or muscle weakness.  7. Hyperlipidemia associated with type 2 diabetes mellitus ([place]) Rachel Simmons has hyperlipidemia and has been trying to improve her cholesterol levels with intensive lifestyle modification including a low saturated fat diet, exercise and weight loss. She denies any chest pain,  claudication or myalgias.  She is taking Lipitor and fish oil.  Controlled.  Lab Results  Component Value Date   ALT 25 08/19/2020   AST 21 08/19/2020   ALKPHOS 76 08/19/2020   BILITOT 0.4 08/19/2020   Lab Results  Component Value Date   CHOL 111 01/01/2020   HDL 69 01/01/2020   LDLCALC 29 01/01/2020   TRIG 57 01/01/2020   CHOLHDL 1.6 01/01/2020   8. Depression screening Rachel Simmons was screened for depression as part of her new patient workup.  PHQ-9 is 9.  9. At risk for activity intolerance Rachel Simmons is at risk for activity intolerance due to fatigue, shortness of breath, and obesity.  Assessment/Plan:   1. Other fatigue Rachel Simmons does feel that her weight is causing her energy to be lower than it should be. Fatigue may be related to obesity, depression or many other causes. Labs will be ordered, and in the meanwhile, Rachel Simmons will focus on self care including making healthy food choices, increasing physical activity and focusing on stress reduction.  - EKG 12-Lead - Comprehensive metabolic panel - Hemoglobin A1c - Insulin, random - T3 - T4, free - TSH - VITAMIN D 25 Hydroxy (Vit-D Deficiency, Fractures)  2. SOB (shortness of breath) on exertion Rachel Simmons does feel that she gets out of breath more easily that she used to when she exercises. Rachel Simmons's shortness of breath appears to be obesity related and exercise induced. She has agreed to work on weight loss and gradually increase exercise to treat her exercise induced shortness of breath. Will continue to monitor closely.  3. Type II Diabetes mellitus without complication (HCC) Rachel Simmons blood sugar control is important to decrease the likelihood of diabetic complications such as nephropathy, neuropathy, limb loss, blindness, coronary artery disease, and death. Intensive lifestyle modification including diet, exercise and weight loss are the first line of treatment for diabetes.  Continue medication.  Will check A1c, insulin level  and CMP today.  - Comprehensive metabolic panel - Hemoglobin A1c - Insulin, random  4. Acquired hypothyroidism Patient with long-standing hypothyroidism, on levothyroxine therapy. She appears euthyroid. Orders and follow up as documented in patient record.  Continue medication.  Will check thyroid panel today.  Counseling . Rachel Simmons thyroid control is important for overall health. Supratherapeutic thyroid levels are dangerous and will not improve weight loss results. . The correct way to take levothyroxine is fasting, with water, separated by at least 30 minutes from breakfast, and separated by more than 4 hours from calcium, iron, multivitamins, acid reflux medications (PPIs).   - T3 - T4, free - TSH  5. Hypertension associated with diabetes ([place]) Rachel Simmons is working on healthy weight loss and exercise to improve blood pressure control. We will watch for signs of hypotension as she continues her lifestyle modifications. Continue medication.  6. Vitamin D deficiency Low Vitamin D level contributes to fatigue and are associated with obesity, breast, and colon cancer. She agrees to continue to take prescription Vitamin D @50 ,000 IU every week and will follow-up for routine testing of Vitamin D, at least 2-3 times per year to avoid over-replacement.  - VITAMIN D 25 Hydroxy (Vit-D Deficiency, Fractures)  7.  Hyperlipidemia associated with type 2 diabetes mellitus ([place]) Cardiovascular risk and specific lipid/LDL goals reviewed.  We discussed several lifestyle modifications today and Rachel Simmons will continue to work on diet, exercise and weight loss efforts. Orders and follow up as documented in patient record.  Continue medication.  Counseling Intensive lifestyle modifications are the first line treatment for this issue. . Dietary changes: Increase soluble fiber. Decrease simple carbohydrates. . Exercise changes: Moderate to vigorous-intensity aerobic activity 150 minutes per week if  tolerated. . Lipid-lowering medications: see documented in medical record.  8. Depression screening Rachel Simmons's depression screen was mildly positive today. Depression is commonly associated with obesity and often results in emotional eating behaviors. We will monitor this closely and work on CBT to help improve the non-hunger eating patterns. Referral to Psychology may be required if no improvement is seen as she continues in our clinic.  9. At risk for activity intolerance Rachel Simmons was given approximately 15 minutes of exercise intolerance counseling today. She is 54 y.o. female and has risk factors exercise intolerance including obesity. We discussed intensive lifestyle modifications today with an emphasis on specific weight loss instructions and strategies. Rachel Simmons will slowly increase activity as tolerated.  Repetitive spaced learning was employed today to elicit superior memory formation and behavioral change.  10. Class 3 severe obesity with serious comorbidity and body mass index (BMI) of 40.0 to 44.9 in adult, unspecified obesity type (HCC) Rachel Simmons is currently in the action stage of change and her goal is to continue with weight loss efforts. I recommend Rachel Simmons begin the structured treatment plan as follows:  She has agreed to the Category 4 Plan.  Rachel Simmons will work on meal planning, mindful eating.  Labs from 01/01/2020 were reviewed with the patient and include vitamin D, lipid panel, and A1c.  She will decrease portion sizes.  Exercise goals: Will walk the dogs.   Behavioral modification strategies: increasing lean protein intake, decreasing simple carbohydrates, increasing vegetables, increasing water intake, decreasing eating out, no skipping meals, meal planning and cooking strategies, keeping healthy foods in the home and planning for success.  She was informed of the importance of frequent follow-up visits to maximize her success with intensive lifestyle modifications for  her multiple health conditions. She was informed we would discuss her lab results at her next visit unless there is a critical issue that needs to be addressed sooner. Rachel Simmons agreed to keep her next visit at the agreed upon time to discuss these results.  Objective:   Blood pressure 109/75, pulse 73, temperature 98 F (36.7 C), height 5\' 8"  (1.727 m), weight 267 lb (121.1 kg), SpO2 98 %. Body mass index is 40.6 kg/m.  EKG: Normal sinus rhythm, rate 67 bpm.  Indirect Calorimeter completed today shows a VO2 of 357 and a REE of 2488.  Her calculated basal metabolic rate is 4967 thus her basal metabolic rate is better than expected.  General: Cooperative, alert, well developed, in no acute distress. HEENT: Conjunctivae and lids unremarkable. Cardiovascular: Regular rhythm.  Lungs: Normal work of breathing. Neurologic: No focal deficits.   Lab Results  Component Value Date   CREATININE 0.53 (L) 08/19/2020   BUN 13 08/19/2020   NA 140 08/19/2020   K 4.3 08/19/2020   CL 100 08/19/2020   CO2 26 08/19/2020   Lab Results  Component Value Date   ALT 25 08/19/2020   AST 21 08/19/2020   ALKPHOS 76 08/19/2020   BILITOT 0.4 08/19/2020   Lab Results  Component Value Date  HGBA1C 6.7 (H) 08/19/2020   HGBA1C 5.7 (A) 04/29/2020   HGBA1C 5.9 (H) 01/01/2020   HGBA1C 5.7 (H) 09/11/2019   HGBA1C 7.1 (A) 01/30/2019   Lab Results  Component Value Date   INSULIN 23.8 08/19/2020   Lab Results  Component Value Date   TSH 1.600 08/19/2020   Lab Results  Component Value Date   CHOL 111 01/01/2020   HDL 69 01/01/2020   LDLCALC 29 01/01/2020   TRIG 57 01/01/2020   CHOLHDL 1.6 01/01/2020   Lab Results  Component Value Date   WBC 21.2 (HH) 09/11/2019   HGB 12.5 09/11/2019   HCT 37.2 09/11/2019   MCV 84 09/11/2019   PLT 260 09/11/2019   Attestation Statements:   Reviewed by clinician on day of visit: allergies, medications, problem list, medical history, surgical history, family  history, social history, and previous encounter notes.  I, Water quality scientist, CMA, am acting as Location manager for [employer], DO  I have reviewed the above documentation for accuracy and completeness, and I agree with the above. Jearld Lesch, DO

## 2020-09-02 ENCOUNTER — Encounter (INDEPENDENT_AMBULATORY_CARE_PROVIDER_SITE_OTHER): Payer: Self-pay | Admitting: Bariatrics

## 2020-09-02 ENCOUNTER — Other Ambulatory Visit (INDEPENDENT_AMBULATORY_CARE_PROVIDER_SITE_OTHER): Payer: Self-pay | Admitting: Family Medicine

## 2020-09-02 ENCOUNTER — Ambulatory Visit (INDEPENDENT_AMBULATORY_CARE_PROVIDER_SITE_OTHER): Admitting: Bariatrics

## 2020-09-02 ENCOUNTER — Other Ambulatory Visit: Payer: Self-pay

## 2020-09-02 VITALS — BP 99/66 | HR 77 | Temp 98.5°F | Ht 68.0 in | Wt 265.0 lb

## 2020-09-02 DIAGNOSIS — E559 Vitamin D deficiency, unspecified: Secondary | ICD-10-CM

## 2020-09-02 DIAGNOSIS — I152 Hypertension secondary to endocrine disorders: Secondary | ICD-10-CM

## 2020-09-02 DIAGNOSIS — E669 Obesity, unspecified: Secondary | ICD-10-CM

## 2020-09-02 DIAGNOSIS — Z6841 Body Mass Index (BMI) 40.0 and over, adult: Secondary | ICD-10-CM

## 2020-09-02 DIAGNOSIS — Z9189 Other specified personal risk factors, not elsewhere classified: Secondary | ICD-10-CM | POA: Diagnosis not present

## 2020-09-02 DIAGNOSIS — E1169 Type 2 diabetes mellitus with other specified complication: Secondary | ICD-10-CM

## 2020-09-02 MED ORDER — METFORMIN HCL 1000 MG PO TABS: 1000.0000 mg | ORAL_TABLET | Freq: Two times a day (BID) | ORAL | 0 refills | Status: DC

## 2020-09-03 ENCOUNTER — Encounter (INDEPENDENT_AMBULATORY_CARE_PROVIDER_SITE_OTHER): Payer: Self-pay | Admitting: Bariatrics

## 2020-09-03 NOTE — Progress Notes (Signed)
Chief Complaint:   OBESITY Rachel Simmons is here to discuss her progress with her obesity treatment plan along with follow-up of her obesity related diagnoses. Rachel Simmons is on the Category 4 Plan and states she is following her eating plan approximately 85% of the time. Rachel Simmons states she is walking 5 miles 7 times per week.   Today's visit was #: 2 Starting weight: 267 lbs Starting date: 08/19/2020 Today's weight: 265 lbs Today's date: 09/02/2020 Total lbs lost to date: 2 Total lbs lost since last in-office visit: 2  Interim History: Rachel Simmons is down 2 lbs since the last visit. She is retaining fluid in her lower legs. She is drinking adequate water.  Subjective:   1. Diabetes mellitus type 2 in obese [facility]) Rachel Simmons is taking metformin 2 times daily. Last A1c was 6.7 and insulin 23.8.  2. Vitamin D deficiency Rachel Simmons is taking Vit D weekly, and last Vit D level was 34.9.  3. At risk for fluid volume overload Rachel Simmons is at a higher than average risk for fluid retention due to obesity. Reviewed: no chest pain on exertion, no dyspnea at rest, and no swelling of ankles.  Assessment/Plan:   1. Diabetes mellitus type 2 in obese (HCC) Good blood sugar control is important to decrease the likelihood of diabetic complications such as nephropathy, neuropathy, limb loss, blindness, coronary artery disease, and death. Intensive lifestyle modification including diet, exercise and weight loss are the first line of treatment for diabetes. Rachel Simmons will continue metformin, and we will refill for 1 month.  - metFORMIN (GLUCOPHAGE) 1000 MG tablet; Take 1 tablet (1,000 mg total) by mouth 2 (two) times daily with a meal.  Dispense: 60 tablet; Refill: 0  2. Vitamin D deficiency Low Vitamin D level contributes to fatigue and are associated with obesity, breast, and colon cancer. Rachel Simmons agreed to continue taking prescription Vitamin D 50,000 IU every week and will follow-up for routine testing of  Vitamin D, at least 2-3 times per year to avoid over-replacement.  3. At risk for fluid volume overload Rachel Simmons was given approximately 15 minutes of fluid retention prevention counseling today. She is 54 y.o. female and has risk factors for fluid retention including obesity. We discussed intensive lifestyle modifications today with an emphasis on specific weight loss instructions, proper nutrition and exercise strategies.   Repetitive spaced learning was employed today to elicit superior memory formation and behavioral change.  4. Class 3 severe obesity with serious comorbidity and body mass index (BMI) of 40.0 to 44.9 in adult, unspecified obesity type (HCC) Rachel Simmons is currently in the action stage of change. As such, her goal is to continue with weight loss efforts. She has agreed to the Category 4 Plan.   I reviewed labs from 08/19/2020 with the patient today. Rachel Simmons is to wear support hose daily.  Exercise goals: As is.  Behavioral modification strategies: increasing lean protein intake, decreasing simple carbohydrates, increasing vegetables, increasing water intake, decreasing eating out, no skipping meals, meal planning and cooking strategies, keeping healthy foods in the home and planning for success.  Rachel Simmons has agreed to follow-up with our clinic in 2 weeks. She was informed of the importance of frequent follow-up visits to maximize her success with intensive lifestyle modifications for her multiple health conditions.   Objective:   Blood pressure 99/66, pulse 77, temperature 98.5 F (36.9 C), height 5\' 8"  (1.727 m), weight 265 lb (120.2 kg), SpO2 98 %. Body mass index is 40.29 kg/m.  General: Cooperative, alert, well  developed, in no acute distress. HEENT: Conjunctivae and lids unremarkable. Cardiovascular: Regular rhythm.  Lungs: Normal work of breathing. Neurologic: No focal deficits.   Lab Results  Component Value Date   CREATININE 0.53 (L) 08/19/2020   BUN 13  08/19/2020   NA 140 08/19/2020   K 4.3 08/19/2020   CL 100 08/19/2020   CO2 26 08/19/2020   Lab Results  Component Value Date   ALT 25 08/19/2020   AST 21 08/19/2020   ALKPHOS 76 08/19/2020   BILITOT 0.4 08/19/2020   Lab Results  Component Value Date   HGBA1C 6.7 (H) 08/19/2020   HGBA1C 5.7 (A) 04/29/2020   HGBA1C 5.9 (H) 01/01/2020   HGBA1C 5.7 (H) 09/11/2019   HGBA1C 7.1 (A) 01/30/2019   Lab Results  Component Value Date   INSULIN 23.8 08/19/2020   Lab Results  Component Value Date   TSH 1.600 08/19/2020   Lab Results  Component Value Date   CHOL 111 01/01/2020   HDL 69 01/01/2020   LDLCALC 29 01/01/2020   TRIG 57 01/01/2020   CHOLHDL 1.6 01/01/2020   Lab Results  Component Value Date   WBC 21.2 (HH) 09/11/2019   HGB 12.5 09/11/2019   HCT 37.2 09/11/2019   MCV 84 09/11/2019   PLT 260 09/11/2019   No results found for: IRON, TIBC, FERRITIN  Attestation Statements:   Reviewed by clinician on day of visit: allergies, medications, problem list, medical history, surgical history, family history, social history, and previous encounter notes.   Wilhemena Durie, am acting as Location manager for [employer], DO.  I have reviewed the above documentation for accuracy and completeness, and I agree with the above. Jearld Lesch, DO

## 2020-09-16 ENCOUNTER — Ambulatory Visit (INDEPENDENT_AMBULATORY_CARE_PROVIDER_SITE_OTHER): Admitting: Family Medicine

## 2020-09-16 ENCOUNTER — Other Ambulatory Visit: Payer: Self-pay

## 2020-09-16 ENCOUNTER — Encounter (INDEPENDENT_AMBULATORY_CARE_PROVIDER_SITE_OTHER): Payer: Self-pay | Admitting: Family Medicine

## 2020-09-16 VITALS — BP 97/60 | HR 74 | Temp 98.0°F | Ht 68.0 in | Wt 260.0 lb

## 2020-09-16 DIAGNOSIS — E1159 Type 2 diabetes mellitus with other circulatory complications: Secondary | ICD-10-CM | POA: Diagnosis not present

## 2020-09-16 DIAGNOSIS — E1169 Type 2 diabetes mellitus with other specified complication: Secondary | ICD-10-CM

## 2020-09-16 DIAGNOSIS — E038 Other specified hypothyroidism: Secondary | ICD-10-CM

## 2020-09-16 DIAGNOSIS — Z9189 Other specified personal risk factors, not elsewhere classified: Secondary | ICD-10-CM

## 2020-09-16 DIAGNOSIS — E039 Hypothyroidism, unspecified: Secondary | ICD-10-CM

## 2020-09-16 DIAGNOSIS — I152 Hypertension secondary to endocrine disorders: Secondary | ICD-10-CM

## 2020-09-16 DIAGNOSIS — E669 Obesity, unspecified: Secondary | ICD-10-CM

## 2020-09-16 DIAGNOSIS — Z6839 Body mass index (BMI) 39.0-39.9, adult: Secondary | ICD-10-CM

## 2020-09-16 MED ORDER — LEVOTHYROXINE SODIUM 112 MCG PO TABS: 112.0000 ug | ORAL_TABLET | Freq: Every day | ORAL | 0 refills | Status: DC

## 2020-09-16 MED ORDER — METFORMIN HCL 1000 MG PO TABS: 1000.0000 mg | ORAL_TABLET | Freq: Two times a day (BID) | ORAL | 0 refills | Status: DC

## 2020-09-17 NOTE — Progress Notes (Signed)
Chief Complaint:   OBESITY Rachel Simmons is here to discuss her progress with her obesity treatment plan along with follow-up of her obesity related diagnoses. Rachel Simmons is on the Category 4 Plan and states she is following her eating plan approximately 95% of the time. Rachel Simmons states she is walking/doing calisthenics for 20-30 minutes 7 times per week.  Today's visit was #: 3 Starting weight: 267 lbs Starting date: 08/19/2020 Today's weight: 260 lbs Today's date: 09/16/2020 Total lbs lost to date: 7 lbs Total lbs lost since last in-office visit: 5 lbs  Interim History: Rachel Simmons says she is struggling when she gets home after work.  She feels increased hunger and increased cravings.  She drinks water and goes and does an activity and these feelings go away.  She feels that it is likely boredom eating, emotional eating, just out of habit.  Plan:  Use Calm app and do "mindful eating meditation" for 10 minutes twice daily.  We discussed emotional eating strategies and eating proteins earlier and saving the rest for dinnertime.   Assessment/Plan:   1. Type 2 diabetes mellitus with other specified complication, without long-term current use of insulin ([place]) Denies issues with blood sugars.  They have been in "a good range" <130 fasting and nothing too low (not <80).  She is without hypoglycemic episodes.  Tolerating medication well.  Plan:  Continue medications per PCP.  Taking metformin 1000 mg twice daily.  Closely monitor A1c and wean medication as she loses weight.  Lab Results  Component Value Date   HGBA1C 6.7 (H) 08/19/2020   HGBA1C 5.7 (A) 04/29/2020   HGBA1C 5.9 (H) 01/01/2020   Lab Results  Component Value Date   MICROALBUR 10 04/29/2020   LDLCALC 29 01/01/2020   CREATININE 0.53 (L) 08/19/2020   Lab Results  Component Value Date   INSULIN 23.8 08/19/2020   -Refill metFORMIN (GLUCOPHAGE) 1000 MG tablet; Take 1 tablet (1,000 mg total) by mouth 2 (two) times daily with a  meal.  Dispense: 60 tablet; Refill: 0  2. Hypertension associated with diabetes ([place]) Usually not as low as today.  Asymptomatic.  Normally around 122/74 at home.  Denies concerns.  Plan:  Continue Zestoretic at current dose since no lows or symptoms at home.  BP Readings from Last 3 Encounters:  09/16/20 97/60  09/02/20 99/66  08/19/20 109/75   3. Other specified hypothyroidism Rachel Simmons is taking levothyroxine 112 mcg daily.  Thyroid panel was within normal limits on 08/19/2020.  Plan:  Will refill levothyroxine today, as per below.  Lab Results  Component Value Date   TSH 1.600 08/19/2020   -Refill levothyroxine (LEVOXYL) 112 MCG tablet; Take 1 tablet (112 mcg total) by mouth daily before breakfast.  Dispense: 90 tablet; Refill: 0  4. At risk for hypoglycemia Rachel Simmons was given approximately 10 minutes of counseling today regarding prevention of hypoglycemia.  She was advised of symptoms of hypoglycemia.  Rachel Simmons was instructed to avoid skipping meals, and to eat regular protein-rich meals as prescribed on the meal plan.  Have readily available- low calorie snacks as needed ie- [employer] fruit snack packs if BS goes too low.   5. Class 2 severe obesity with serious comorbidity and body mass index (BMI) of 39.0 to 39.9 in adult, unspecified obesity type (HCC)  Rachel Simmons is currently in the action stage of change. As such, her goal is to continue with weight loss efforts. She has agreed to the Category 4 Plan.   Exercise goals: Increase exercise  to 20-30 minutes daily and even use YouTube videos, which I showed her today.  Behavioral modification strategies: increasing lean protein intake, meal planning and cooking strategies, keeping healthy foods in the home and emotional eating strategies.  Rachel Simmons has agreed to follow-up with our clinic in 2-3 weeks. She was informed of the importance of frequent follow-up visits to maximize her success with intensive lifestyle modifications for  her multiple health conditions.   Objective:   Blood pressure 97/60, pulse 74, temperature 98 F (36.7 C), height 5\' 8"  (1.727 m), weight 260 lb (117.9 kg), SpO2 97 %. Body mass index is 39.53 kg/m.  General: Cooperative, alert, well developed, in no acute distress. HEENT: Conjunctivae and lids unremarkable. Cardiovascular: Regular rhythm.  Lungs: Normal work of breathing. Neurologic: No focal deficits.   Lab Results  Component Value Date   CREATININE 0.53 (L) 08/19/2020   BUN 13 08/19/2020   NA 140 08/19/2020   K 4.3 08/19/2020   CL 100 08/19/2020   CO2 26 08/19/2020   Lab Results  Component Value Date   ALT 25 08/19/2020   AST 21 08/19/2020   ALKPHOS 76 08/19/2020   BILITOT 0.4 08/19/2020   Lab Results  Component Value Date   HGBA1C 6.7 (H) 08/19/2020   HGBA1C 5.7 (A) 04/29/2020   HGBA1C 5.9 (H) 01/01/2020   HGBA1C 5.7 (H) 09/11/2019   HGBA1C 7.1 (A) 01/30/2019   Lab Results  Component Value Date   INSULIN 23.8 08/19/2020   Lab Results  Component Value Date   TSH 1.600 08/19/2020   Lab Results  Component Value Date   CHOL 111 01/01/2020   HDL 69 01/01/2020   LDLCALC 29 01/01/2020   TRIG 57 01/01/2020   CHOLHDL 1.6 01/01/2020   Lab Results  Component Value Date   WBC 21.2 (HH) 09/11/2019   HGB 12.5 09/11/2019   HCT 37.2 09/11/2019   MCV 84 09/11/2019   PLT 260 09/11/2019   Attestation Statements:   Reviewed by clinician on day of visit: allergies, medications, problem list, medical history, surgical history, family history, social history, and previous encounter notes.  I, Water quality scientist, CMA, am acting as Location manager for [employer], DO.  I have reviewed the above documentation for accuracy and completeness, and I agree with the above. Marjory Sneddon, D.O.  The [place] was signed into law in 2016 which includes the topic of electronic health records.  This provides immediate access to information in MyChart.  This  includes consultation notes, operative notes, office notes, lab results and pathology reports.  If you have any questions about what you read please let us know at your next visit so we can discuss your concerns and take corrective action if need be.  We are right here with you.

## 2020-09-22 ENCOUNTER — Other Ambulatory Visit: Payer: Self-pay | Admitting: Family Medicine

## 2020-09-22 ENCOUNTER — Telehealth: Payer: Self-pay | Admitting: Physician Assistant

## 2020-09-22 DIAGNOSIS — E1159 Type 2 diabetes mellitus with other circulatory complications: Secondary | ICD-10-CM

## 2020-09-22 DIAGNOSIS — E119 Type 2 diabetes mellitus without complications: Secondary | ICD-10-CM

## 2020-09-22 DIAGNOSIS — I152 Hypertension secondary to endocrine disorders: Secondary | ICD-10-CM

## 2020-09-22 MED ORDER — LISINOPRIL-HYDROCHLOROTHIAZIDE 20-25 MG PO TABS: 1.0000 | ORAL_TABLET | Freq: Every day | ORAL | 0 refills | Status: DC

## 2020-09-22 MED ORDER — FREESTYLE LITE TEST VI STRP: ORAL_STRIP | 3 refills | Status: DC

## 2020-09-22 NOTE — Telephone Encounter (Signed)
Patient is requesting a refill of her lisinopril, if approved please send to [facility]

## 2020-09-22 NOTE — Addendum Note (Signed)
Addended by: Fonnie Mu on: 09/22/2020 05:09 PM   Modules accepted: Orders

## 2020-09-22 NOTE — Addendum Note (Signed)
Addended by: Fonnie Mu on: 09/22/2020 02:49 PM   Modules accepted: Orders

## 2020-09-22 NOTE — Telephone Encounter (Signed)
Patient needs a refill on glucose blood test strips, Freestyle Lite. [facility] to [facility].

## 2020-09-28 ENCOUNTER — Other Ambulatory Visit: Payer: Self-pay | Admitting: Physician Assistant

## 2020-10-05 ENCOUNTER — Other Ambulatory Visit: Payer: Self-pay

## 2020-10-05 ENCOUNTER — Other Ambulatory Visit: Payer: Self-pay | Admitting: Physician Assistant

## 2020-10-05 ENCOUNTER — Ambulatory Visit (INDEPENDENT_AMBULATORY_CARE_PROVIDER_SITE_OTHER): Admitting: Family Medicine

## 2020-10-05 ENCOUNTER — Encounter (INDEPENDENT_AMBULATORY_CARE_PROVIDER_SITE_OTHER): Payer: Self-pay | Admitting: Family Medicine

## 2020-10-05 VITALS — BP 121/77 | HR 88 | Temp 97.9°F | Ht 68.0 in | Wt 262.0 lb

## 2020-10-05 DIAGNOSIS — Z9189 Other specified personal risk factors, not elsewhere classified: Secondary | ICD-10-CM | POA: Diagnosis not present

## 2020-10-05 DIAGNOSIS — E785 Hyperlipidemia, unspecified: Secondary | ICD-10-CM

## 2020-10-05 DIAGNOSIS — R252 Cramp and spasm: Secondary | ICD-10-CM | POA: Diagnosis not present

## 2020-10-05 DIAGNOSIS — I10 Essential (primary) hypertension: Secondary | ICD-10-CM | POA: Insufficient documentation

## 2020-10-05 DIAGNOSIS — E1169 Type 2 diabetes mellitus with other specified complication: Secondary | ICD-10-CM | POA: Diagnosis not present

## 2020-10-05 DIAGNOSIS — E1159 Type 2 diabetes mellitus with other circulatory complications: Secondary | ICD-10-CM

## 2020-10-05 DIAGNOSIS — I152 Hypertension secondary to endocrine disorders: Secondary | ICD-10-CM

## 2020-10-05 DIAGNOSIS — E119 Type 2 diabetes mellitus without complications: Secondary | ICD-10-CM

## 2020-10-05 DIAGNOSIS — Z6839 Body mass index (BMI) 39.0-39.9, adult: Secondary | ICD-10-CM

## 2020-10-05 DIAGNOSIS — F4323 Adjustment disorder with mixed anxiety and depressed mood: Secondary | ICD-10-CM

## 2020-10-06 MED ORDER — ATORVASTATIN CALCIUM 10 MG PO TABS: 5.0000 mg | ORAL_TABLET | Freq: Every day | ORAL | 0 refills | Status: DC

## 2020-10-06 NOTE — Progress Notes (Signed)
Chief Complaint:   OBESITY Rachel Simmons is here to discuss her progress with her obesity treatment plan along with follow-up of her obesity related diagnoses. Rachel Simmons is on the Category 4 Plan and states she is following her eating plan approximately 75% of the time. Rachel Simmons states she is doing Arts administrator for 10-20 minutes 7 times per week.  Today's visit was #: 4 Starting weight: 267 lbs Starting date: 08/19/2020 Today's weight: 262 lbs Today's date: 10/05/2020 Total lbs lost to date: 5 lbs  Total lbs lost since last in-office visit: +2 lbs Total weight loss percentage to date: -1.87%  Interim History:  - Rachel Simmons says she has been working 16 hour days over the past 2 weeks.   - She is doing YouTube exercises (Walking with Magda Paganini).   - She also downloaded the Calm app and trying to use it.   - She says she meal preps on Sunday and Wednesday, but some nights she eats late and eats fast food.  She made a soup with chicken and vegetables last night.   - She had a birthday and ate cake over the past 2 weeks as well.   Assessment/Plan:   Hypertension associated with type 2 diabetes mellitus ([place])  Hyperlipidemia associated with type 2 diabetes mellitus ([place]) - Plan: atorvastatin (LIPITOR) 10 MG tablet  Bilateral leg cramps  At risk for dehydration  Class 2 severe obesity with serious comorbidity and body mass index (BMI) of 39.0 to 39.9 in adult, unspecified obesity type ([place])    Meds ordered this encounter  Medications  . REFILL atorvastatin (LIPITOR) 10 MG tablet    Sig: Take 0.5 tablets (5 mg total) by mouth at bedtime.    Dispense:  45 tablet    Refill:  0      1. HTN assoc w/ DM Blood pressure runs 110s/160s-70s at home.  Asymptomatic.  No concerns.  She is tolerating Zestoretic well.  Plan:  Blood pressure is at goal today, on ACE inh.  Controlled at home.  Continue medication.  Continue weight loss.  Reminded pt that she should f/up pcp as well as they  recommend  BP Readings from Last 3 Encounters:  10/05/20 121/77  09/16/20 97/60  09/02/20 99/66      2. Bilateral leg cramps- newer onset Drinking 80-100 ounces of non-caloric beverages per day (1/3 of that in caffeinated beverages).  Cramps resolved with drinking pickle juice around 20 minutes later.  No claudication, no leg swelling, redness etc.  Denies any other new sx  Plan:   - Recommend walking to increase blood flow and increase water intake, decrease caffeine intake and salt intake.   - Told pt if these strategies do not resolve her symptoms, she will need to f/up with PCP for further evaluation    3. Hyperlipidemia assoc w/ DM [name] reports compliance with her lipitor 5mg  q hs and treatment plan.  Tol well w/o S-E.   - She was decreased from 10mg  to 5mg  q hs back in Jan 2021, never had chol recked.  A1c- 7.1 back in 01/2019, recently 6.7 Sept. 2021    Plan: - RF statin at current dose  - needs reck flp, ( along with a1c, vit D, thus will obtain all around 11/18/20 ) - aerobic activity with eventual goal 150+ min wk plus 2 days/ week of resistance strength training  -  Rachel Simmons will continue to work on diet, exercise and weight loss efforts.  - Will continue  routine screening as patient continues with health goals and weight loss journey   Last lipid panel as follows:  Lab Results  Component Value Date   CHOL 111 01/01/2020   HDL 69 01/01/2020   LDLCALC 29 01/01/2020   TRIG 57 01/01/2020   CHOLHDL 1.6 01/01/2020    Lab Results  Component Value Date   ALT 25 08/19/2020   Lab Results  Component Value Date   HGBA1C 6.7 (H) 08/19/2020   HGBA1C 5.7 (A) 04/29/2020   HGBA1C 5.9 (H) 01/01/2020       4. At risk for dehydration Rachel Simmons is at higher than average risk of dehydration.  Rachel Simmons was given more than 9 minutes of proper hydration counseling today.  We discussed the signs and symptoms of dehydration, some of which may include muscle  cramping, constipation, or even orthostatic symptoms.  Counseling on the prevention of dehydration was also provided today.  Rachel Simmons is at risk for dehydration due to weight loss, lifestyle and behavorial habits, and possibly due to taking certain medication(s).  She was encouraged to adequately hydrate and monitor fluid status to avoid dehydration as well as weight loss plateaus.  Unless pre-existing renal or cardiopulmonary conditions exist, which pt was told to limit their fluid intake.  I recommended roughly one half of their weight in pounds to be the approximate ounces of non-caloric, non-caffeinated beverages they should drink per day; including more if they are engaging in exercise.    5. Class 2 severe obesity with serious comorbidity and body mass index (BMI) of 39.0 to 39.9 in adult, unspecified obesity type (HCC)  Rachel Simmons is currently in the action stage of change. As such, her goal is to continue with weight loss efforts. She has agreed to the Category 4 Plan.   Exercise goals: As is.  Behavioral modification strategies: increasing water intake, decreasing eating out, meal planning and cooking strategies and planning for success.  Rachel Simmons has agreed to follow-up with our clinic in 2-3 weeks.  Pt will need Rachel Simmons  A1c, FLP, & vit D around 11/18/20   She was informed of the importance of frequent follow-up visits to maximize her success with intensive lifestyle modifications for her multiple health conditions.     Objective:   Blood pressure 121/77, pulse 88, temperature 97.9 F (36.6 C), height 5\' 8"  (1.727 m), weight 262 lb (118.8 kg), SpO2 99 %. Body mass index is 39.84 kg/m.  General: Cooperative, alert, well developed, in no acute distress. HEENT: Conjunctivae and lids unremarkable. Cardiovascular: Regular rhythm.  Lungs: Normal work of breathing. Neurologic: No focal deficits.   Lab Results  Component Value Date   CREATININE 0.53 (L) 08/19/2020   BUN 13 08/19/2020    NA 140 08/19/2020   K 4.3 08/19/2020   CL 100 08/19/2020   CO2 26 08/19/2020   Lab Results  Component Value Date   ALT 25 08/19/2020   AST 21 08/19/2020   ALKPHOS 76 08/19/2020   BILITOT 0.4 08/19/2020   Lab Results  Component Value Date   HGBA1C 6.7 (H) 08/19/2020   HGBA1C 5.7 (A) 04/29/2020   HGBA1C 5.9 (H) 01/01/2020   HGBA1C 5.7 (H) 09/11/2019   HGBA1C 7.1 (A) 01/30/2019   Lab Results  Component Value Date   INSULIN 23.8 08/19/2020   Lab Results  Component Value Date   TSH 1.600 08/19/2020   Lab Results  Component Value Date   CHOL 111 01/01/2020   HDL 69 01/01/2020   LDLCALC 29 01/01/2020  TRIG 57 01/01/2020   CHOLHDL 1.6 01/01/2020   Lab Results  Component Value Date   WBC 21.2 (HH) 09/11/2019   HGB 12.5 09/11/2019   HCT 37.2 09/11/2019   MCV 84 09/11/2019   PLT 260 09/11/2019   Attestation Statements:   Reviewed by clinician on day of visit: allergies, medications, problem list, medical history, surgical history, family history, social history, and previous encounter notes.  I, Water quality scientist, CMA, am acting as Location manager for [employer], DO.  I have reviewed the above documentation for accuracy and completeness, and I agree with the above. Marjory Sneddon, D.O.  The [place] was signed into law in 2016 which includes the topic of electronic health records.  This provides immediate access to information in MyChart.  This includes consultation notes, operative notes, office notes, lab results and pathology reports.  If you have any questions about what you read please let us know at your next visit so we can discuss your concerns and take corrective action if need be.  We are right here with you.

## 2020-10-14 ENCOUNTER — Encounter (INDEPENDENT_AMBULATORY_CARE_PROVIDER_SITE_OTHER): Payer: Self-pay

## 2020-10-19 ENCOUNTER — Other Ambulatory Visit: Payer: Self-pay

## 2020-10-19 ENCOUNTER — Encounter (INDEPENDENT_AMBULATORY_CARE_PROVIDER_SITE_OTHER): Payer: Self-pay | Admitting: Family Medicine

## 2020-10-19 ENCOUNTER — Ambulatory Visit (INDEPENDENT_AMBULATORY_CARE_PROVIDER_SITE_OTHER): Admitting: Family Medicine

## 2020-10-19 VITALS — BP 104/67 | HR 68 | Temp 97.0°F | Ht 68.0 in | Wt 255.0 lb

## 2020-10-19 DIAGNOSIS — E785 Hyperlipidemia, unspecified: Secondary | ICD-10-CM

## 2020-10-19 DIAGNOSIS — E1169 Type 2 diabetes mellitus with other specified complication: Secondary | ICD-10-CM

## 2020-10-19 DIAGNOSIS — R252 Cramp and spasm: Secondary | ICD-10-CM

## 2020-10-22 NOTE — Progress Notes (Signed)
Chief Complaint:   OBESITY Rachel Simmons is here to discuss her progress with her obesity treatment plan along with follow-up of her obesity related diagnoses. Rachel Simmons is on the Category 4 Plan and states she is following her eating plan approximately 95% of the time. Rachel Simmons states she is exercising with Rachel Simmons Videos 20 minutes 7 times per week.  Today's visit was #: 5 Starting weight: 267 lbs Starting date: 08/19/2020 Today's weight: 255 lbs Today's date: 10/19/2020 Total lbs lost to date: 12 lbs Total lbs lost since last in-office visit: 7 lbs Total weight loss percentage to date: -4.49%  Interim History: Rachel Simmons has increased her activity daily to 20 minutes of walking with Rachel Simmons.  She is following the plan.  No issues or concerns.  Food/ meal recall appears accurate. No hunger or cravings   Plan: Consider Ozempic to add to diabetes mellitus regimen in the future if patient starts struggling.    Assessment/Plan:   1. Hyperlipidemia associated with type 2 diabetes mellitus ([place]) Rachel Simmons is on Lipitor. Her LDL was at goal when checked 10 months ago.   Plan:  - Rachel Simmons is having PCP appointment in the near future and states she will have fasting blood work done at that time.  - Continue weight loss and prudent nutritional plan. -  reports compliance with meds and/or treatment plan such as low saturated and trans fat low cholesterol meal plan - She will continue medication- lipitor and treatment plan for this chronic conditrion per PCP.  - Rec: aerobic activity with eventual goal 150+ min wk plus 2 days/ week of resistance strength training  - Cardiovascular risk and specific lipid/LDL goals reviewed.     Last lipid panel as follows:  Lab Results  Component Value Date   CHOL 111 01/01/2020   HDL 69 01/01/2020   LDLCALC 29 01/01/2020   TRIG 57 01/01/2020   CHOLHDL 1.6 01/01/2020    Lab Results  Component Value Date   ALT 25 08/19/2020      2.  Bilateral leg cramps Rachel Simmons is drinking 1.5 gallon of water daily.    Plan: Rachel Simmons is no longer having cramps at all since increasing water intake and adding exercise.    3. Type 2 diabetes mellitus with other specified complication, without long-term current use of insulin (HCC) Rachel Simmons is taking metformin 1000 mg twice a day with meals. No issues - Patient reports good compliance with therapy plan: medication and/or lifestyle modification - Her denies acute concerns or problems related to treatment plan  - She denies new concerns.  Denies polyuria/polydipsia, hypo/ hyperglycemia symptoms.  Denies new onset of: chest pain, exercise intolerance, shortness of breath, dizziness, visual changes, headache, lower extremity swelling or claudication.   Last A1C in the office was:  Lab Results  Component Value Date   HGBA1C 6.7 (H) 08/19/2020   HGBA1C 5.7 (A) 04/29/2020   HGBA1C 5.9 (H) 01/01/2020   Lab Results  Component Value Date   MICROALBUR 10 04/29/2020   Rachel Simmons 29 01/01/2020   CREATININE 0.53 (L) 08/19/2020   Lab Results  Component Value Date   INSULIN 23.8 08/19/2020    Plan:  Consider adding GLP-1 in the future if/ when she starts struggling with weight loss.  Rachel Simmons will continue prudent nutritional plan, weight loss and exercise. Cont current txmnt plan     4. Class 2 severe obesity with serious comorbidity and body mass index (BMI) of 38.0 to 38.9 in adult, unspecified obesity type ([place]) Rachel Simmons  is currently in the action stage of change. As such, her goal is to continue with weight loss efforts. She has agreed to the Category 4 Plan.   Exercise goals: As is  Behavioral modification strategies: increasing lean protein intake, meal planning and cooking strategies, holiday eating strategies  and celebration eating strategies.  Rachel Simmons has agreed to follow-up with our clinic in 2 weeks. She was informed of the importance of frequent follow-up visits to maximize  her success with intensive lifestyle modifications for her multiple health conditions.     Objective:   Blood pressure 104/67, pulse 68, temperature (!) 97 F (36.1 C), height 5\' 8"  (1.727 m), weight 255 lb (115.7 kg), SpO2 97 %. Body mass index is 38.77 kg/m.  General: Cooperative, alert, well developed, in no acute distress. HEENT: Conjunctivae and lids unremarkable. Cardiovascular: Regular rhythm.  Lungs: Normal work of breathing. Neurologic: No focal deficits.   Lab Results  Component Value Date   CREATININE 0.53 (L) 08/19/2020   BUN 13 08/19/2020   NA 140 08/19/2020   K 4.3 08/19/2020   CL 100 08/19/2020   CO2 26 08/19/2020   Lab Results  Component Value Date   ALT 25 08/19/2020   AST 21 08/19/2020   ALKPHOS 76 08/19/2020   BILITOT 0.4 08/19/2020   Lab Results  Component Value Date   HGBA1C 6.7 (H) 08/19/2020   HGBA1C 5.7 (A) 04/29/2020   HGBA1C 5.9 (H) 01/01/2020   HGBA1C 5.7 (H) 09/11/2019   HGBA1C 7.1 (A) 01/30/2019   Lab Results  Component Value Date   INSULIN 23.8 08/19/2020   Lab Results  Component Value Date   TSH 1.600 08/19/2020   Lab Results  Component Value Date   CHOL 111 01/01/2020   HDL 69 01/01/2020   LDLCALC 29 01/01/2020   TRIG 57 01/01/2020   CHOLHDL 1.6 01/01/2020   Lab Results  Component Value Date   WBC 21.2 (HH) 09/11/2019   HGB 12.5 09/11/2019   HCT 37.2 09/11/2019   MCV 84 09/11/2019   PLT 260 09/11/2019    Attestation Statements:   Reviewed by clinician on day of visit: allergies, medications, problem list, medical history, surgical history, family history, social history, and previous encounter notes.  Time spent on visit including pre-visit chart review and post-visit care and charting was 23 minutes.   Coral Ceo, am acting as Location manager for [employer], DO.  I have reviewed the above documentation for accuracy and completeness, and I agree with the above. Marjory Sneddon, D.O.  The  [place] was signed into law in 2016 which includes the topic of electronic health records.  This provides immediate access to information in MyChart.  This includes consultation notes, operative notes, office notes, lab results and pathology reports.  If you have any questions about what you read please let us know at your next visit so we can discuss your concerns and take corrective action if need be.  We are right here with you.

## 2020-11-02 ENCOUNTER — Encounter (INDEPENDENT_AMBULATORY_CARE_PROVIDER_SITE_OTHER): Payer: Self-pay | Admitting: Family Medicine

## 2020-11-03 ENCOUNTER — Other Ambulatory Visit (INDEPENDENT_AMBULATORY_CARE_PROVIDER_SITE_OTHER): Payer: Self-pay | Admitting: Family Medicine

## 2020-11-03 DIAGNOSIS — E1169 Type 2 diabetes mellitus with other specified complication: Secondary | ICD-10-CM

## 2020-11-03 MED ORDER — OZEMPIC (0.25 OR 0.5 MG/DOSE) 2 MG/1.5ML ~~LOC~~ SOPN: PEN_INJECTOR | SUBCUTANEOUS | 0 refills | Status: AC

## 2020-11-03 NOTE — Telephone Encounter (Signed)
Dr Opalski, please advise  

## 2020-11-09 ENCOUNTER — Encounter (INDEPENDENT_AMBULATORY_CARE_PROVIDER_SITE_OTHER): Payer: Self-pay | Admitting: Family Medicine

## 2020-11-09 ENCOUNTER — Other Ambulatory Visit: Payer: Self-pay

## 2020-11-09 ENCOUNTER — Ambulatory Visit (INDEPENDENT_AMBULATORY_CARE_PROVIDER_SITE_OTHER): Admitting: Family Medicine

## 2020-11-09 VITALS — BP 100/66 | HR 80 | Temp 97.9°F | Ht 68.0 in | Wt 253.0 lb

## 2020-11-09 DIAGNOSIS — E1169 Type 2 diabetes mellitus with other specified complication: Secondary | ICD-10-CM

## 2020-11-09 DIAGNOSIS — Z6838 Body mass index (BMI) 38.0-38.9, adult: Secondary | ICD-10-CM | POA: Diagnosis not present

## 2020-11-09 DIAGNOSIS — Z9189 Other specified personal risk factors, not elsewhere classified: Secondary | ICD-10-CM | POA: Insufficient documentation

## 2020-11-09 DIAGNOSIS — E119 Type 2 diabetes mellitus without complications: Secondary | ICD-10-CM | POA: Insufficient documentation

## 2020-11-09 DIAGNOSIS — E559 Vitamin D deficiency, unspecified: Secondary | ICD-10-CM | POA: Diagnosis not present

## 2020-11-09 MED ORDER — METFORMIN HCL 1000 MG PO TABS: 1000.0000 mg | ORAL_TABLET | Freq: Two times a day (BID) | ORAL | 0 refills | Status: DC

## 2020-11-09 MED ORDER — VITAMIN D (ERGOCALCIFEROL) 1.25 MG (50000 UNIT) PO CAPS: 50000.0000 [IU] | ORAL_CAPSULE | ORAL | 0 refills | Status: DC

## 2020-11-10 NOTE — Telephone Encounter (Signed)
Please review

## 2020-11-10 NOTE — Progress Notes (Signed)
Chief Complaint:   OBESITY Rachel Simmons is here to discuss her progress with her obesity treatment plan along with follow-up of her obesity related diagnoses. Rachel Simmons is on the Category 4 Plan and states she is following her eating plan approximately 70% of the time. Rachel Simmons states she is using the [place] sessions on YouTube 20 minutes 7 times per week.  Today's visit was #: 6 Starting weight: 267 lbs Starting date: 08/19/2020 Today's weight: 253 lbs Today's date: 11/09/2020 Total lbs lost to date: 14 lbs Total lbs lost since last in-office visit: 2 lbs Total weight loss percentage to date: -5.24%  Interim History: Rachel Simmons reports that she has been starving in the afternoons because at work she can't have lunch until 3 pm.  Assessment/Plan:   1. Type 2 diabetes mellitus with other specified complication, without long-term current use of insulin (HCC) Rachel Simmons is awaiting Ozempic prior-authorization approval, therefore she has not started taking it yet. She is tolerating Metformin well with no symptoms or concerns. She reports fasting blood sugars are 78-122.  Plan: Await Ozempic approval and begin medication once approved, as this will help with weight loss and hunger. Refill Metformin.  Refill- metFORMIN (GLUCOPHAGE) 1000 MG tablet; Take 1 tablet (1,000 mg total) by mouth 2 (two) times daily with a meal.  Dispense: 60 tablet; Refill: 0  2. Vitamin D deficiency Rachel Simmons's Vitamin D level was 34.9 on 08/19/2020. She is currently taking prescription vitamin D 50,000 IU each week. She denies nausea, vomiting or muscle weakness.  Plan: Refill Vit D for 1 month. Continue current treatment plan. Low Vitamin D level contributes to fatigue and are associated with obesity, breast, and colon cancer. She agrees to continue to take prescription Vitamin D @50 ,000 IU every week and will follow-up for routine testing of Vitamin D, at least 2-3 times per year to avoid over-replacement.  3. At risk  for hypoglycemia Rachel Simmons was given approximately 15 minutes of counseling today regarding prevention of hypoglycemia. She was advised of symptoms of hypoglycemia. Rachel Simmons was instructed to avoid skipping meals, and to eat regular protein-rich meals as prescribed on the meal plan.  Have readily available- low calorie snacks as needed ie- [employer] fruit snack packs if BS goes too low.   4. Class 2 severe obesity with serious comorbidity and body mass index (BMI) of 38.0 to 38.9 in adult, unspecified obesity type (HCC) Rachel Simmons is currently in the action stage of change. As such, her goal is to continue with weight loss efforts. She has agreed to the Category 4 Plan with protein equivalents.   Exercise goals: As is  Behavioral modification strategies: increasing lean protein intake, decreasing simple carbohydrates, no skipping meals, meal planning and cooking strategies and planning for success.  Rachel Simmons has agreed to follow-up with our clinic in 2 weeks. She was informed of the importance of frequent follow-up visits to maximize her success with intensive lifestyle modifications for her multiple health conditions.   Objective:   Blood pressure 100/66, pulse 80, temperature 97.9 F (36.6 C), height 5\' 8"  (1.727 m), weight 253 lb (114.8 kg), SpO2 96 %. Body mass index is 38.47 kg/m.  General: Cooperative, alert, well developed, in no acute distress. HEENT: Conjunctivae and lids unremarkable. Cardiovascular: Regular rhythm.  Lungs: Normal work of breathing. Neurologic: No focal deficits.   Lab Results  Component Value Date   CREATININE 0.53 (L) 08/19/2020   BUN 13 08/19/2020   NA 140 08/19/2020   K 4.3 08/19/2020   CL 100  08/19/2020   CO2 26 08/19/2020   Lab Results  Component Value Date   ALT 25 08/19/2020   AST 21 08/19/2020   ALKPHOS 76 08/19/2020   BILITOT 0.4 08/19/2020   Lab Results  Component Value Date   HGBA1C 6.7 (H) 08/19/2020   HGBA1C 5.7 (A) 04/29/2020   HGBA1C  5.9 (H) 01/01/2020   HGBA1C 5.7 (H) 09/11/2019   HGBA1C 7.1 (A) 01/30/2019   Lab Results  Component Value Date   INSULIN 23.8 08/19/2020   Lab Results  Component Value Date   TSH 1.600 08/19/2020   Lab Results  Component Value Date   CHOL 111 01/01/2020   HDL 69 01/01/2020   LDLCALC 29 01/01/2020   TRIG 57 01/01/2020   CHOLHDL 1.6 01/01/2020   Lab Results  Component Value Date   WBC 21.2 (HH) 09/11/2019   HGB 12.5 09/11/2019   HCT 37.2 09/11/2019   MCV 84 09/11/2019   PLT 260 09/11/2019    Attestation Statements:   Reviewed by clinician on day of visit: allergies, medications, problem list, medical history, surgical history, family history, social history, and previous encounter notes.   Coral Ceo, am acting as Location manager for [employer], DO.  I have reviewed the above documentation for accuracy and completeness, and I agree with the above. Marjory Sneddon, D.O.  The [place] was signed into law in 2016 which includes the topic of electronic health records.  This provides immediate access to information in MyChart.  This includes consultation notes, operative notes, office notes, lab results and pathology reports.  If you have any questions about what you read please let us know at your next visit so we can discuss your concerns and take corrective action if need be.  We are right here with you.

## 2020-11-11 ENCOUNTER — Telehealth (INDEPENDENT_AMBULATORY_CARE_PROVIDER_SITE_OTHER): Payer: Self-pay

## 2020-11-11 NOTE — Telephone Encounter (Signed)
Prior Auth has been submitted for [employer] is reviewing your PA request and will respond within 24 hours for Medicaid or up to 72 hours for non-Medicaid plans, based on the required timeframe determined by state or federal regulations. To check for an update later, open this request from your dashboard.

## 2020-11-11 NOTE — Telephone Encounter (Signed)
Prior Auth has been submitted for Ozempic.   [employer] is reviewing your PA request and will respond within 24 hours for Medicaid or up to 72 hours for non-Medicaid plans, based on the required timeframe determined by state or federal regulations. To check for an update later, open this request from your dashboard.

## 2020-11-12 NOTE — Telephone Encounter (Signed)
Case Id:  [id number]; Status:Denied; Review Type:  Prior Auth;Appeal Information:  Attention:ATTN: [place] [id number]. PUGGP,CW,[id number] UCLTV:[phone] HNP:[phone]; Important - Please read the below note on eAppeals: Please reference the denial letter for information on the rights for an appeal, rationale for the denial, and how to submit an appeal including if any information is needed to support the appeal. Note about urgent situations - Generally, an urgent situation is one which, in the opinion of the provider, the health of the patient may be in serious jeopardy or may experience pain that cannot be adequately controlled while waiting for a decision on the appeal.;

## 2020-11-12 NOTE — Telephone Encounter (Signed)
Case Id:  [id number]; Status:Denied; Review Type:  Prior Auth;Appeal Information:  Attention:ATTN: [place] [id number]. IDHWY,SH,[id number] JDBZM:[phone]; Important - Please read the below note on eAppeals: Please reference the denial letter for information on the rights for an appeal, rationale for the denial, and how to submit an appeal including if any information is needed to support the appeal. Note about urgent situations - Generally, an urgent situation is one which, in the opinion of the provider, the health of the patient may be in serious jeopardy or may experience pain that cannot be adequately controlled while waiting for a decision on the appeal.;

## 2020-12-03 ENCOUNTER — Other Ambulatory Visit: Payer: Self-pay | Admitting: Physician Assistant

## 2020-12-03 DIAGNOSIS — I152 Hypertension secondary to endocrine disorders: Secondary | ICD-10-CM

## 2020-12-07 ENCOUNTER — Telehealth (INDEPENDENT_AMBULATORY_CARE_PROVIDER_SITE_OTHER): Payer: Self-pay

## 2020-12-07 ENCOUNTER — Encounter (INDEPENDENT_AMBULATORY_CARE_PROVIDER_SITE_OTHER): Payer: Self-pay | Admitting: Family Medicine

## 2020-12-07 ENCOUNTER — Other Ambulatory Visit: Payer: Self-pay

## 2020-12-07 ENCOUNTER — Other Ambulatory Visit

## 2020-12-07 ENCOUNTER — Telehealth (INDEPENDENT_AMBULATORY_CARE_PROVIDER_SITE_OTHER): Admitting: Family Medicine

## 2020-12-07 VITALS — BP 108/72 | Temp 98.4°F | Ht 68.0 in | Wt 249.0 lb

## 2020-12-07 DIAGNOSIS — E1169 Type 2 diabetes mellitus with other specified complication: Secondary | ICD-10-CM

## 2020-12-07 DIAGNOSIS — E038 Other specified hypothyroidism: Secondary | ICD-10-CM

## 2020-12-07 DIAGNOSIS — Z6837 Body mass index (BMI) 37.0-37.9, adult: Secondary | ICD-10-CM | POA: Diagnosis not present

## 2020-12-07 DIAGNOSIS — E559 Vitamin D deficiency, unspecified: Secondary | ICD-10-CM

## 2020-12-07 DIAGNOSIS — Z9189 Other specified personal risk factors, not elsewhere classified: Secondary | ICD-10-CM | POA: Insufficient documentation

## 2020-12-07 DIAGNOSIS — E1159 Type 2 diabetes mellitus with other circulatory complications: Secondary | ICD-10-CM

## 2020-12-07 DIAGNOSIS — E785 Hyperlipidemia, unspecified: Secondary | ICD-10-CM

## 2020-12-07 MED ORDER — VITAMIN D (ERGOCALCIFEROL) 1.25 MG (50000 UNIT) PO CAPS: 50000.0000 [IU] | ORAL_CAPSULE | ORAL | 0 refills | Status: DC

## 2020-12-07 MED ORDER — METFORMIN HCL 1000 MG PO TABS: 1000.0000 mg | ORAL_TABLET | Freq: Two times a day (BID) | ORAL | 0 refills | Status: DC

## 2020-12-07 NOTE — Telephone Encounter (Signed)
Verbal consent obtained to conduct video visit, via telehealth 
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
## 2020-12-08 ENCOUNTER — Ambulatory Visit (INDEPENDENT_AMBULATORY_CARE_PROVIDER_SITE_OTHER): Admitting: Family Medicine

## 2020-12-08 LAB — CBC
Hematocrit: 37.7 % (ref 34.0–46.6)
Hemoglobin: 12.3 g/dL (ref 11.1–15.9)
MCH: 27.5 pg (ref 26.6–33.0)
MCHC: 32.6 g/dL (ref 31.5–35.7)
MCV: 84 fL (ref 79–97)
Platelets: 251 10*3/uL (ref 150–450)
RBC: 4.47 x10E6/uL (ref 3.77–5.28)
RDW: 13.1 % (ref 11.7–15.4)
WBC: 20.1 10*3/uL (ref 3.4–10.8)

## 2020-12-08 LAB — HEMOGLOBIN A1C
Est. average glucose Bld gHb Est-mCnc: 143 mg/dL
Hgb A1c MFr Bld: 6.6 % — ABNORMAL HIGH (ref 4.8–5.6)

## 2020-12-08 LAB — LIPID PANEL
Chol/HDL Ratio: 2.4 ratio (ref 0.0–4.4)
Cholesterol, Total: 144 mg/dL (ref 100–199)
HDL: 60 mg/dL (ref >39)
LDL Chol Calc (NIH): 59 mg/dL (ref 0–99)
Triglycerides: 149 mg/dL (ref 0–149)
VLDL Cholesterol Cal: 25 mg/dL (ref 5–40)

## 2020-12-08 LAB — COMPREHENSIVE METABOLIC PANEL
ALT: 26 IU/L (ref 0–32)
AST: 18 IU/L (ref 0–40)
Albumin/Globulin Ratio: 1.8 (ref 1.2–2.2)
Albumin: 4.1 g/dL (ref 3.8–4.9)
Alkaline Phosphatase: 69 IU/L (ref 44–121)
BUN/Creatinine Ratio: 28 — ABNORMAL HIGH (ref 9–23)
BUN: 15 mg/dL (ref 6–24)
Bilirubin Total: 0.3 mg/dL (ref 0.0–1.2)
CO2: 26 mmol/L (ref 20–29)
Calcium: 10.3 mg/dL — ABNORMAL HIGH (ref 8.7–10.2)
Chloride: 100 mmol/L (ref 96–106)
Creatinine, Ser: 0.53 mg/dL — ABNORMAL LOW (ref 0.57–1.00)
GFR calc Af Amer: 125 mL/min/{1.73_m2} (ref >59)
GFR calc non Af Amer: 109 mL/min/{1.73_m2} (ref >59)
Globulin, Total: 2.3 g/dL (ref 1.5–4.5)
Glucose: 127 mg/dL — ABNORMAL HIGH (ref 65–99)
Potassium: 4.2 mmol/L (ref 3.5–5.2)
Sodium: 137 mmol/L (ref 134–144)
Total Protein: 6.4 g/dL (ref 6.0–8.5)

## 2020-12-08 LAB — TSH: TSH: 1.49 u[IU]/mL (ref 0.450–4.500)

## 2020-12-09 LAB — MAGNESIUM: Magnesium: 1.6 mg/dL (ref 1.6–2.3)

## 2020-12-09 LAB — VITAMIN D 25 HYDROXY (VIT D DEFICIENCY, FRACTURES): Vit D, 25-Hydroxy: 37.8 ng/mL (ref 30.0–100.0)

## 2020-12-09 LAB — SPECIMEN STATUS REPORT

## 2020-12-14 ENCOUNTER — Ambulatory Visit: Admitting: Physician Assistant

## 2020-12-15 ENCOUNTER — Other Ambulatory Visit (INDEPENDENT_AMBULATORY_CARE_PROVIDER_SITE_OTHER): Payer: Self-pay | Admitting: Family Medicine

## 2020-12-15 DIAGNOSIS — E038 Other specified hypothyroidism: Secondary | ICD-10-CM

## 2020-12-21 ENCOUNTER — Other Ambulatory Visit: Payer: Self-pay | Admitting: Physician Assistant

## 2020-12-21 ENCOUNTER — Other Ambulatory Visit

## 2020-12-21 DIAGNOSIS — E559 Vitamin D deficiency, unspecified: Secondary | ICD-10-CM

## 2020-12-24 ENCOUNTER — Ambulatory Visit (INDEPENDENT_AMBULATORY_CARE_PROVIDER_SITE_OTHER): Admitting: Physician Assistant

## 2020-12-24 ENCOUNTER — Other Ambulatory Visit: Payer: Self-pay

## 2020-12-24 ENCOUNTER — Encounter: Payer: Self-pay | Admitting: Physician Assistant

## 2020-12-24 VITALS — BP 113/73 | HR 86 | Ht 68.0 in | Wt 258.2 lb

## 2020-12-24 DIAGNOSIS — Z0000 Encounter for general adult medical examination without abnormal findings: Secondary | ICD-10-CM | POA: Diagnosis not present

## 2020-12-24 DIAGNOSIS — Z1211 Encounter for screening for malignant neoplasm of colon: Secondary | ICD-10-CM | POA: Diagnosis not present

## 2020-12-24 DIAGNOSIS — C9110 Chronic lymphocytic leukemia of B-cell type not having achieved remission: Secondary | ICD-10-CM

## 2020-12-24 DIAGNOSIS — M5441 Lumbago with sciatica, right side: Secondary | ICD-10-CM | POA: Diagnosis not present

## 2020-12-24 DIAGNOSIS — Z23 Encounter for immunization: Secondary | ICD-10-CM

## 2020-12-24 NOTE — Patient Instructions (Addendum)
Sciatica Rehab Ask your health care provider which exercises are safe for you. Do exercises exactly as told by your health care provider and adjust them as directed. It is normal to feel mild stretching, pulling, tightness, or discomfort as you do these exercises. Stop right away if you feel sudden pain or your pain gets worse. Do not begin these exercises until told by your health care provider. Stretching and range-of-motion exercises These exercises warm up your muscles and joints and improve the movement and flexibility of your hips and back. These exercises also help to relieve pain, numbness, and tingling. Sciatic nerve glide 1. Sit in a chair with your head facing down toward your chest. Place your hands behind your back. Let your shoulders slump forward. 2. Slowly straighten one of your legs while you tilt your head back as if you are looking toward the ceiling. Only straighten your leg as far as you can without making your symptoms worse. 3. Hold this position for __________ seconds. 4. Slowly return to the starting position. 5. Repeat with your other leg. Repeat __________ times. Complete this exercise __________ times a day. Knee to chest with hip adduction and internal rotation 1. Lie on your back on a firm surface with both legs straight. 2. Bend one of your knees and move it up toward your chest until you feel a gentle stretch in your lower back and buttock. Then, move your knee toward the shoulder that is on the opposite side from your leg. This is hip adduction and internal rotation. ? Hold your leg in this position by holding on to the front of your knee. 3. Hold this position for __________ seconds. 4. Slowly return to the starting position. 5. Repeat with your other leg. Repeat __________ times. Complete this exercise __________ times a day.   Prone extension on elbows 1. Lie on your abdomen on a firm surface. A bed may be too soft for this exercise. 2. Prop yourself up on  your elbows. 3. Use your arms to help lift your chest up until you feel a gentle stretch in your abdomen and your lower back. ? This will place some of your body weight on your elbows. If this is uncomfortable, try stacking pillows under your chest. ? Your hips should stay down, against the surface that you are lying on. Keep your hip and back muscles relaxed. 4. Hold this position for __________ seconds. 5. Slowly relax your upper body and return to the starting position. Repeat __________ times. Complete this exercise __________ times a day.   Strengthening exercises These exercises build strength and endurance in your back. Endurance is the ability to use your muscles for a long time, even after they get tired. Pelvic tilt This exercise strengthens the muscles that lie deep in the abdomen. 1. Lie on your back on a firm surface. Bend your knees and keep your feet flat on the floor. 2. Tense your abdominal muscles. Tip your pelvis up toward the ceiling and flatten your lower back into the floor. ? To help with this exercise, you may place a small towel under your lower back and try to push your back into the towel. 3. Hold this position for __________ seconds. 4. Let your muscles relax completely before you repeat this exercise. Repeat __________ times. Complete this exercise __________ times a day. Alternating arm and leg raises 1. Get on your hands and knees on a firm surface. If you are on a hard floor, you may want to  use padding, such as an exercise mat, to cushion your knees. 2. Line up your arms and legs. Your hands should be directly below your shoulders, and your knees should be directly below your hips. 3. Lift your left leg behind you. At the same time, raise your right arm and straighten it in front of you. ? Do not lift your leg higher than your hip. ? Do not lift your arm higher than your shoulder. ? Keep your abdominal and back muscles tight. ? Keep your hips facing the  ground. ? Do not arch your back. ? Keep your balance carefully, and do not hold your breath. 4. Hold this position for __________ seconds. 5. Slowly return to the starting position. 6. Repeat with your right leg and your left arm. Repeat __________ times. Complete this exercise __________ times a day.   Posture and body mechanics Good posture and healthy body mechanics can help to relieve stress in your body's tissues and joints. Body mechanics refers to the movements and positions of your body while you do your daily activities. Posture is part of body mechanics. Good posture means:  Your spine is in its natural S-curve position (neutral).  Your shoulders are pulled back slightly.  Your head is not tipped forward. Follow these guidelines to improve your posture and body mechanics in your everyday activities. Standing  When standing, keep your spine neutral and your feet about hip width apart. Keep a slight bend in your knees. Your ears, shoulders, and hips should line up.  When you do a task in which you stand in one place for a long time, place one foot up on a stable object that is 2-4 inches (5-10 cm) high, such as a footstool. This helps keep your spine neutral.   Sitting  When sitting, keep your spine neutral and keep your feet flat on the floor. Use a footrest, if necessary, and keep your thighs parallel to the floor. Avoid rounding your shoulders, and avoid tilting your head forward.  When working at a desk or a computer, keep your desk at a height where your hands are slightly lower than your elbows. Slide your chair under your desk so you are close enough to maintain good posture.  When working at a computer, place your monitor at a height where you are looking straight ahead and you do not have to tilt your head forward or downward to look at the screen.   Resting  When lying down and resting, avoid positions that are most painful for you.  If you have pain with activities  such as sitting, bending, stooping, or squatting, lie in a position in which your body does not bend very much. For example, avoid curling up on your side with your arms and knees near your chest (fetal position).  If you have pain with activities such as standing for a long time or reaching with your arms, lie with your spine in a neutral position and bend your knees slightly. Try the following positions: ? Lying on your side with a pillow between your knees. ? Lying on your back with a pillow under your knees. Lifting  When lifting objects, keep your feet at least shoulder width apart and tighten your abdominal muscles.  Bend your knees and hips and keep your spine neutral. It is important to lift using the strength of your legs, not your back. Do not lock your knees straight out.  Always ask for help to lift heavy or awkward objects.  This information is not intended to replace advice given to you by your health care provider. Make sure you discuss any questions you have with your health care provider. Document Revised: 03/15/2019 Document Reviewed: 12/13/2018 Elsevier Patient Education  2021 [place]. Sciatica  Sciatica is pain, weakness, tingling, or loss of feeling (numbness) along the sciatic nerve. The sciatic nerve starts in the lower back and goes down the back of each leg. Sciatica usually goes away on its own or with treatment. Sometimes, sciatica may come back (recur). What are the causes? This condition happens when the sciatic nerve is pinched or has pressure put on it. This may be the result of:  A disk in between the bones of the spine bulging out too far (herniated disk).  Changes in the spinal disks that occur with aging.  A condition that affects a muscle in the butt.  Extra bone growth near the sciatic nerve.  A break (fracture) of the area between your hip bones (pelvis).  Pregnancy.  Tumor. This is rare. What increases the risk? You are more likely to  develop this condition if you:  Play sports that put pressure or stress on the spine.  Have poor strength and ease of movement (flexibility).  Have had a back injury in the past.  Have had back surgery.  Sit for long periods of time.  Do activities that involve bending or lifting over and over again.  Are very overweight (obese). What are the signs or symptoms? Symptoms can vary from mild to very bad. They may include:  Any of these problems in the lower back, leg, hip, or butt: ? Mild tingling, loss of feeling, or dull aches. ? Burning sensations. ? Sharp pains.  Loss of feeling in the back of the calf or the sole of the foot.  Leg weakness.  Very bad back pain that makes it hard to move. These symptoms may get worse when you cough, sneeze, or laugh. They may also get worse when you sit or stand for long periods of time. How is this treated? This condition often gets better without any treatment. However, treatment may include:  Changing or cutting back on physical activity when you have pain.  Doing exercises and stretching.  Putting ice or heat on the affected area.  Medicines that help: ? To relieve pain and swelling. ? To relax your muscles.  Shots (injections) of medicines that help to relieve pain, irritation, and swelling.  Surgery. Follow these instructions at home: Medicines  Take over-the-counter and prescription medicines only as told by your doctor.  Ask your doctor if the medicine prescribed to you: ? Requires you to avoid driving or using heavy machinery. ? Can cause trouble pooping (constipation). You may need to take these steps to prevent or treat trouble pooping:  Drink enough fluids to keep your pee (urine) pale yellow.  Take over-the-counter or prescription medicines.  Eat foods that are high in fiber. These include beans, whole grains, and fresh fruits and vegetables.  Limit foods that are high in fat and sugar. These include fried or  sweet foods. Managing pain  If told, put ice on the affected area. ? Put ice in a plastic bag. ? Place a towel between your skin and the bag. ? Leave the ice on for 20 minutes, 2-3 times a day.  If told, put heat on the affected area. Use the heat source that your doctor tells you to use, such as a moist heat pack or  a heating pad. ? Place a towel between your skin and the heat source. ? Leave the heat on for 20-30 minutes. ? Remove the heat if your skin turns bright red. This is very important if you are unable to feel pain, heat, or cold. You may have a greater risk of getting burned.      Activity  Return to your normal activities as told by your doctor. Ask your doctor what activities are safe for you.  Avoid activities that make your symptoms worse.  Take short rests during the day. ? When you rest for a long time, do some physical activity or stretching between periods of rest. ? Avoid sitting for a long time without moving. Get up and move around at least one time each hour.  Exercise and stretch regularly, as told by your doctor.  Do not lift anything that is heavier than 10 lb (4.5 kg) while you have symptoms of sciatica. ? Avoid lifting heavy things even when you do not have symptoms. ? Avoid lifting heavy things over and over.  When you lift objects, always lift in a way that is safe for your body. To do this, you should: ? Bend your knees. ? Keep the object close to your body. ? Avoid twisting.   General instructions  Stay at a healthy weight.  Wear comfortable shoes that support your feet. Avoid wearing high heels.  Avoid sleeping on a mattress that is too soft or too hard. You might have less pain if you sleep on a mattress that is firm enough to support your back.  Keep all follow-up visits as told by your doctor. This is important. Contact a doctor if:  You have pain that: ? Wakes you up when you are sleeping. ? Gets worse when you lie down. ? Is worse  than the pain you have had in the past. ? Lasts longer than 4 weeks.  You lose weight without trying. Get help right away if:  You cannot control when you pee (urinate) or poop (have a bowel movement).  You have weakness in any of these areas and it gets worse: ? Lower back. ? The area between your hip bones. ? Butt. ? Legs.  You have redness or swelling of your back.  You have a burning feeling when you pee. Summary  Sciatica is pain, weakness, tingling, or loss of feeling (numbness) along the sciatic nerve.  This condition happens when the sciatic nerve is pinched or has pressure put on it.  Sciatica can cause pain, tingling, or loss of feeling (numbness) in the lower back, legs, hips, and butt.  Treatment often includes rest, exercise, medicines, and putting ice or heat on the affected area. This information is not intended to replace advice given to you by your health care provider. Make sure you discuss any questions you have with your health care provider. Document Revised: 12/10/2018 Document Reviewed: 12/10/2018 Elsevier Patient Education  2021 [employer] Preventive Care 48-106 Years Old, Female Preventive care refers to lifestyle choices and visits with your health care provider that can promote health and wellness. This includes:  A yearly physical exam. This is also called an annual wellness visit.  Regular dental and eye exams.  Immunizations.  Screening for certain conditions.  Healthy lifestyle choices, such as: ? Eating a healthy diet. ? Getting regular exercise. ? Not using drugs or products that contain nicotine and tobacco. ? Limiting alcohol use. What can I expect for my preventive care visit?  Physical exam Your health care provider will check your:  Height and weight. These may be used to calculate your BMI (body mass index). BMI is a measurement that tells if you are at a healthy weight.  Heart rate and blood pressure.  Body  temperature.  Skin for abnormal spots. Counseling Your health care provider may ask you questions about your:  Past medical problems.  Family's medical history.  Alcohol, tobacco, and drug use.  Emotional well-being.  Home life and relationship well-being.  Sexual activity.  Diet, exercise, and sleep habits.  Work and work Statistician.  Access to firearms.  Method of birth control.  Menstrual cycle.  Pregnancy history. What immunizations do I need? Vaccines are usually given at various ages, according to a schedule. Your health care provider will recommend vaccines for you based on your age, medical history, and lifestyle or other factors, such as travel or where you work.   What tests do I need? Blood tests  Lipid and cholesterol levels. These may be checked every 5 years, or more often if you are over 79 years old.  Hepatitis C test.  Hepatitis B test. Screening  Lung cancer screening. You may have this screening every year starting at age 72 if you have a 30-pack-year history of smoking and currently smoke or have quit within the past 15 years.  Colorectal cancer screening. ? All adults should have this screening starting at age 41 and continuing until age 18. ? Your health care provider may recommend screening at age 77 if you are at increased risk. ? You will have tests every 1-10 years, depending on your results and the type of screening test.  Diabetes screening. ? This is done by checking your blood sugar (glucose) after you have not eaten for a while (fasting). ? You may have this done every 1-3 years.  Mammogram. ? This may be done every 1-2 years. ? Talk with your health care provider about when you should start having regular mammograms. This may depend on whether you have a family history of breast cancer.  BRCA-related cancer screening. This may be done if you have a family history of breast, ovarian, tubal, or peritoneal cancers.  Pelvic exam  and Pap test. ? This may be done every 3 years starting at age 33. ? Starting at age 27, this may be done every 5 years if you have a Pap test in combination with an HPV test. Other tests  STD (sexually transmitted disease) testing, if you are at risk.  Bone density scan. This is done to screen for osteoporosis. You may have this scan if you are at high risk for osteoporosis. Talk with your health care provider about your test results, treatment options, and if necessary, the need for more tests. Follow these instructions at home: Eating and drinking  Eat a diet that includes fresh fruits and vegetables, whole grains, lean protein, and low-fat dairy products.  Take vitamin and mineral supplements as recommended by your health care provider.  Do not drink alcohol if: ? Your health care provider tells you not to drink. ? You are pregnant, may be pregnant, or are planning to become pregnant.  If you drink alcohol: ? Limit how much you have to 0-1 drink a day. ? Be aware of how much alcohol is in your drink. In the [place] one drink equals one 12 oz bottle of beer (355 mL), one 5 oz glass of wine (148 mL), or one 1 oz glass of  hard liquor (44 mL).   Lifestyle  Take daily care of your teeth and gums. Brush your teeth every morning and night with fluoride toothpaste. Floss one time each day.  Stay active. Exercise for at least 30 minutes 5 or more days each week.  Do not use any products that contain nicotine or tobacco, such as cigarettes, e-cigarettes, and chewing tobacco. If you need help quitting, ask your health care provider.  Do not use drugs.  If you are sexually active, practice safe sex. Use a condom or other form of protection to prevent STIs (sexually transmitted infections).  If you do not wish to become pregnant, use a form of birth control. If you plan to become pregnant, see your health care provider for a prepregnancy visit.  If told by your health care provider, take  low-dose aspirin daily starting at age 31.  Find healthy ways to cope with stress, such as: ? Meditation, yoga, or listening to music. ? Journaling. ? Talking to a trusted person. ? Spending time with friends and family. Safety  Always wear your seat belt while driving or riding in a vehicle.  Do not drive: ? If you have been drinking alcohol. Do not ride with someone who has been drinking. ? When you are tired or distracted. ? While texting.  Wear a helmet and other protective equipment during sports activities.  If you have firearms in your house, make sure you follow all gun safety procedures. What's next?  Visit your health care provider once a year for an annual wellness visit.  Ask your health care provider how often you should have your eyes and teeth checked.  Stay up to date on all vaccines. This information is not intended to replace advice given to you by your health care provider. Make sure you discuss any questions you have with your health care provider. Document Revised: 08/25/2020 Document Reviewed: 08/02/2018 Elsevier Patient Education  2021 [place].    Hypercalcemia Hypercalcemia is when the level of calcium in a person's blood is above normal. The body needs calcium to make bones and keep them strong. Calcium also helps the muscles, nerves, brain, and heart work the way they should. Most of the calcium in the body is in the bones. There is also some calcium in the blood. Hypercalcemia can happen when calcium comes out of the bones, or when the kidneys are not able to remove calcium from the blood. Hypercalcemia can be mild or severe. What are the causes? There are many possible causes of hypercalcemia. Common causes of this condition include:  Hyperparathyroidism. This is a condition in which the body produces too much parathyroid hormone. There are four parathyroid glands in your neck. These glands produce a chemical messenger (hormone) that helps the  body absorb calcium from foods and helps your bones release calcium.  Certain kinds of cancer. Less common causes of hypercalcemia include:  Getting too much calcium or vitamin D from your diet.  Kidney failure.  Hyperthyroidism.  Severe dehydration.  Being on bed rest or being inactive for a long time.  Certain medicines.  Infections. What increases the risk? You are more likely to develop this condition if you:  Are female.  Are 9 years of age or older.  Have a family history of hypercalcemia. What are the signs or symptoms? Mild hypercalcemia that starts slowly may not cause symptoms. Severe, sudden hypercalcemia is more likely to cause symptoms, such as:  Being more thirsty than usual.  Needing  to urinate more often than usual.  Abdominal pain.  Nausea and vomiting.  Constipation.  Muscle pain, twitching, or weakness.  Feeling very tired. How is this diagnosed? Hypercalcemia is usually diagnosed with a blood test. You may also have tests to help determine what is causing this condition, such as imaging tests and more blood tests.   How is this treated? Treatment for hypercalcemia depends on the cause. Treatment may include:  Receiving fluids through an IV.  Medicines that: ? Keep calcium levels steady after receiving fluids (loop diuretics). ? Keep calcium in your bones (bisphosphonates). ? Lower the calcium level in your blood.  Surgery to remove overactive parathyroid glands.  A procedure that filters your blood to correct calcium levels (hemodialysis). Follow these instructions at home:  Take over-the-counter and prescription medicines only as told by your health care provider.  Follow instructions from your health care provider about eating or drinking restrictions.  Drink enough fluid to keep your urine pale yellow.  Stay active. Weight-bearing exercise helps to keep calcium in your bones. Follow instructions from your health care provider  about what type and level of exercise is safe for you.  Keep all follow-up visits as told by your health care provider. This is important.   Contact a health care provider if you have:  A fever.  A heartbeat that is irregular or very fast.  Changes in mood, memory, or personality. Get help right away if you:  Have severe abdominal pain.  Have chest pain.  Have trouble breathing.  Become very confused and sleepy.  Lose consciousness. Summary  Hypercalcemia is when the level of calcium in a person's blood is above normal. The body needs calcium to make bones and keep them strong. Calcium also helps the muscles, nerves, brain, and heart work the way they should.  There are many possible causes of hypercalcemia, and treatment depends on the cause.  Take over-the-counter and prescription medicines only as told by your health care provider.  Follow instructions from your health care provider about eating or drinking restrictions. This information is not intended to replace advice given to you by your health care provider. Make sure you discuss any questions you have with your health care provider. Document Revised: 12/18/2018 Document Reviewed: 08/27/2018 Elsevier Patient Education  2021 [employer].

## 2020-12-24 NOTE — Progress Notes (Signed)
Female Physical   Impression and Recommendations:    1. Healthcare maintenance   2. Need for influenza vaccination   3. Screening for colon cancer   4. Acute bilateral low back pain with right-sided sciatica   5. CLL (chronic lymphocytic leukemia) (HCC)      1) Anticipatory Guidance: Skin CA prevention - recommend to use sunscreen when outside along with skin surveillance; eating a balanced and modest diet; physical activity at least 25 minutes per day or minimum of 150 min/ week moderate to intense activity.  2) Immunizations / Screenings / Labs:   All immunizations are up-to-date per recommendations or will be updated today if pt allows.    - Patient understands with dental and vision screens they will schedule independently.  - Obtained CBC, CMP, HgA1c, Lipid panel, TSH and vit D when fasting. Most labs are essentially within normal limits or stable from prior. Calcium slightly elevated and will repeat at follow up visit. -UTD on pap smear, scheduled for mammogram 01/2021, and is agreeable to Cologuard and influenza vaccine. -Declined Shingrix, Hep C and HIV screenings.    3) Weight: Recommend to continue to improve diet habits to improve overall feelings of well being and objective health data. Improve nutrient density of diet through increasing intake of fruits and vegetables and decreasing saturated fats, white flour products and refined sugars. -Continue to follow up with [facility].  4) Healthcare Maintenance: -Recommend conservative treatment for back pain and right posterior leg pain with heat/cold therapy, NSAID (ibuprofen 600 mg BID-TID x 2-3 days), and back stretches/exercises. If symptoms fail to improve or worsen recommend further evaluation with imaging studies and/or short course of corticosteroid therapy. On exam, tenderness to palpation of lateral greater trochanter of right hip so possibly 2 different MSK etiologies.  -Continue current medication  regimen. -Continue to follow up with [facility] as instructed annually.  -Follow up in 4 months for DM, HTN, HLD   No orders of the defined types were placed in this encounter.   Orders Placed This Encounter  Procedures  . Flu Vaccine QUAD 6+ mos PF IM (Fluarix Quad PF)  . Cologuard     Return in about 4 months (around 04/23/2021) for DM, HTN, HLD.     Gross side effects, risk and benefits, and alternatives of medications discussed with patient.  Patient is aware that all medications have potential side effects and we are unable to predict every side effect or drug-drug interaction that may occur.  Expresses verbal understanding and consents to current therapy plan and treatment regimen.  F-up preventative CPE in 1 year- this is in addition to any chronic care visits.    Please see orders placed and AVS handed out to patient at the end of our visit for further patient instructions/ counseling done pertaining to today's office visit.   Subjective:     CPE HPI: Rachel Simmons is a 55 y.o. female who presents to [place] at [facility] today for a yearly health maintenance exam.   Health Maintenance Summary  - Reviewed and updated, unless pt declines services.  Last Cologuard or Colonoscopy:    Declined colonoscopy, agreeable to Cologuard. Family history of Colon CA: N  Tobacco History Reviewed:  Y, never a smoker  Alcohol and/or drug use:    No concerns; no use Exercise Habits:  Low-moderate physical activity Dental Home:Y Eye exams:y Dermatology home:Y, not recently.  Female Health:  PAP Smear - last known results:  10/03/2018- normal STD concerns:   none, monogamous Birth control method:  S/p partial hysterectomy Menses regular:  n/a Lumps or breast concerns:  none Breast Cancer Family History: Y, first and second degree relatives.    Additional concerns beyond health maintenance issues: Low back pain with radiation to right leg on  the back which onset is during driving or sitting. Denies fever, chills, trauma/injury, bladder or bowel dysfunction. Has tried ibuprofen 400 mg once daily with minimal relief.   Immunization History  Administered Date(s) Administered  . Influenza,inj,Quad PF,6+ Mos 08/26/2016, 10/10/2017, 09/18/2019, 12/24/2020  . Influenza,inj,quad, With Preservative 08/26/2016  . Influenza-Unspecified 09/04/2016  . PFIZER(Purple Top)SARS-COV-2 Vaccination 02/27/2020, 03/24/2020  . Pneumococcal Polysaccharide-23 10/18/2016  . Tdap 08/26/2016     Health Maintenance  Topic Date Due  . Hepatitis C Screening  Never done  . COLONOSCOPY (Pts 45-31yrs Insurance coverage will need to be confirmed)  Never done  . COVID-19 Vaccine (3 - Pfizer risk 4-dose series) 04/21/2020  . OPHTHALMOLOGY EXAM  09/17/2020  . FOOT EXAM  04/29/2021  . HEMOGLOBIN A1C  06/06/2021  . PAP SMEAR-Modifier  10/03/2021  . MAMMOGRAM  01/07/2022  . TETANUS/TDAP  08/26/2026  . INFLUENZA VACCINE  Completed  . PNEUMOCOCCAL POLYSACCHARIDE VACCINE AGE 54-64 HIGH RISK  Completed  . HIV Screening  Completed     Wt Readings from Last 3 Encounters:  12/24/20 258 lb 3.2 oz (117.1 kg)  12/07/20 249 lb (112.9 kg)  11/09/20 253 lb (114.8 kg)   BP Readings from Last 3 Encounters:  12/24/20 113/73  12/07/20 108/72  11/09/20 100/66   Pulse Readings from Last 3 Encounters:  12/24/20 86  11/09/20 80  10/19/20 68     Past Medical History:  Diagnosis Date  . Back pain   . Chronic lymphoblastic leukemia   . Diabetes mellitus without complication ([place])   . Edema, lower extremity   . Family history of breast cancer   . Family history of colon cancer   . Family history of polyps in the colon   . Hypertension   . Joint pain   . Prediabetes   . Thyroid disease       Past Surgical History:  Procedure Laterality Date  . ABDOMINAL HYSTERECTOMY  2006   reports ovaries and cervix are intact  . TONSILLECTOMY        Family  History  Problem Relation Age of Onset  . Breast cancer Mother        16s; deceased 33  . Colon polyps Mother        Dx 19s; #/type unknown  . Diabetes Mother   . Heart disease Mother   . Thyroid disease Mother   . Sleep apnea Mother   . Obesity Mother   . Cancer Father        lung cancer; smoker; deceased 43  . Alcoholism Father   . Breast cancer Sister 4       triple negative; currently 26  . Cancer Maternal Aunt        lung cancer; smoker; currently 1  . Colon cancer Maternal Uncle        Dx 66s; currently 36  . Breast cancer Paternal Aunt        Dx 35s; currently 15s  . Cancer Paternal Uncle        liver; heavy drinker  . Cancer Maternal Grandfather        unk. primary; deceased 57s  . Cancer Paternal Grandmother  Dx 23s; unknown primary  . Breast cancer Sister 40       currently 33  . Uterine cancer Sister 49       currently 26  . Cancer Paternal Uncle        lung; heavy smoker  . Breast cancer Cousin        mat first cousin through aunt; poss. breast ca; had mastectomy; currently 50  . Ovarian cancer Other        mat grandmother's sister  . Cancer Other        stomach cancer; mat grandmother's sister  . Colon polyps Brother        age, #, type unknown      Social History   Substance and Sexual Activity  Drug Use No  ,   Social History   Substance and Sexual Activity  Alcohol Use No  ,   Social History   Tobacco Use  Smoking Status Never Smoker  Smokeless Tobacco Never Used  ,   Social History   Substance and Sexual Activity  Sexual Activity Yes  . Birth control/protection: None    Current Outpatient Medications on File Prior to Visit  Medication Sig Dispense Refill  . aspirin 81 MG chewable tablet Chew 81 mg by mouth daily.    Marland Kitchen atorvastatin (LIPITOR) 10 MG tablet Take 0.5 tablets (5 mg total) by mouth at bedtime. 45 tablet 0  . calcium carbonate (OS-CAL) 600 MG TABS tablet Take 1 tablet by mouth daily.    . cetirizine  (ZYRTEC) 5 MG tablet Take 1 tablet by mouth daily.    Marland Kitchen FLUoxetine (PROZAC) 20 MG capsule TAKE 1 CAPSULE DAILY 90 capsule 0  . glucose blood (FREESTYLE LITE) test strip CHECK FASTING BLOOD SUGAR AND 2 HOURS AFTER A MEAL 200 each 3  . levothyroxine (LEVOXYL) 112 MCG tablet Take 1 tablet (112 mcg total) by mouth daily before breakfast. 90 tablet 0  . lisinopril-hydrochlorothiazide (ZESTORETIC) 20-25 MG tablet Take 1 tablet by mouth daily. 90 tablet 0  . metFORMIN (GLUCOPHAGE) 1000 MG tablet Take 1 tablet (1,000 mg total) by mouth 2 (two) times daily with a meal. 60 tablet 0  . Multiple Vitamins-Minerals (ONE-A-DAY WOMENS 50 PLUS PO) Take 1 tablet daily by mouth.    . Omega-3 Fatty Acids (FISH OIL) 1200 MG CAPS Take 4-5 tabs daily    . Vitamin D, Ergocalciferol, (DRISDOL) 1.25 MG (50000 UNIT) CAPS capsule TAKE 1 CAPSULE EVERY 7 DAYS 12 capsule 3   No current facility-administered medications on file prior to visit.    Allergies: Patient has no known allergies.  Review of Systems: General:   Denies fever, chills, unexplained weight loss.  Optho/Auditory:   Denies visual changes, blurred vision/LOV Respiratory:   Denies SOB, DOE more than baseline levels.   Cardiovascular:   Denies chest pain, palpitations, new onset peripheral edema  Gastrointestinal:   Denies nausea, vomiting, diarrhea.  Genitourinary: Denies dysuria, freq/ urgency, flank pain or discharge from genitals.  Endocrine:     Denies hot or cold intolerance, polyuria, polydipsia. Musculoskeletal:   Denies joint swelling, gait problems, +back pain Skin:  Denies rash, suspicious lesions Neurological:     Denies dizziness, unexplained weakness, numbness  Psychiatric/Behavioral:   Denies mood changes, suicidal or homicidal ideations, hallucinations    Objective:    Blood pressure 113/73, pulse 86, height 5\' 8"  (1.727 m), weight 258 lb 3.2 oz (117.1 kg), SpO2 99 %. Body mass index is 39.26 kg/m. General Appearance:  Alert,  cooperative, no distress, appears stated age  Head:    Normocephalic, without obvious abnormality, atraumatic  Eyes:    PERRL, conjunctiva/corneas clear, EOM's intact, both eyes  Ears:    Normal TM's and external ear canals, both ears  Nose:   Nares normal, septum midline, mucosa normal, no drainage    or sinus tenderness  Throat:   Lips w/o lesion, mucosa moist, and tongue normal; teeth and   gums normal  Neck:   Supple, symmetrical, trachea midline, no adenopathy;    thyroid:  no enlargement/tenderness/nodules; no carotid   bruit  Back:     TTP of low back B/L and posterior right leg, TTP of lateral  right leg at greater trochanter, good ROM, no CVA tenderness  Lungs:     Clear to auscultation bilaterally, respirations unlabored, no       Wh/ R/ R  Chest Wall:    No tenderness or gross deformity; normal excursion   Heart:    Regular rate and rhythm, S1 and S2 normal, no murmur, rub   or gallop  Breast Exam:    Deferred by pt. Has upcoming mammogram.  Abdomen:     Soft, non-tender, bowel sounds active all four quadrants, No G/R/R, no masses, no organomegaly  Genitalia:    Deferred by pt. UTD on pap, has no concerns.  Rectal:    Deferred by pt.   Extremities:   Extremities normal, atraumatic, no cyanosis or gross edema (wearing compression socks)  Pulses:   2+ and symmetric all extremities  Skin:   Warm, dry, Skin color, texture, turgor normal, no obvious rashes or lesions Psych: No HI/SI, judgement and insight good, Euthymic mood. Full Affect.  Neurologic:   CNII-XII grossly intact, normal strength, sensation and reflexes throughout

## 2020-12-29 ENCOUNTER — Other Ambulatory Visit: Payer: Self-pay | Admitting: Family Medicine

## 2020-12-29 ENCOUNTER — Other Ambulatory Visit: Payer: Self-pay | Admitting: *Deleted

## 2020-12-29 DIAGNOSIS — Z0000 Encounter for general adult medical examination without abnormal findings: Secondary | ICD-10-CM

## 2020-12-30 ENCOUNTER — Encounter: Payer: Self-pay | Admitting: Physician Assistant

## 2020-12-30 DIAGNOSIS — M79606 Pain in leg, unspecified: Secondary | ICD-10-CM

## 2020-12-31 MED ORDER — BACLOFEN 10 MG PO TABS: 10.0000 mg | ORAL_TABLET | Freq: Two times a day (BID) | ORAL | 0 refills | Status: DC | PRN

## 2021-01-04 ENCOUNTER — Other Ambulatory Visit (INDEPENDENT_AMBULATORY_CARE_PROVIDER_SITE_OTHER): Payer: Self-pay | Admitting: Family Medicine

## 2021-01-04 ENCOUNTER — Other Ambulatory Visit: Payer: Self-pay | Admitting: Physician Assistant

## 2021-01-04 DIAGNOSIS — F4323 Adjustment disorder with mixed anxiety and depressed mood: Secondary | ICD-10-CM

## 2021-01-04 DIAGNOSIS — E1169 Type 2 diabetes mellitus with other specified complication: Secondary | ICD-10-CM

## 2021-01-05 ENCOUNTER — Encounter: Payer: Self-pay | Admitting: Physician Assistant

## 2021-01-05 DIAGNOSIS — E038 Other specified hypothyroidism: Secondary | ICD-10-CM

## 2021-01-05 MED ORDER — LEVOTHYROXINE SODIUM 112 MCG PO TABS: 112.0000 ug | ORAL_TABLET | Freq: Every day | ORAL | 1 refills | Status: DC

## 2021-01-06 ENCOUNTER — Other Ambulatory Visit: Payer: Self-pay | Admitting: Physician Assistant

## 2021-01-06 DIAGNOSIS — E1169 Type 2 diabetes mellitus with other specified complication: Secondary | ICD-10-CM

## 2021-01-06 MED ORDER — METFORMIN HCL 1000 MG PO TABS: 1000.0000 mg | ORAL_TABLET | Freq: Two times a day (BID) | ORAL | 1 refills | Status: DC

## 2021-01-14 MED ORDER — PREDNISONE 20 MG PO TABS: ORAL_TABLET | ORAL | 0 refills | Status: DC

## 2021-01-14 NOTE — Addendum Note (Signed)
Addended by: Lorrene Reid on: 01/14/2021 04:53 PM   Modules accepted: Orders

## 2021-01-17 LAB — COLOGUARD: Cologuard: NEGATIVE

## 2021-01-24 ENCOUNTER — Encounter: Payer: Self-pay | Admitting: Physician Assistant

## 2021-01-24 DIAGNOSIS — M79606 Pain in leg, unspecified: Secondary | ICD-10-CM

## 2021-01-26 ENCOUNTER — Encounter: Payer: Self-pay | Admitting: Physician Assistant

## 2021-01-26 LAB — COLOGUARD

## 2021-02-11 ENCOUNTER — Ambulatory Visit
Admission: RE | Admit: 2021-02-11 | Discharge: 2021-02-11 | Disposition: A | Discharge status: Home / Self Care | Source: Ambulatory Visit | Attending: Family Medicine | Admitting: Family Medicine

## 2021-02-11 ENCOUNTER — Other Ambulatory Visit: Payer: Self-pay

## 2021-02-11 DIAGNOSIS — Z0000 Encounter for general adult medical examination without abnormal findings: Secondary | ICD-10-CM

## 2021-02-15 ENCOUNTER — Ambulatory Visit (INDEPENDENT_AMBULATORY_CARE_PROVIDER_SITE_OTHER): Admitting: Family Medicine

## 2021-02-15 ENCOUNTER — Ambulatory Visit (INDEPENDENT_AMBULATORY_CARE_PROVIDER_SITE_OTHER)

## 2021-02-15 ENCOUNTER — Encounter: Payer: Self-pay | Admitting: Family Medicine

## 2021-02-15 ENCOUNTER — Other Ambulatory Visit: Payer: Self-pay

## 2021-02-15 DIAGNOSIS — M5441 Lumbago with sciatica, right side: Secondary | ICD-10-CM | POA: Diagnosis not present

## 2021-02-15 DIAGNOSIS — M5440 Lumbago with sciatica, unspecified side: Secondary | ICD-10-CM | POA: Diagnosis not present

## 2021-02-15 DIAGNOSIS — M5442 Lumbago with sciatica, left side: Secondary | ICD-10-CM

## 2021-02-15 DIAGNOSIS — G8929 Other chronic pain: Secondary | ICD-10-CM | POA: Diagnosis not present

## 2021-02-15 MED ORDER — TIZANIDINE HCL 2 MG PO TABS: 2.0000 mg | ORAL_TABLET | Freq: Four times a day (QID) | ORAL | 1 refills | Status: DC | PRN

## 2021-02-15 NOTE — Progress Notes (Signed)
Office Visit Note   Patient: Rachel Simmons           Date of Birth: 01-26-1966           MRN: [id number] Visit Date: 02/15/2021 Requested by: Lorrene Reid, PA-C [address] [facility] [place],  [place] [address] PCP: Lorrene Reid, PA-C  Subjective: Chief Complaint  Patient presents with  . Lower Back - Pain    Pain across lower back, with radicular pain down the posterior legs and wrapping around to the anterior lower legs. Shooting pains to the feet. Started in September 2021. Got better with a steroid pack from her PCP, but "the pain came back with a vengeance."    HPI: She is here with low back pain.  Longstanding intermittent problems with her back, she had an injury in 2012 where while working at [employer], a shelf fell on her.  She had an MRI scan done showing multiple disc protrusions.  Ultimately she got very good relief with a lumbar epidural injection and has had minimal troubles until this past September when without a new injury, she started having pain again.  Midline lumbosacral pain with radiation into both legs and down to the feet.  Denies any bowel or bladder incontinence, fevers or chills.  She is working as an Merchandiser, retail.  She has tried baclofen which helped a little bit but it made her too drowsy.               ROS:   All other systems were reviewed and are negative.  Objective: Vital Signs: There were no vitals taken for this visit.  Physical Exam:  General:  Alert and oriented, in no acute distress. Pulm:  Breathing unlabored. Psy:  Normal mood, congruent affect. Skin: No rash Low back: She is tender near the SI joints and in the midline over the L5-S1 level.  Mild pain in the right sciatic notch.  Straight leg raise negative, no pain with internal hip rotation.  Lower extremity strength and reflexes are normal.   Imaging: XR Lumbar Spine 2-3 Views  Result Date: 02/15/2021 X-rays lumbar spine reveal well-preserved hip joints, no  significant hip DJD.  She has diffuse lumbar degenerative disc disease.  No sign of compression fracture or neoplasm.  There is moderate to severe lower lumbar facet arthropathy.   Assessment & Plan: 1.  Low back pain with bilateral sciatica, probably due to lumbar disc protrusions.  Cannot rule out SI joint dysfunction. -Since it helped her in the past, we will referred her to Dr. Ernestina Patches for epidural steroid injection.  Zanaflex as needed.  New MRI if she fails to improve.     Procedures: No procedures performed        PMFS History: Patient Active Problem List   Diagnosis Date Noted  . At risk for impaired metabolic function 62/13/0865  . Diabetes mellitus ([place]) 11/09/2020  . Vitamin D deficiency 11/09/2020  . At risk for hypoglycemia 11/09/2020  . Essential hypertension 10/05/2020  . Bilateral leg cramps 10/05/2020  . At risk for dehydration 10/05/2020  . Morbid obesity ([place]) 08/19/2020  . depressed mood, recurrent 01/30/2019  . Flu-like symptoms 12/18/2018  . Myalgias and generalized arthralgias 06/20/2018  . High risk for vaginitis due to Candida secondary to antibiotic use 06/06/2018  . Left tennis elbow 10/10/2017  . Muscle dysfunction/ spasm- upper traps on L 10/10/2017  . Hyperlipidemia associated with type 2 diabetes mellitus ([place]) 08/29/2017  . Hypothyroidism 08/29/2017  .  Hypertriglyceridemia 10/18/2016  . Insomnia 10/18/2016  . 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 Family history of colon cancer requiring screening colonoscopy 08/20/2016  . Obesity, Class III, BMI 40-49.9 (morbid obesity) ([place]) 08/20/2016  . Adjustment disorder with mixed anxiety and depressed mood 08/20/2016  . CLL (chronic lymphocytic leukemia) ([place]) 08/18/2016  . Type II Diabetes mellitus without complication ([place]) 86/76/1950  . Hypertension associated with diabetes ([place]) 08/18/2016  . Thyroid disease 08/18/2016  . Chronic back pain greater than 3 months duration 08/18/2016  .  Genetic testing 05/21/2015  . Family history of breast cancer   . Family history of colon cancer- brother age 41   . Family history of polyps in the colon   . Diabetic peripheral neuropathy ([place]) 04/21/2015   Past Medical History:  Diagnosis Date  . Back pain   . Chronic lymphoblastic leukemia   . Diabetes mellitus without complication ([place])   . Edema, lower extremity   . Family history of breast cancer   . Family history of colon cancer   . Family history of polyps in the colon   . Hypertension   . Joint pain   . Prediabetes   . Thyroid disease     Family History  Problem Relation Age of Onset  . Breast cancer Mother        81s; deceased 26  . Colon polyps Mother        Dx 64s; #/type unknown  . Diabetes Mother   . Heart disease Mother   . Thyroid disease Mother   . Sleep apnea Mother   . Obesity Mother   . Cancer Father        lung cancer; smoker; deceased 61  . Alcoholism Father   . Breast cancer Sister 64       triple negative; currently 80  . Cancer Maternal Aunt        lung cancer; smoker; currently 64  . Colon cancer Maternal Uncle        Dx 8s; currently 53  . Breast cancer Paternal Aunt        Dx 83s; currently 1s  . Cancer Paternal Uncle        liver; heavy drinker  . Cancer Maternal Grandfather        unk. primary; deceased 73s  . Cancer Paternal Grandmother        Dx 34s; unknown primary  . Breast cancer Sister 17       currently 73  . Uterine cancer Sister 37       currently 56  . Cancer Paternal Uncle        lung; heavy smoker  . Breast cancer Cousin        mat first cousin through aunt; poss. breast ca; had mastectomy; currently 50  . Ovarian cancer Other        mat grandmother's sister  . Cancer Other        stomach cancer; mat grandmother's sister  . Colon polyps Brother        age, #, type unknown    Past Surgical History:  Procedure Laterality Date  . ABDOMINAL HYSTERECTOMY  2006   reports ovaries and cervix are intact  .  TONSILLECTOMY     Social History   Occupational History  . Occupation: Sous Chef  Tobacco Use  . Smoking status: Never Smoker  . Smokeless tobacco: Never Used  Vaping Use  . Vaping Use: Never used  Substance and Sexual Activity  .  Alcohol use: No  . Drug use: No  . Sexual activity: Yes    Birth control/protection: None

## 2021-02-23 ENCOUNTER — Telehealth: Payer: Self-pay

## 2021-02-23 NOTE — Telephone Encounter (Signed)
Patient called she needs to reschedule her appointment to April 19,2022 anytime after 3 if possible call [phone]

## 2021-02-24 ENCOUNTER — Telehealth: Payer: Self-pay

## 2021-02-24 NOTE — Telephone Encounter (Signed)
Patient called she is returning you vm call [phone]

## 2021-02-24 NOTE — Telephone Encounter (Signed)
See previous message

## 2021-02-24 NOTE — Telephone Encounter (Signed)
Left message #1

## 2021-02-24 NOTE — Telephone Encounter (Signed)
Left message #2

## 2021-03-03 ENCOUNTER — Other Ambulatory Visit: Payer: Self-pay | Admitting: Physician Assistant

## 2021-03-03 DIAGNOSIS — I152 Hypertension secondary to endocrine disorders: Secondary | ICD-10-CM

## 2021-03-03 DIAGNOSIS — E1159 Type 2 diabetes mellitus with other circulatory complications: Secondary | ICD-10-CM

## 2021-03-05 ENCOUNTER — Telehealth: Payer: Self-pay | Admitting: Physical Medicine and Rehabilitation

## 2021-03-05 NOTE — Telephone Encounter (Signed)
Patient called. Would like to [facility] appointment with Dr. Ernestina Patches.

## 2021-03-08 NOTE — Telephone Encounter (Signed)
Rescheduled

## 2021-03-08 NOTE — Telephone Encounter (Signed)
Appointment rescheduled.

## 2021-03-09 ENCOUNTER — Ambulatory Visit: Admitting: Physical Medicine and Rehabilitation

## 2021-03-16 ENCOUNTER — Ambulatory Visit: Admitting: Physical Medicine and Rehabilitation

## 2021-03-24 ENCOUNTER — Other Ambulatory Visit: Payer: Self-pay

## 2021-03-24 ENCOUNTER — Encounter: Payer: Self-pay | Admitting: Physical Medicine and Rehabilitation

## 2021-03-24 ENCOUNTER — Ambulatory Visit: Payer: Self-pay

## 2021-03-24 ENCOUNTER — Ambulatory Visit (INDEPENDENT_AMBULATORY_CARE_PROVIDER_SITE_OTHER): Admitting: Physical Medicine and Rehabilitation

## 2021-03-24 VITALS — BP 115/76 | HR 80

## 2021-03-24 DIAGNOSIS — M5416 Radiculopathy, lumbar region: Secondary | ICD-10-CM

## 2021-03-24 MED ORDER — BETAMETHASONE SOD PHOS & ACET 6 (3-3) MG/ML IJ SUSP
12.0000 mg | Freq: Once | INTRAMUSCULAR | Status: AC
Start: 2021-03-24 — End: 2021-03-24
  Administered 2021-03-24: 12 mg

## 2021-03-24 NOTE — Progress Notes (Signed)
Pt state lower back pain that travels down her right to her foot. Pt state sitting for a long time makes the pain worse. Pt state driving makes it worse too. Pt state she use heat/ ice and massage help ease the pain.   Numeric Pain Rating Scale and Functional Assessment Average Pain 9   In the last MONTH (on 0-10 scale) has pain interfered with the following?  1. General activity like being  able to carry out your everyday physical activities such as walking, climbing stairs, carrying groceries, or moving a chair?  Rating(10)   +Driver, -BT, -Dye Allergies.

## 2021-03-24 NOTE — Patient Instructions (Signed)

## 2021-03-31 ENCOUNTER — Encounter: Payer: Self-pay | Admitting: Family Medicine

## 2021-03-31 ENCOUNTER — Encounter: Payer: Self-pay | Admitting: Physical Medicine and Rehabilitation

## 2021-03-31 DIAGNOSIS — M5416 Radiculopathy, lumbar region: Secondary | ICD-10-CM

## 2021-04-19 NOTE — Procedures (Signed)
Lumbar Epidural Steroid Injection - Interlaminar Approach with Fluoroscopic Guidance  Patient: Rachel Simmons      Date of Birth: 08-13-1966 MRN: [id number] PCP: Lorrene Reid, PA-C      Visit Date: 03/24/2021   Universal Protocol:     Consent Given By: the patient  Position: PRONE  Additional Comments: Vital signs were monitored before and after the procedure. Patient was prepped and draped in the usual sterile fashion. The correct patient, procedure, and site was verified.   Injection Procedure Details:   Procedure diagnoses: Lumbar radiculopathy [M54.16]   Meds Administered:  Meds ordered this encounter  Medications  . betamethasone acetate-betamethasone sodium phosphate (CELESTONE) injection 12 mg     Laterality: Right  Location/Site:  L4-L5  Needle: 3.5 in., 20 ga. Tuohy  Needle Placement: Paramedian epidural  Findings:   -Comments: Excellent flow of contrast into the epidural space.  Procedure Details: Using a paramedian approach from the side mentioned above, the region overlying the inferior lamina was localized under fluoroscopic visualization and the soft tissues overlying this structure were infiltrated with 4 ml. of 1% Lidocaine without Epinephrine. The Tuohy needle was inserted into the epidural space using a paramedian approach.   The epidural space was localized using loss of resistance along with counter oblique bi-planar fluoroscopic views.  After negative aspirate for air, blood, and CSF, a 2 ml. volume of Isovue-250 was injected into the epidural space and the flow of contrast was observed. Radiographs were obtained for documentation purposes.    The injectate was administered into the level noted above.   Additional Comments:  The patient tolerated the procedure well Dressing: 2 x 2 sterile gauze and Band-Aid    Post-procedure details: Patient was observed during the procedure. Post-procedure instructions were reviewed.  Patient left  the clinic in stable condition.

## 2021-04-19 NOTE — Progress Notes (Signed)
Rachel Simmons - 55 y.o. female MRN [id number]  Date of birth: 1966/04/08  Office Visit Note: Visit Date: 03/24/2021 PCP: Lorrene Reid, PA-C Referred by: Lorrene Reid, PA-C  Subjective: Chief Complaint  Patient presents with  . Lower Back - Pain  . Left Leg - Pain  . Left Foot - Pain   HPI:  Rachel Simmons is a 55 y.o. female who comes in today at the request of Dr. Eunice Blase for planned Right L4-L5 Lumbar Interlaminar epidural steroid injection with fluoroscopic guidance.  The patient has failed conservative care including home exercise, medications, time and activity modification.  This injection will be diagnostic and hopefully therapeutic.  Please see requesting physician notes for further details and justification. MRI reviewed with images and spine model.  MRI reviewed in the note below.  Patient has significant disc extrusion at L2-3 but with radicular complaints more consistent with L5 distribution.  This could all be from lateral recess narrowing at that level.  We will try a more simple L4-5 injection first but would consider transforaminal injection closer to the disc herniation.  Unfortunately this may be a surgical consideration.    ROS Otherwise per HPI.  Assessment & Plan: Visit Diagnoses:    ICD-10-CM   1. Lumbar radiculopathy  M54.16 XR C-ARM NO REPORT    Epidural Steroid injection    betamethasone acetate-betamethasone sodium phosphate (CELESTONE) injection 12 mg    Plan: No additional findings.   Meds & Orders:  Meds ordered this encounter  Medications  . betamethasone acetate-betamethasone sodium phosphate (CELESTONE) injection 12 mg    Orders Placed This Encounter  Procedures  . XR C-ARM NO REPORT  . Epidural Steroid injection    Follow-up: No follow-ups on file.   Procedures: No procedures performed  Lumbar Epidural Steroid Injection - Interlaminar Approach with Fluoroscopic Guidance  Patient: Rachel Simmons      Date of Birth:  March 16, 1966 MRN: [id number] PCP: Lorrene Reid, PA-C      Visit Date: 03/24/2021   Universal Protocol:     Consent Given By: the patient  Position: PRONE  Additional Comments: Vital signs were monitored before and after the procedure. Patient was prepped and draped in the usual sterile fashion. The correct patient, procedure, and site was verified.   Injection Procedure Details:   Procedure diagnoses: Lumbar radiculopathy [M54.16]   Meds Administered:  Meds ordered this encounter  Medications  . betamethasone acetate-betamethasone sodium phosphate (CELESTONE) injection 12 mg     Laterality: Right  Location/Site:  L4-L5  Needle: 3.5 in., 20 ga. Tuohy  Needle Placement: Paramedian epidural  Findings:   -Comments: Excellent flow of contrast into the epidural space.  Procedure Details: Using a paramedian approach from the side mentioned above, the region overlying the inferior lamina was localized under fluoroscopic visualization and the soft tissues overlying this structure were infiltrated with 4 ml. of 1% Lidocaine without Epinephrine. The Tuohy needle was inserted into the epidural space using a paramedian approach.   The epidural space was localized using loss of resistance along with counter oblique bi-planar fluoroscopic views.  After negative aspirate for air, blood, and CSF, a 2 ml. volume of Isovue-250 was injected into the epidural space and the flow of contrast was observed. Radiographs were obtained for documentation purposes.    The injectate was administered into the level noted above.   Additional Comments:  The patient tolerated the procedure well Dressing: 2 x 2 sterile gauze and Band-Aid    Post-procedure  details: Patient was observed during the procedure. Post-procedure instructions were reviewed.  Patient left the clinic in stable condition.     Clinical History: MRI LUMBAR SPINE WITHOUT CONTRAST   Technique: Multiplanar and multiecho  pulse sequences of the lumbar  spine were obtained without intravenous contrast.   Comparison: Report from prior study 03/20/2000.   Findings: The patient is reported to have five lumbar type  vertebral bodies. There is a minimal convex right scoliosis.  There is no evidence of fracture or pars defect. 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   The conus medullaris extends to the L1 level and appears normal.  There are no paraspinal abnormalities.   Sagittal images demonstrate mild disc degeneration at T11-T12. The  T12-L1 and L1-L2 disc space levels appear normal.   L2-L3: There is annular disc bulging with a right paracentral disc  extrusion. There is caudal migration of disc material and probable  epidural hemorrhage into the right L3 lateral recess (nearly to the  L3-L4 disc space level). There is significant mass effect on the  thecal sac which appears flattened and displaced posterolaterally  to the left. Right L3 nerve root encroachment is likely. There is  no foraminal compromise or L2 nerve root encroachment.   L3-L4: There is disc bulging with a small central disc protrusion  and mild facet hypertrophy. There is mild central and right  lateral recess stenosis. As above, the findings in the right L3  lateral recess appear to derive from the L2-L3 disc space level.   L4-L5: There is chronic disc degeneration with annular bulging and  osteophytes asymmetric to the right. There is mild facet and  ligamentous hypertrophy. There is mild central, lateral recess and  foraminal stenosis bilaterally.   L5-S1: There is a shallow central disc protrusion which is largely  contained within the ventral epidural fat. There is some asymmetry  of the S1 nerve root sleeves but no definite displacement. Mild  facet degenerative changes are present bilaterally. The foramina  appear sufficiently patent.   IMPRESSION:   1. Prior remote examination is not  available for direct  comparison.  2. The most significant findings are at L2-L3 where there is a  right paracentral disc extrusion with apparent caudal migration of  disc material and epidural hemorrhage into the right L3 lateral  recess. There is mass effect on the thecal sac and probable right  L3 nerve root encroachment.  3. Disc bulging and small central disc protrusion at L3-L4  contribute to mild central and mild right lateral recess stenosis.  4. Chronic disc degeneration and facet disease at L4-L5 with  resulting mild central, lateral recess and foraminal stenosis  bilaterally.  5. Shallow central disc protrusion at L5-S1 without significant  spinal stenosis or nerve root encroachment.   Original Report Authenticated By: Vivia Ewing, M.D.     Objective:  VS:  HT:    WT:   BMI:     BP:115/76  HR:80bpm  TEMP: ( )  RESP:  Physical Exam Vitals and nursing note reviewed.  Constitutional:      General: She is not in acute distress.    Appearance: Normal appearance. She is not ill-appearing.  HENT:     Head: Normocephalic and atraumatic.     Right Ear: External ear normal.     Left Ear: External ear normal.  Eyes:     Extraocular Movements: Extraocular movements intact.  Cardiovascular:     Rate and Rhythm:  Normal rate.     Pulses: Normal pulses.  Pulmonary:     Effort: Pulmonary effort is normal. No respiratory distress.  Abdominal:     General: There is no distension.     Palpations: Abdomen is soft.  Musculoskeletal:        General: Tenderness present.     Cervical back: Neck supple.     Right lower leg: No edema.     Left lower leg: No edema.     Comments: Patient has good distal strength with no pain over the greater trochanters.  No clonus or focal weakness.  Skin:    Findings: No erythema, lesion or rash.  Neurological:     General: No focal deficit present.     Mental Status: She is alert and oriented to person, place, and time.      Sensory: No sensory deficit.     Motor: No weakness or abnormal muscle tone.     Coordination: Coordination normal.  Psychiatric:        Mood and Affect: Mood normal.        Behavior: Behavior normal.      Imaging: No results found.

## 2021-04-24 ENCOUNTER — Ambulatory Visit (HOSPITAL_BASED_OUTPATIENT_CLINIC_OR_DEPARTMENT_OTHER)
Admission: RE | Admit: 2021-04-24 | Discharge: 2021-04-24 | Disposition: A | Discharge status: Home / Self Care | Source: Ambulatory Visit | Attending: Physical Medicine and Rehabilitation | Admitting: Physical Medicine and Rehabilitation

## 2021-04-24 ENCOUNTER — Other Ambulatory Visit: Payer: Self-pay

## 2021-04-24 DIAGNOSIS — M5416 Radiculopathy, lumbar region: Secondary | ICD-10-CM | POA: Insufficient documentation

## 2021-04-26 ENCOUNTER — Ambulatory Visit: Admitting: Physician Assistant

## 2021-04-27 ENCOUNTER — Telehealth: Payer: Self-pay | Admitting: Physical Medicine and Rehabilitation

## 2021-04-27 NOTE — Telephone Encounter (Signed)
Is auth needed for bilateral L5 TF? Scheduled for 6/6 with driver.

## 2021-04-27 NOTE — Telephone Encounter (Signed)
-----   Message from Magnus Sinning, MD sent at 04/26/2021  2:16 PM EDT ----- Regarding: post MRI Needs bilat L5 tf esi or OV if already scheduled

## 2021-04-28 NOTE — Telephone Encounter (Signed)
Pt not req auth#

## 2021-05-04 ENCOUNTER — Other Ambulatory Visit: Payer: Self-pay | Admitting: Oncology

## 2021-05-04 DIAGNOSIS — C9110 Chronic lymphocytic leukemia of B-cell type not having achieved remission: Secondary | ICD-10-CM

## 2021-05-04 NOTE — Progress Notes (Signed)
[facility]  [address] [place],  [place]  [id number] [phone]  Clinic Day:  05/05/2021  Referring physician: Lorrene Reid, PA-C  This document serves as a record of services personally performed by Marice Potter, MD. It was created on their behalf by Curry,Lauren E, a trained medical scribe. The creation of this record is based on the scribe's personal observations and the provider's statements to them.  HISTORY OF PRESENT ILLNESS:  Rachel Simmons is a 55 y.o. female with chronic lymphocytic leukemia, which was diagnosed per flow cytometry of her peripheral blood in June 2016.  As her disease has behaved very indolently, it has been followed conservatively, without any particular intervention.  She comes in today for routine follow up.  Since her last visit, the patient has been doing very well.  She denies having any B symptoms or bulky lymphadenopathy which concerns her for catabolic progression of her CLL.    VITALS:  Blood pressure 120/77, pulse 79, temperature 98.2 F (36.8 C), resp. rate 16, height 5\' 8"  (1.727 m), weight 254 lb 12.8 oz (115.6 kg), SpO2 98 %.  Wt Readings from Last 3 Encounters:  05/05/21 254 lb 12.8 oz (115.6 kg)  12/24/20 258 lb 3.2 oz (117.1 kg)  12/07/20 249 lb (112.9 kg)    Body mass index is 38.74 kg/m.  Performance status (ECOG): 0 - Asymptomatic  PHYSICAL EXAM:  Physical Exam Constitutional:      General: She is not in acute distress.    Appearance: Normal appearance. She is normal weight.  HENT:     Head: Normocephalic and atraumatic.  Eyes:     General: No scleral icterus.    Extraocular Movements: Extraocular movements intact.     Conjunctiva/sclera: Conjunctivae normal.     Pupils: Pupils are equal, round, and reactive to light.  Cardiovascular:     Rate and Rhythm: Normal rate and regular rhythm.     Pulses: Normal pulses.     Heart sounds: Normal heart sounds. No murmur heard. No friction  rub. No gallop.   Pulmonary:     Effort: Pulmonary effort is normal. No respiratory distress.     Breath sounds: Normal breath sounds.  Abdominal:     General: Bowel sounds are normal. There is no distension.     Palpations: Abdomen is soft. There is no hepatomegaly, splenomegaly or mass.     Tenderness: There is no abdominal tenderness.  Musculoskeletal:        General: Normal range of motion.     Cervical back: Normal range of motion and neck supple.     Right lower leg: No edema.     Left lower leg: No edema.  Lymphadenopathy:     Cervical: No cervical adenopathy.  Skin:    General: Skin is warm and dry.  Neurological:     General: No focal deficit present.     Mental Status: She is alert and oriented to person, place, and time. Mental status is at baseline.  Psychiatric:        Mood and Affect: Mood normal.        Behavior: Behavior normal.        Thought Content: Thought content normal.        Judgment: Judgment normal.     LABS:    CBC Latest Ref Rng & Units 05/05/2021 12/07/2020 09/11/2019  WBC - 18.5 20.1(HH) 21.2(HH)  Hemoglobin 12.0 - 16.0 12.7 12.3 12.5  Hematocrit 36 -  46 38 37.7 37.2  Platelets 150 - 399 228 251 260   CMP Latest Ref Rng & Units 12/07/2020 08/19/2020 09/11/2019  Glucose 65 - 99 mg/dL 127(H) 129(H) 91  BUN 6 - 24 mg/dL 15 13 17   Creatinine 0.57 - 1.00 mg/dL 0.53(L) 0.53(L) 0.61  Sodium 134 - 144 mmol/L 137 140 139  Potassium 3.5 - 5.2 mmol/L 4.2 4.3 4.3  Chloride 96 - 106 mmol/L 100 100 101  CO2 20 - 29 mmol/L 26 26 27   Calcium 8.7 - 10.2 mg/dL 10.3(H) 10.2 10.1  Total Protein 6.0 - 8.5 g/dL 6.4 6.7 6.5  Total Bilirubin 0.0 - 1.2 mg/dL 0.3 0.4 0.4  Alkaline Phos 44 - 121 IU/L 69 76 65  AST 0 - 40 IU/L 18 21 20   ALT 0 - 32 IU/L 26 25 22    ASSESSMENT & PLAN:  A 55 year old woman with chronic lymphocytic leukemia.  There remains nothing per her labs or physical exam today which suggests her disease is significantly progressing to where intervention  is needed.  Clinically, the patient continues to do very well.  I will see this patient back in 1 year for repeat clinical assessment.  The patient understands all the plans discussed today and is in agreement with them.    I, Rita Ohara, am acting as scribe for Marice Potter, MD    I have reviewed this report as typed by the medical scribe, and it is complete and accurate.  Dequincy Macarthur Critchley, MD

## 2021-05-05 ENCOUNTER — Inpatient Hospital Stay (INDEPENDENT_AMBULATORY_CARE_PROVIDER_SITE_OTHER): Admitting: Oncology

## 2021-05-05 ENCOUNTER — Encounter: Payer: Self-pay | Admitting: Oncology

## 2021-05-05 ENCOUNTER — Telehealth: Payer: Self-pay | Admitting: Oncology

## 2021-05-05 ENCOUNTER — Inpatient Hospital Stay: Attending: Oncology

## 2021-05-05 ENCOUNTER — Other Ambulatory Visit: Payer: Self-pay

## 2021-05-05 ENCOUNTER — Other Ambulatory Visit: Payer: Self-pay | Admitting: Oncology

## 2021-05-05 VITALS — BP 120/77 | HR 79 | Temp 98.2°F | Resp 16 | Ht 68.0 in | Wt 254.8 lb

## 2021-05-05 DIAGNOSIS — C9110 Chronic lymphocytic leukemia of B-cell type not having achieved remission: Secondary | ICD-10-CM

## 2021-05-05 LAB — CBC AND DIFFERENTIAL
HCT: 38 (ref 36–46)
Hemoglobin: 12.7 (ref 12.0–16.0)
Neutrophils Absolute: 4.44
Platelets: 228 (ref 150–399)
WBC: 18.5

## 2021-05-05 LAB — CBC: RBC: 4.48 (ref 3.87–5.11)

## 2021-05-05 NOTE — Telephone Encounter (Signed)
Per 6/1 los next appt scheduled and given to patient

## 2021-05-10 ENCOUNTER — Ambulatory Visit: Payer: Self-pay

## 2021-05-10 ENCOUNTER — Ambulatory Visit (INDEPENDENT_AMBULATORY_CARE_PROVIDER_SITE_OTHER): Admitting: Physical Medicine and Rehabilitation

## 2021-05-10 ENCOUNTER — Encounter: Payer: Self-pay | Admitting: Physical Medicine and Rehabilitation

## 2021-05-10 ENCOUNTER — Other Ambulatory Visit: Payer: Self-pay

## 2021-05-10 VITALS — BP 109/74 | HR 76

## 2021-05-10 DIAGNOSIS — M5416 Radiculopathy, lumbar region: Secondary | ICD-10-CM | POA: Diagnosis not present

## 2021-05-10 MED ORDER — METHYLPREDNISOLONE ACETATE 80 MG/ML IJ SUSP
80.0000 mg | Freq: Once | INTRAMUSCULAR | Status: AC
  Administered 2021-05-10: 80 mg

## 2021-05-10 NOTE — Patient Instructions (Signed)

## 2021-05-10 NOTE — Progress Notes (Signed)
Pt state lower back pain the travels to her buttock and down the right leg and foot. Pt state sitting and driving makes the pain worse. Pt state she takes over the counter pain meds and heating and ice to help ease her pain. Pt has hx of inj on 03/24/21 pt state it helped for two days.  Numeric Pain Rating Scale and Functional Assessment Average Pain 7   In the last MONTH (on 0-10 scale) has pain interfered with the following?  1. General activity like being  able to carry out your everyday physical activities such as walking, climbing stairs, carrying groceries, or moving a chair?  Rating(10)   +Driver, -BT, -Dye Allergies.

## 2021-05-13 NOTE — Progress Notes (Signed)
Rachel Simmons - 55 y.o. female MRN [id number]  Date of birth: [birth year]  Office Visit Note: Visit Date: 05/10/2021 PCP: Lorrene Reid, PA-C Referred by: Lorrene Reid, PA-C  Subjective: Chief Complaint  Patient presents with   Lower Back - Pain   Right Leg - Pain   Right Foot - Pain   HPI:  Rachel Simmons is a 55 y.o. female who comes in today For MRI review and possible interventional spine procedure.  As our previous notes indicated she has really failed all manner of conservative care he continues with home exercise program including core strengthening and stretching.  She has had physical therapy she has not had any back surgery in fact had a pretty bad disc herniation several years ago that we treated with epidural injection.  Because of more symptoms right more than left in the buttock and leg more of an S1 distribution we decided to complete MRI update.  This shows improvement at the L2-3 disc herniation where hematoma now has resolved and there is some involution of the disc itself.  There is still left posterior lateral disc at L1-2 and L2-3 but without compression.  At L3-4 and L4-5 she has multifactorial moderate stenosis with disc herniation but no nerve compression.  L5-S1 is of some lateral recess narrowing but otherwise pretty good.  With her symptoms being S1 and distribution I think the right answer would be a right S1 transforaminal injection with potential for diagnostic injection at the level of stenosis at L3-4 L4-5.  Hopefully she will do well with this we will see her back as needed.  ROS Otherwise per HPI.  Assessment & Plan: Visit Diagnoses:    ICD-10-CM   1. Lumbar radiculopathy  M54.16 XR C-ARM NO REPORT    Epidural Steroid injection    methylPREDNISolone acetate (DEPO-MEDROL) injection 80 mg      Plan: No additional findings.   Meds & Orders:  Meds ordered this encounter  Medications   methylPREDNISolone acetate (DEPO-MEDROL) injection 80 mg     Orders Placed This Encounter  Procedures   XR C-ARM NO REPORT   Epidural Steroid injection    Follow-up: No follow-ups on file.   Procedures: No procedures performed  S1 Lumbosacral Transforaminal Epidural Steroid Injection - Sub-Pedicular Approach with Fluoroscopic Guidance   Patient: Rachel Simmons      Date of Birth: 07/29/1966 MRN: [id number] PCP: Lorrene Reid, PA-C      Visit Date: 05/10/2021   Universal Protocol:    Date/Time: 05/14/2211:26 PM  Consent Given By: the patient  Position:  PRONE  Additional Comments: Vital signs were monitored before and after the procedure. Patient was prepped and draped in the usual sterile fashion. The correct patient, procedure, and site was verified.   Injection Procedure Details:  Procedure Site One Meds Administered:  Meds ordered this encounter  Medications   methylPREDNISolone acetate (DEPO-MEDROL) injection 80 mg    Laterality: Right  Location/Site:  S1 Foramen   Needle size: 22 ga.  Needle type: Spinal  Needle Placement: Transforaminal  Findings:   -Comments: Excellent flow of contrast along the nerve, nerve root and into the epidural space.  Epidurogram: Contrast epidurogram showed no nerve root cut off or restricted flow pattern.  Procedure Details: After squaring off the sacral end-plate to get a true AP view, the C-arm was positioned so that the best possible view of the S1 foramen was visualized. The soft tissues overlying this structure were infiltrated with 2-3 ml. of  1% Lidocaine without Epinephrine.    The spinal needle was inserted toward the target using a "trajectory" view along the fluoroscope beam.  Under AP and lateral visualization, the needle was advanced so it did not puncture dura. Biplanar projections were used to confirm position. Aspiration was confirmed to be negative for CSF and/or blood. A 1-2 ml. volume of Isovue-250 was injected and flow of contrast was noted at each level.  Radiographs were obtained for documentation purposes.   After attaining the desired flow of contrast documented above, a 0.5 to 1.0 ml test dose of 0.25% Marcaine was injected into each respective transforaminal space.  The patient was observed for 90 seconds post injection.  After no sensory deficits were reported, and normal lower extremity motor function was noted,   the above injectate was administered so that equal amounts of the injectate were placed at each foramen (level) into the transforaminal epidural space.   Additional Comments:  The patient tolerated the procedure well Dressing: Band-Aid with 2 x 2 sterile gauze    Post-procedure details: Patient was observed during the procedure. Post-procedure instructions were reviewed.  Patient left the clinic in stable condition.   Clinical History: MRI LUMBAR SPINE WITHOUT CONTRAST   TECHNIQUE: Multiplanar, multisequence MR imaging of the lumbar spine was performed. No intravenous contrast was administered.   COMPARISON:  Radiography 02/15/2021.  MRI 04/24/2011.   FINDINGS: Segmentation:  5 lumbar type vertebral bodies.   Alignment: Very minimal curvature convex to the right with the apex at L3.   Vertebrae: No fracture or primary bone lesion. No active discogenic edema.   Conus medullaris and cauda equina: Conus extends to the L1 level. Conus and cauda equina appear normal.   Paraspinal and other soft tissues: Normal   Disc levels:   Minimal non-compressive disc bulges at T11-12 and T12-L1.   L1-2: Tiny left posterolateral disc herniation with a very tiny fragment migrated upward to the left of midline behind L1. This is unlikely to be symptomatic.   L2-3: Chronic broad-based disc herniation with caudal down turning behind the superior endplate of L3. This has desiccated and involuted slightly since the study of 2012. Previously, there was disc material or epidural hematoma extending extensively behind the L3  vertebral body to the right of midline. There is mild stenosis of both lateral recesses, right more than left, but without definite neural compression.   L3-4: Chronic broad-based disc herniation more prominent towards the right. Mild facet and ligamentous hypertrophy. Moderate stenosis of the right lateral recess. The appearance is improved compared to the study of 2012 however.   L4-5: Endplate osteophytes and bulging of the disc. Mild facet and ligamentous hypertrophy. Multifactorial spinal stenosis, most pronounced in the lateral recesses, or neural compression could occur on either or both sides. Mild foraminal stenosis on the right. Stenosis of this level is worsened compared to the study of 20/12.   L5-S1: Endplate osteophytes and shallow protrusion of the disc more prominent towards the left. No compressive canal stenosis. Mild foraminal narrowing on the left, not visibly compressive.   IMPRESSION: L1-2: Tiny left posterolateral disc herniation with a tiny fragment migrated upward in the left lateral recess. This is so small it is unlikely to be significant.   L2-3: Chronic broad-based disc herniation with caudal down turning, slightly involuted compared to the study of 2012. Previously seen disc material or hematoma migrated caudally on the right behind L3 is gone. Mild stenosis of both lateral recesses, right more than left.  L3-4: Chronic broad-based right posterolateral predominant disc herniation with moderate stenosis of the right lateral recess. Slight improvement since 2012.   L4-5: Chronic disc degeneration with further loss of disc height. Endplate osteophytes and bulging of the disc. Facet and ligamentous hypertrophy. Moderate multifactorial stenosis, slightly worsened since 2012. Neural compression could occur in either lateral recess.   L5-S1: Chronic endplate osteophytes and shallow disc protrusion more prominent towards the left without likely neural  compression.     Electronically Signed   By: Nelson Chimes M.D.   On: 04/26/2021 08:53     Objective:  VS:  HT:    WT:   BMI:     BP:109/74  HR:76bpm  TEMP: ( )  RESP:  Physical Exam Vitals and nursing note reviewed.  Constitutional:      General: She is not in acute distress.    Appearance: Normal appearance. She is obese. She is not ill-appearing.  HENT:     Head: Normocephalic and atraumatic.     Right Ear: External ear normal.     Left Ear: External ear normal.  Eyes:     Extraocular Movements: Extraocular movements intact.  Cardiovascular:     Rate and Rhythm: Normal rate.     Pulses: Normal pulses.  Pulmonary:     Effort: Pulmonary effort is normal. No respiratory distress.  Abdominal:     General: There is no distension.     Palpations: Abdomen is soft.  Musculoskeletal:        General: Tenderness present.     Cervical back: Neck supple.     Right lower leg: No edema.     Left lower leg: No edema.     Comments: Patient has good distal strength with no pain over the greater trochanters.  No clonus or focal weakness.  Skin:    Findings: No erythema, lesion or rash.  Neurological:     General: No focal deficit present.     Mental Status: She is alert and oriented to person, place, and time.     Sensory: No sensory deficit.     Motor: No weakness or abnormal muscle tone.     Coordination: Coordination normal.  Psychiatric:        Mood and Affect: Mood normal.        Behavior: Behavior normal.     Imaging: No results found.

## 2021-05-13 NOTE — Procedures (Signed)
S1 Lumbosacral Transforaminal Epidural Steroid Injection - Sub-Pedicular Approach with Fluoroscopic Guidance   Patient: Rachel Simmons      Date of Birth: [birth year] MRN: [id number] PCP: Lorrene Reid, PA-C      Visit Date: 05/10/2021   Universal Protocol:    Date/Time: 05/14/2211:26 PM  Consent Given By: the patient  Position:  PRONE  Additional Comments: Vital signs were monitored before and after the procedure. Patient was prepped and draped in the usual sterile fashion. The correct patient, procedure, and site was verified.   Injection Procedure Details:  Procedure Site One Meds Administered:  Meds ordered this encounter  Medications   methylPREDNISolone acetate (DEPO-MEDROL) injection 80 mg    Laterality: Right  Location/Site:  S1 Foramen   Needle size: 22 ga.  Needle type: Spinal  Needle Placement: Transforaminal  Findings:   -Comments: Excellent flow of contrast along the nerve, nerve root and into the epidural space.  Epidurogram: Contrast epidurogram showed no nerve root cut off or restricted flow pattern.  Procedure Details: After squaring off the sacral end-plate to get a true AP view, the C-arm was positioned so that the best possible view of the S1 foramen was visualized. The soft tissues overlying this structure were infiltrated with 2-3 ml. of 1% Lidocaine without Epinephrine.    The spinal needle was inserted toward the target using a "trajectory" view along the fluoroscope beam.  Under AP and lateral visualization, the needle was advanced so it did not puncture dura. Biplanar projections were used to confirm position. Aspiration was confirmed to be negative for CSF and/or blood. A 1-2 ml. volume of Isovue-250 was injected and flow of contrast was noted at each level. Radiographs were obtained for documentation purposes.   After attaining the desired flow of contrast documented above, a 0.5 to 1.0 ml test dose of 0.25% Marcaine was injected into  each respective transforaminal space.  The patient was observed for 90 seconds post injection.  After no sensory deficits were reported, and normal lower extremity motor function was noted,   the above injectate was administered so that equal amounts of the injectate were placed at each foramen (level) into the transforaminal epidural space.   Additional Comments:  The patient tolerated the procedure well Dressing: Band-Aid with 2 x 2 sterile gauze    Post-procedure details: Patient was observed during the procedure. Post-procedure instructions were reviewed.  Patient left the clinic in stable condition.

## 2021-06-01 ENCOUNTER — Other Ambulatory Visit: Payer: Self-pay | Admitting: Physician Assistant

## 2021-06-01 DIAGNOSIS — E1159 Type 2 diabetes mellitus with other circulatory complications: Secondary | ICD-10-CM

## 2021-06-16 ENCOUNTER — Other Ambulatory Visit: Payer: Self-pay | Admitting: Physician Assistant

## 2021-06-16 ENCOUNTER — Telehealth: Payer: Self-pay | Admitting: Physician Assistant

## 2021-06-16 DIAGNOSIS — E038 Other specified hypothyroidism: Secondary | ICD-10-CM

## 2021-06-16 NOTE — Telephone Encounter (Signed)
Please contact patient to schedule per last AVS for medication refills. AS, CMA

## 2021-06-17 ENCOUNTER — Other Ambulatory Visit: Payer: Self-pay | Admitting: Physician Assistant

## 2021-06-17 DIAGNOSIS — E1169 Type 2 diabetes mellitus with other specified complication: Secondary | ICD-10-CM

## 2021-06-18 ENCOUNTER — Telehealth: Payer: Self-pay | Admitting: Physician Assistant

## 2021-06-18 NOTE — Telephone Encounter (Signed)
Patient's pharmacy states she has a limited supply of Baclofen and needs a three month supply refill. They need approval for the refill. [phone] is their phone number. They also state it can be faxed to [phone]. Thanks

## 2021-06-18 NOTE — Telephone Encounter (Signed)
Patient does not have Baclofen on her current med list. Patient also needs apt for med refills. AS, CMA

## 2021-06-30 ENCOUNTER — Ambulatory Visit (INDEPENDENT_AMBULATORY_CARE_PROVIDER_SITE_OTHER): Admitting: Physician Assistant

## 2021-06-30 ENCOUNTER — Encounter: Payer: Self-pay | Admitting: Physician Assistant

## 2021-06-30 ENCOUNTER — Other Ambulatory Visit: Payer: Self-pay

## 2021-06-30 VITALS — BP 113/72 | HR 78 | Temp 98.5°F | Ht 70.0 in | Wt 265.0 lb

## 2021-06-30 DIAGNOSIS — E1169 Type 2 diabetes mellitus with other specified complication: Secondary | ICD-10-CM

## 2021-06-30 DIAGNOSIS — F339 Major depressive disorder, recurrent, unspecified: Secondary | ICD-10-CM

## 2021-06-30 DIAGNOSIS — E785 Hyperlipidemia, unspecified: Secondary | ICD-10-CM

## 2021-06-30 DIAGNOSIS — E1159 Type 2 diabetes mellitus with other circulatory complications: Secondary | ICD-10-CM

## 2021-06-30 DIAGNOSIS — I152 Hypertension secondary to endocrine disorders: Secondary | ICD-10-CM

## 2021-06-30 LAB — POCT GLYCOSYLATED HEMOGLOBIN (HGB A1C): Hemoglobin A1C: 6.1 % — AB (ref 4.0–5.6)

## 2021-06-30 NOTE — Assessment & Plan Note (Signed)
-  PHQ-9 score of 0. -Continue fluoxetine. -Will continue to monitor.

## 2021-06-30 NOTE — Assessment & Plan Note (Signed)
-  Controlled. -Continue current medication regimen. Will collect CMP for medication monitoring with fasting lab visit. -Will continue to monitor.

## 2021-06-30 NOTE — Assessment & Plan Note (Signed)
>>  ASSESSMENT AND PLAN FOR DEPRESSED MOOD, RECURRENT WRITTEN ON 06/30/2021  4:56 PM BY ABONZA, MARITZA, PA-C  -PHQ-9 score of 0. -Continue fluoxetine. -Will continue to monitor.

## 2021-06-30 NOTE — Progress Notes (Signed)
Established Patient Office Visit  Subjective:  Patient ID: Rachel Simmons, female    DOB: 1966/09/10  Age: 55 y.o. MRN: [id number]  CC:  Chief Complaint  Patient presents with   Follow-up   Hypertension   Hyperlipidemia   Diabetes    HPI [name] presents for follow-up on diabetes mellitus, hypertension and hyperlipidemia.  Diabetes mellitus: Pt denies increased urination or thirst. Pt reports medication compliance. No hypoglycemic events. Checking glucose at home.  Reports fasting blood sugars have been good, 85-100.  Patient reports has made some dietary changes and is trying to incorporate more sugar-free food items.  Staying active with helping her son work outside.  HTN: Pt denies chest pain, palpitations, dizziness, orthopnea or edema taking medication as directed without side effects.  Patient states is staying hydrated especially when working outside.  HLD: Pt taking medication as directed without issues. Has been working on following a low fat diet. States has changed to zero sugar greek yogurt and eating more salads.      Past Medical History:  Diagnosis Date   Back pain    Chronic lymphoblastic leukemia    Diabetes mellitus without complication (HCC)    Edema, lower extremity    Family history of breast cancer    Family history of colon cancer    Family history of polyps in the colon    Hypertension    Joint pain    Prediabetes    Thyroid disease     Past Surgical History:  Procedure Laterality Date   ABDOMINAL HYSTERECTOMY  2006   reports ovaries and cervix are intact   TONSILLECTOMY      Family History  Problem Relation Age of Onset   Breast cancer Mother        65s; deceased 55   Colon polyps Mother        Dx 86s; #/type unknown   Diabetes Mother    Heart disease Mother    Thyroid disease Mother    Sleep apnea Mother    Obesity Mother    Cancer Father        lung cancer; smoker; deceased 64   Alcoholism Father    Breast cancer  Sister 91       triple negative; currently 38   Cancer Maternal Aunt        lung cancer; smoker; currently 68   Colon cancer Maternal Uncle        Dx 41s; currently 38   Breast cancer Paternal Aunt        Dx 77s; currently 72s   Cancer Paternal Uncle        liver; heavy drinker   Cancer Maternal Grandfather        unk. primary; deceased 23s   Cancer Paternal Grandmother        Dx 12s; unknown primary   Breast cancer Sister 5       currently 56   Uterine cancer Sister 44       currently 15   Cancer Paternal Uncle        lung; heavy smoker   Breast cancer Cousin        mat first cousin through aunt; poss. breast ca; had mastectomy; currently 50   Ovarian cancer Other        mat grandmother's sister   Cancer Other        stomach cancer; mat grandmother's sister   Colon polyps Brother  age, #, type unknown    Social History   Socioeconomic History   Marital status: Widowed    Spouse name: Not on file   Number of children: Not on file   Years of education: Not on file   Highest education level: Not on file  Occupational History   Occupation: Sous Chef  Tobacco Use   Smoking status: Never   Smokeless tobacco: Never  Vaping Use   Vaping Use: Never used  Substance and Sexual Activity   Alcohol use: No   Drug use: No   Sexual activity: Yes    Birth control/protection: None  Other Topics Concern   Not on file  Social History Narrative   Not on file   Social Determinants of Health   Financial Resource Strain: Not on file  Food Insecurity: Not on file  Transportation Needs: Not on file  Physical Activity: Not on file  Stress: Not on file  Social Connections: Not on file  Intimate Partner Violence: Not on file    Outpatient Medications Prior to Visit  Medication Sig Dispense Refill   aspirin 81 MG chewable tablet Chew 81 mg by mouth daily.     atorvastatin (LIPITOR) 10 MG tablet Take 0.5 tablets (5 mg total) by mouth at bedtime. 45 tablet 0   baclofen  (LIORESAL) 10 MG tablet Take 1 tablet (10 mg total) by mouth 2 (two) times daily as needed for muscle spasms. 30 tablet 0   calcium carbonate (OS-CAL) 600 MG TABS tablet Take 1 tablet by mouth daily.     cetirizine (ZYRTEC) 5 MG tablet Take 1 tablet by mouth daily.     FLUoxetine (PROZAC) 20 MG capsule TAKE 1 CAPSULE DAILY 90 capsule 1   glucose blood (FREESTYLE LITE) test strip CHECK FASTING BLOOD SUGAR AND 2 HOURS AFTER A MEAL 200 each 3   levothyroxine (LEVOXYL) 112 MCG tablet Take 1 tablet (112 mcg total) by mouth daily before breakfast. **NEEDS APT FOR REFILLS** 30 tablet 0   lisinopril-hydrochlorothiazide (ZESTORETIC) 20-25 MG tablet TAKE 1 TABLET DAILY 90 tablet 0   metFORMIN (GLUCOPHAGE) 1000 MG tablet Take 1 tablet (1,000 mg total) by mouth 2 (two) times daily with a meal. **NEEDS APT FOR REFILLS** 60 tablet 0   Multiple Vitamins-Minerals (ONE-A-DAY WOMENS 50 PLUS PO) Take 1 tablet daily by mouth.     Omega-3 Fatty Acids (FISH OIL) 1200 MG CAPS Take 4-5 tabs daily     Vitamin D, Ergocalciferol, (DRISDOL) 1.25 MG (50000 UNIT) CAPS capsule TAKE 1 CAPSULE EVERY 7 DAYS 12 capsule 3   tiZANidine (ZANAFLEX) 2 MG tablet Take 1-2 tablets (2-4 mg total) by mouth every 6 (six) hours as needed for muscle spasms. 60 tablet 1   No facility-administered medications prior to visit.    No Known Allergies  ROS Review of Systems Review of Systems:  A fourteen system review of systems was performed and found to be positive as per HPI.   Objective:    Physical Exam General:  Well Developed, well nourished, appropriate for stated age.  Neuro:  Alert and oriented,  extra-ocular muscles intact  HEENT:  Normocephalic, atraumatic, conjunctiva slightly erythematous, PERL, neck supple Skin:  no gross rash, warm, pink. Cardiac:  RRR Respiratory:  ECTA B/L w/o wheezing, crackles or rales, Not using accessory muscles, speaking in full sentences- unlabored. Vascular:  Ext warm, no cyanosis apprec.; cap RF  less 2 sec. Psych:  No HI/SI, judgement and insight good, Euthymic mood. Full Affect.  BP 113/72  Pulse 78   Temp 98.5 F (36.9 C)   Ht '5\' 10"'$  (1.778 m)   Wt 265 lb (120.2 kg)   SpO2 97%   BMI 38.02 kg/m  Wt Readings from Last 3 Encounters:  06/30/21 265 lb (120.2 kg)  05/05/21 254 lb 12.8 oz (115.6 kg)  12/24/20 258 lb 3.2 oz (117.1 kg)     Health Maintenance Due  Topic Date Due   Hepatitis C Screening  Never done   Zoster Vaccines- Shingrix (1 of 2) Never done   Pneumococcal Vaccine 19-73 Years old (2 - PCV) 10/18/2017   COVID-19 Vaccine (3 - Pfizer risk series) 04/21/2020   OPHTHALMOLOGY EXAM  09/17/2020    There are no preventive care reminders to display for this patient.  Lab Results  Component Value Date   TSH 1.490 12/07/2020   Lab Results  Component Value Date   WBC 18.5 05/05/2021   HGB 12.7 05/05/2021   HCT 38 05/05/2021   MCV 84 12/07/2020   PLT 228 05/05/2021   Lab Results  Component Value Date   NA 137 12/07/2020   K 4.2 12/07/2020   CO2 26 12/07/2020   GLUCOSE 127 (H) 12/07/2020   BUN 15 12/07/2020   CREATININE 0.53 (L) 12/07/2020   BILITOT 0.3 12/07/2020   ALKPHOS 69 12/07/2020   AST 18 12/07/2020   ALT 26 12/07/2020   PROT 6.4 12/07/2020   ALBUMIN 4.1 12/07/2020   CALCIUM 10.3 (H) 12/07/2020   Lab Results  Component Value Date   CHOL 144 12/07/2020   Lab Results  Component Value Date   HDL 60 12/07/2020   Lab Results  Component Value Date   LDLCALC 59 12/07/2020   Lab Results  Component Value Date   TRIG 149 12/07/2020   Lab Results  Component Value Date   CHOLHDL 2.4 12/07/2020   Lab Results  Component Value Date   HGBA1C 6.1 (A) 06/30/2021      Assessment & Plan:   Problem List Items Addressed This Visit       Cardiovascular and Mediastinum   Hypertension associated with diabetes ([place]) (Chronic)    -Controlled. -Continue current medication regimen. Will collect CMP for medication monitoring with fasting lab  visit. -Will continue to monitor.         Endocrine   Hyperlipidemia associated with type 2 diabetes mellitus (HCC) (Chronic)    -Last lipid panel: Total cholesterol 144, triglycerides 149, HDL 60, LDL 59 (at goal<70) -Continue current medication regimen.  Advised patient to schedule fasting lab visit to repeat lipid panel and hepatic function. -Encouraged to continue with dietary changes and follow a low-fat diet. -Will continue to monitor.       Diabetes mellitus ([place]) - Primary    -A1c has improved from 6.6 to 6.1, will continue current medication regimen. -Continue ambulatory glucose monitoring and low carbohydrate and glucose diet. -Unable to provide urine sample, will obtain microalbumin with lab visit. -Will continue to monitor.       Relevant Orders   POCT HgB A1C (Completed)     Other   depressed mood, recurrent (Chronic)    -PHQ-9 score of 0. -Continue fluoxetine. -Will continue to monitor.         No orders of the defined types were placed in this encounter.   Follow-up: Return in about 4 months (around 10/31/2021) for DM, HTN, HLD, mood; lab visit in 1-4 weeks for FBW and ua microalbumin .   Note:  This note was prepared  with assistance of Systems analyst. Occasional wrong-word or sound-a-like substitutions may have occurred due to the inherent limitations of voice recognition software.  Lorrene Reid, PA-C

## 2021-06-30 NOTE — Assessment & Plan Note (Signed)
>>  ASSESSMENT AND PLAN FOR DIABETES MELLITUS WRITTEN ON 06/30/2021  5:00 PM BY ABONZA, MARITZA, PA-C  -A1c has improved from 6.6 to 6.1, will continue current medication regimen. -Continue ambulatory glucose monitoring and low carbohydrate and glucose diet. -Unable to provide urine sample, will obtain microalbumin with lab visit. -Will continue to monitor.

## 2021-06-30 NOTE — Patient Instructions (Signed)

## 2021-06-30 NOTE — Assessment & Plan Note (Signed)
-  A1c has improved from 6.6 to 6.1, will continue current medication regimen. -Continue ambulatory glucose monitoring and low carbohydrate and glucose diet. -Unable to provide urine sample, will obtain microalbumin with lab visit. -Will continue to monitor.

## 2021-06-30 NOTE — Assessment & Plan Note (Signed)
-  Last lipid panel: Total cholesterol 144, triglycerides 149, HDL 60, LDL 59 (at goal<70) -Continue current medication regimen.  Advised patient to schedule fasting lab visit to repeat lipid panel and hepatic function. -Encouraged to continue with dietary changes and follow a low-fat diet. -Will continue to monitor.

## 2021-07-02 ENCOUNTER — Other Ambulatory Visit: Payer: Self-pay | Admitting: Physician Assistant

## 2021-07-02 DIAGNOSIS — E038 Other specified hypothyroidism: Secondary | ICD-10-CM

## 2021-07-02 DIAGNOSIS — E1169 Type 2 diabetes mellitus with other specified complication: Secondary | ICD-10-CM

## 2021-07-05 ENCOUNTER — Other Ambulatory Visit: Payer: Self-pay | Admitting: Physician Assistant

## 2021-07-05 DIAGNOSIS — F4323 Adjustment disorder with mixed anxiety and depressed mood: Secondary | ICD-10-CM

## 2021-08-15 ENCOUNTER — Other Ambulatory Visit: Payer: Self-pay | Admitting: Physician Assistant

## 2021-08-15 DIAGNOSIS — E1169 Type 2 diabetes mellitus with other specified complication: Secondary | ICD-10-CM

## 2021-08-15 DIAGNOSIS — E038 Other specified hypothyroidism: Secondary | ICD-10-CM

## 2021-08-16 MED ORDER — LEVOTHYROXINE SODIUM 112 MCG PO TABS: 112.0000 ug | ORAL_TABLET | Freq: Every day | ORAL | 0 refills | Status: DC

## 2021-08-16 MED ORDER — METFORMIN HCL 1000 MG PO TABS: 1000.0000 mg | ORAL_TABLET | Freq: Two times a day (BID) | ORAL | 0 refills | Status: DC

## 2021-08-30 ENCOUNTER — Other Ambulatory Visit: Payer: Self-pay | Admitting: Physician Assistant

## 2021-08-30 DIAGNOSIS — E1159 Type 2 diabetes mellitus with other circulatory complications: Secondary | ICD-10-CM

## 2021-08-30 DIAGNOSIS — I152 Hypertension secondary to endocrine disorders: Secondary | ICD-10-CM

## 2021-10-25 ENCOUNTER — Other Ambulatory Visit: Payer: Self-pay

## 2021-10-25 ENCOUNTER — Encounter: Payer: Self-pay | Admitting: Physician Assistant

## 2021-10-25 ENCOUNTER — Ambulatory Visit (INDEPENDENT_AMBULATORY_CARE_PROVIDER_SITE_OTHER): Admitting: Physician Assistant

## 2021-10-25 VITALS — BP 101/69 | HR 88 | Temp 96.0°F | Ht 70.0 in | Wt 251.0 lb

## 2021-10-25 DIAGNOSIS — J018 Other acute sinusitis: Secondary | ICD-10-CM

## 2021-10-25 DIAGNOSIS — R051 Acute cough: Secondary | ICD-10-CM | POA: Diagnosis not present

## 2021-10-25 DIAGNOSIS — H6692 Otitis media, unspecified, left ear: Secondary | ICD-10-CM | POA: Diagnosis not present

## 2021-10-25 MED ORDER — BENZONATATE 200 MG PO CAPS: 200.0000 mg | ORAL_CAPSULE | Freq: Three times a day (TID) | ORAL | 0 refills | Status: DC | PRN

## 2021-10-25 MED ORDER — AMOXICILLIN-POT CLAVULANATE 875-125 MG PO TABS
1.0000 | ORAL_TABLET | Freq: Two times a day (BID) | ORAL | 0 refills | Status: DC
Start: 2021-10-25 — End: 2022-02-18

## 2021-10-25 NOTE — Patient Instructions (Signed)

## 2021-10-25 NOTE — Progress Notes (Signed)
Acute Office Visit  Subjective:    Patient ID: Rachel Simmons, female    DOB: 12-06-1965, 55 y.o.   MRN: [id number]  Chief Complaint  Patient presents with   Acute Visit    HPI Patient is in today for c/o feeling achy, chest pressure, unilateral sinus pressure, headache, nasal and chest congestion, dry cough, sore throat, mild runny nose and bilateral earache. Symptoms started last Tuesday. Has tried DayQuil and Nyquil. Nyquil has helped with nighttime symptoms and has been able to sleep. Recently started Mucinex which has helped with chest congestion. Saturday did Covid-test which resulted negative. Alternating Tylenol and Ibuprofen. No fever, n/v/d, or chest pain.  Past Medical History:  Diagnosis Date   Back pain    Chronic lymphoblastic leukemia    Diabetes mellitus without complication (HCC)    Edema, lower extremity    Family history of breast cancer    Family history of colon cancer    Family history of polyps in the colon    Hypertension    Joint pain    Prediabetes    Thyroid disease     Past Surgical History:  Procedure Laterality Date   ABDOMINAL HYSTERECTOMY  2006   reports ovaries and cervix are intact   TONSILLECTOMY      Family History  Problem Relation Age of Onset   Breast cancer Mother        2s; deceased 33   Colon polyps Mother        Dx 7s; #/type unknown   Diabetes Mother    Heart disease Mother    Thyroid disease Mother    Sleep apnea Mother    Obesity Mother    Cancer Father        lung cancer; smoker; deceased 81   Alcoholism Father    Breast cancer Sister 3       triple negative; currently 61   Cancer Maternal Aunt        lung cancer; smoker; currently 89   Colon cancer Maternal Uncle        Dx 82s; currently 32   Breast cancer Paternal Aunt        Dx 38s; currently 20s   Cancer Paternal Uncle        liver; heavy drinker   Cancer Maternal Grandfather        unk. primary; deceased 63s   Cancer Paternal Grandmother         Dx 32s; unknown primary   Breast cancer Sister 41       currently 4   Uterine cancer Sister 68       currently 37   Cancer Paternal Uncle        lung; heavy smoker   Breast cancer Cousin        mat first cousin through aunt; poss. 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  Tobacco Use   Smoking  status: Never   Smokeless tobacco: Never  Vaping Use   Vaping Use: Never used  Substance and Sexual Activity   Alcohol use: No   Drug use: No   Sexual activity: Yes    Birth control/protection: None  Other Topics Concern   Not on file  Social History Narrative   Not on file   Social Determinants of Health   Financial Resource Strain: Not on file  Food Insecurity: Not on file  Transportation Needs: Not on file  Physical Activity: Not on file  Stress: Not on file  Social Connections: Not on file  Intimate Partner Violence: Not on file    Outpatient Medications Prior to Visit  Medication Sig Dispense Refill   aspirin 81 MG chewable tablet Chew 81 mg by mouth daily.     atorvastatin (LIPITOR) 10 MG tablet Take 0.5 tablets (5 mg total) by mouth at bedtime. 45 tablet 0   baclofen (LIORESAL) 10 MG tablet Take 1 tablet (10 mg total) by mouth 2 (two) times daily as needed for muscle spasms. 30 tablet 0   calcium carbonate (OS-CAL) 600 MG TABS tablet Take 1 tablet by mouth daily.     cetirizine (ZYRTEC) 5 MG tablet Take 1 tablet by mouth daily.     FLUoxetine (PROZAC) 20 MG capsule TAKE 1 CAPSULE DAILY 90 capsule 1   glucose blood (FREESTYLE LITE) test strip CHECK  FASTING BLOOD SUGAR AND 2 HOURS AFTER A MEAL 200 each 3   levothyroxine (LEVOXYL) 112 MCG tablet Take 1 tablet (112 mcg total) by mouth daily before breakfast. **NEEDS APT FOR REFILLS** 90 tablet 0   lisinopril-hydrochlorothiazide (ZESTORETIC) 20-25 MG tablet TAKE 1 TABLET DAILY 90 tablet 0   metFORMIN (GLUCOPHAGE) 1000 MG tablet Take 1 tablet (1,000 mg total) by mouth 2 (two) times daily with a meal. **NEEDS APT FOR REFILLS** 180 tablet 0   Multiple Vitamins-Minerals (ONE-A-DAY WOMENS 50 PLUS PO) Take 1 tablet daily by mouth.     Omega-3 Fatty Acids (FISH OIL) 1200 MG CAPS Take 4-5 tabs daily     Vitamin D, Ergocalciferol, (DRISDOL) 1.25 MG (50000 UNIT) CAPS capsule TAKE 1 CAPSULE EVERY 7 DAYS 12 capsule 3   No facility-administered medications prior to visit.    No Known Allergies  Review of Systems Review of Systems:  A fourteen system review of systems was performed and found to be positive as per HPI.  Objective:    Physical Exam General:  Well Developed, well nourished, appropriate for stated age.  Neuro:  Alert and oriented,  extra-ocular muscles intact  HEENT:  Normocephalic, atraumatic, tenderness of frontal and maxillary sinus, PERRL, erythematous and bulging left TM, normal TM of right ear, boggy turbinates, erythema of posterior oropharynx w/o exudates, neck supple, + adenopathy (submandibular gland b/l and postauricular of left ear) Skin:  no gross rash, warm, pink. Cardiac:  RRR, S1 S2 Respiratory:  CTA w/o wheezing, crackles or rales, slight dec breath sounds at lung bases. Not using accessory muscles, speaking in full sentences- unlabored. Vascular:  Ext warm, no cyanosis apprec.; cap RF less 2 sec. Psych:  No HI/SI, judgement and insight good, Euthymic mood. Full Affect.  BP 101/69   Pulse 88   Temp (!) 96 F (35.6 C)   Ht 5\' 10"  (1.778 m)   Wt 251 lb (113.9 kg)   SpO2 96%   BMI 36.01 kg/m  Wt Readings from Last 3 Encounters:  10/25/21 251 lb (113.9 kg)   06/30/21 265 lb (120.2 kg)  05/05/21 254 lb 12.8 oz (115.6 kg)    Health Maintenance Due  Topic Date Due   Hepatitis C Screening  Never done   Zoster Vaccines- Shingrix (1 of 2) Never done   Pneumococcal Vaccine 42-42 Years old (2 - PCV) 10/18/2017   COVID-19 Vaccine (3 - Pfizer risk series) 04/21/2020   OPHTHALMOLOGY EXAM  09/17/2020   INFLUENZA VACCINE  07/05/2021   PAP SMEAR-Modifier  10/03/2021    There are no preventive care reminders to display for this patient.   Lab Results  Component Value Date   TSH 1.490 12/07/2020   Lab Results  Component Value Date   WBC 18.5 05/05/2021   HGB 12.7 05/05/2021   HCT 38 05/05/2021   MCV 84 12/07/2020   PLT 228 05/05/2021   Lab Results  Component Value Date   NA 137 12/07/2020   K 4.2 12/07/2020   CO2 26 12/07/2020   GLUCOSE 127 (H) 12/07/2020   BUN 15 12/07/2020   CREATININE 0.53 (L) 12/07/2020   BILITOT 0.3 12/07/2020   ALKPHOS 69 12/07/2020   AST 18 12/07/2020   ALT 26 12/07/2020   PROT 6.4 12/07/2020   ALBUMIN 4.1 12/07/2020   CALCIUM 10.3 (H) 12/07/2020   Lab Results  Component Value Date   CHOL 144 12/07/2020   Lab Results  Component Value Date   HDL 60 12/07/2020   Lab Results  Component Value Date   LDLCALC 59 12/07/2020   Lab Results  Component Value Date   TRIG 149 12/07/2020   Lab Results  Component Value Date   CHOLHDL 2.4 12/07/2020   Lab Results  Component Value Date   HGBA1C 6.1 (A) 06/30/2021       Assessment & Plan:   Problem List Items Addressed This Visit   None Visit Diagnoses     Acute left otitis media    -  Primary   Relevant Medications   amoxicillin-clavulanate (AUGMENTIN) 875-125 MG tablet   Acute non-recurrent sinusitis of other sinus       Relevant Medications   amoxicillin-clavulanate (AUGMENTIN) 875-125 MG tablet   benzonatate (TESSALON) 200 MG capsule   Acute cough       Relevant Medications   benzonatate (TESSALON) 200 MG capsule      Discussed with  patient has signs of AOM of left ear on exam and has s/sx consistent with sinusitis so recommend starting oral antibiotic therapy. Patient reports no med allergy to penicillins, will start Augmentin 875-125 mg PO BID x 10 days. Recommend to continue home supportive care with Mucinex, cough lozenges, humidifier and take tessalon [place] as needed for cough. If symptoms fail to improve or worsen will consider corticosteroid therapy and obtaining chest x-ray.   Meds ordered this encounter  Medications   amoxicillin-clavulanate (AUGMENTIN) 875-125 MG tablet    Sig: Take 1 tablet by mouth 2 (two) times daily.    Dispense:  20 tablet    Refill:  0    Order Specific Question:   Supervising Provider    Answer:   Beatrice Lecher D [[id number]]   benzonatate (TESSALON) 200 MG capsule    Sig: Take 1 capsule (200 mg total) by mouth 3 (three) times daily as needed for cough.    Dispense:  20 capsule    Refill:  0    Order Specific Question:   Supervising Provider    Answer:   Beatrice Lecher D [[id number]]     Lorrene Reid, PA-C

## 2021-10-26 ENCOUNTER — Other Ambulatory Visit: Payer: Self-pay | Admitting: Physician Assistant

## 2021-10-26 DIAGNOSIS — B379 Candidiasis, unspecified: Secondary | ICD-10-CM

## 2021-10-26 MED ORDER — FLUCONAZOLE 100 MG PO TABS: 100.0000 mg | ORAL_TABLET | Freq: Once | ORAL | 0 refills | Status: AC

## 2021-10-27 ENCOUNTER — Other Ambulatory Visit: Payer: Self-pay | Admitting: Physician Assistant

## 2021-10-27 DIAGNOSIS — E038 Other specified hypothyroidism: Secondary | ICD-10-CM

## 2021-10-27 DIAGNOSIS — E1169 Type 2 diabetes mellitus with other specified complication: Secondary | ICD-10-CM

## 2021-11-08 ENCOUNTER — Encounter: Payer: Self-pay | Admitting: Physician Assistant

## 2021-11-11 ENCOUNTER — Other Ambulatory Visit: Payer: Self-pay | Admitting: Physician Assistant

## 2021-11-11 DIAGNOSIS — E119 Type 2 diabetes mellitus without complications: Secondary | ICD-10-CM

## 2021-11-11 DIAGNOSIS — E038 Other specified hypothyroidism: Secondary | ICD-10-CM

## 2021-11-11 DIAGNOSIS — E1169 Type 2 diabetes mellitus with other specified complication: Secondary | ICD-10-CM

## 2021-11-16 NOTE — Telephone Encounter (Signed)
Rachel Simmons contacted the patient and scheduled the patient for this Thursday.

## 2021-11-17 ENCOUNTER — Other Ambulatory Visit: Payer: Self-pay

## 2021-11-17 ENCOUNTER — Encounter: Payer: Self-pay | Admitting: Nurse Practitioner

## 2021-11-17 ENCOUNTER — Ambulatory Visit (INDEPENDENT_AMBULATORY_CARE_PROVIDER_SITE_OTHER): Admitting: Nurse Practitioner

## 2021-11-17 VITALS — BP 99/65 | HR 72 | Temp 98.0°F | Ht 70.0 in | Wt 255.2 lb

## 2021-11-17 DIAGNOSIS — T3695XA Adverse effect of unspecified systemic antibiotic, initial encounter: Secondary | ICD-10-CM | POA: Insufficient documentation

## 2021-11-17 DIAGNOSIS — B379 Candidiasis, unspecified: Secondary | ICD-10-CM | POA: Diagnosis not present

## 2021-11-17 DIAGNOSIS — J0141 Acute recurrent pansinusitis: Secondary | ICD-10-CM | POA: Diagnosis not present

## 2021-11-17 DIAGNOSIS — R059 Cough, unspecified: Secondary | ICD-10-CM | POA: Insufficient documentation

## 2021-11-17 LAB — POCT INFLUENZA A/B
Influenza A, POC: NEGATIVE
Influenza B, POC: NEGATIVE

## 2021-11-17 LAB — POCT RAPID STREP A (OFFICE): Rapid Strep A Screen: NEGATIVE

## 2021-11-17 MED ORDER — AZITHROMYCIN 250 MG PO TABS: ORAL_TABLET | ORAL | 0 refills | Status: DC

## 2021-11-17 MED ORDER — FLUCONAZOLE 150 MG PO TABS: ORAL_TABLET | ORAL | 0 refills | Status: DC

## 2021-11-17 NOTE — Progress Notes (Signed)
Acute Office Visit  Subjective:    Patient ID: Rachel Simmons, female    DOB: [birth year], 55 y.o.   MRN: [id number]  Chief Complaint  Patient presents with   Nasal Congestion    The patient was seen and treated approximately three weeks ago. She was treated for sinusitis and ear infection. Completed full round of antibiotics. Felt better for a time. States that about four days ago, the symptoms returned with "vengeance." She did take home test for COVID 19 which was negative.   URI  This is a recurrent problem. The current episode started in the past 7 days. The problem has been gradually worsening. There has been no fever. Associated symptoms include congestion, coughing, ear pain, a plugged ear sensation, rhinorrhea, a sore throat and swollen glands. Pertinent negatives include no abdominal pain, chest pain, diarrhea, dysuria, headaches, joint pain, joint swelling, nausea, rash, sinus pain, sneezing, vomiting or wheezing. She has tried decongestant (DayQuil/NyQuil, mucinex, cough drops, gargling with salt water, drinking hot tea) for the symptoms.    Past Medical History:  Diagnosis Date   Back pain    Chronic lymphoblastic leukemia    Diabetes mellitus without complication (HCC)    Edema, lower extremity    Family history of breast cancer    Family history of colon cancer    Family history of polyps in the colon    Hypertension    Joint pain    Prediabetes    Thyroid disease     Past Surgical History:  Procedure Laterality Date   ABDOMINAL HYSTERECTOMY  2006   reports ovaries and cervix are intact   TONSILLECTOMY      Family History  Problem Relation Age of Onset   Breast cancer Mother        52s; deceased 42   Colon polyps Mother        Dx 38s; #/type unknown   Diabetes Mother    Heart disease Mother    Thyroid disease Mother    Sleep apnea Mother    Obesity Mother    Cancer Father        lung cancer; smoker; deceased 48   Alcoholism Father    Breast  cancer Sister 74       triple negative; currently 76   Cancer Maternal Aunt        lung cancer; smoker; currently 16   Colon cancer Maternal Uncle        Dx 54s; currently 34   Breast cancer Paternal Aunt        Dx 16s; currently 108s   Cancer Paternal Uncle        liver; heavy drinker   Cancer Maternal Grandfather        unk. primary; deceased 23s   Cancer Paternal Grandmother        Dx 70s; unknown primary   Breast cancer Sister 62       currently 59   Uterine cancer Sister 78       currently 42   Cancer Paternal Uncle        lung; heavy smoker   Breast cancer Cousin        mat first cousin through aunt; poss. breast ca; had mastectomy; currently 50   Ovarian cancer Other        mat grandmother's sister   Cancer Other        stomach cancer; mat grandmother's sister   Colon polyps Brother  age, #, type unknown    Social History   Socioeconomic History   Marital status: Widowed    Spouse name: Not on file   Number of children: Not on file   Years of education: Not on file   Highest education level: Not on file  Occupational History   Occupation: S[employer]  Tobacco Use   Smoking status: Never   Smokeless tobacco: Never  Vaping Use   Vaping Use: Never used  Substance and Sexual Activity   Alcohol use: No   Drug use: No   Sexual activity: Yes    Birth control/protection: None  Other Topics Concern   Not on file  Social History Narrative   Not on file   Social Determinants of Health   Financial Resource Strain: Not on file  Food Insecurity: Not on file  Transportation Needs: Not on file  Physical Activity: Not on file  Stress: Not on file  Social Connections: Not on file  Intimate Partner Violence: Not on file    Outpatient Medications Prior to Visit  Medication Sig Dispense Refill   amoxicillin-clavulanate (AUGMENTIN) 875-125 MG tablet Take 1 tablet by mouth 2 (two) times daily. 20 tablet 0   aspirin 81 MG chewable tablet Chew 81 mg by mouth  daily.     atorvastatin (LIPITOR) 10 MG tablet Take 0.5 tablets (5 mg total) by mouth at bedtime. 45 tablet 0   baclofen (LIORESAL) 10 MG tablet Take 1 tablet (10 mg total) by mouth 2 (two) times daily as needed for muscle spasms. 30 tablet 0   benzonatate (TESSALON) 200 MG capsule Take 1 capsule (200 mg total) by mouth 3 (three) times daily as needed for cough. 20 capsule 0   calcium carbonate (OS-CAL) 600 MG TABS tablet Take 1 tablet by mouth daily.     cetirizine (ZYRTEC) 5 MG tablet Take 1 tablet by mouth daily.     FLUoxetine (PROZAC) 20 MG capsule TAKE 1 CAPSULE DAILY 90 capsule 1   glucose blood (FREESTYLE LITE) test strip USE TO CHECK FASTING BLOOD SUGAR AND 2 HOURS AFTER A MEAL 200 strip 3   levothyroxine (SYNTHROID) 112 MCG tablet TAKE 1 TABLET DAILY BEFORE BREAKFAST (NEED APPOINTMENT FOR REFILLS) 90 tablet 0   lisinopril-hydrochlorothiazide (ZESTORETIC) 20-25 MG tablet TAKE 1 TABLET DAILY 90 tablet 0   metFORMIN (GLUCOPHAGE) 1000 MG tablet TAKE 1 TABLET TWICE A DAY WITH MEALS (NEED APPOINTMENT FOR REFILLS) 180 tablet 0   Multiple Vitamins-Minerals (ONE-A-DAY WOMENS 50 PLUS PO) Take 1 tablet daily by mouth.     Omega-3 Fatty Acids (FISH OIL) 1200 MG CAPS Take 4-5 tabs daily     Vitamin D, Ergocalciferol, (DRISDOL) 1.25 MG (50000 UNIT) CAPS capsule TAKE 1 CAPSULE EVERY 7 DAYS 12 capsule 3   No facility-administered medications prior to visit.    No Known Allergies  Review of Systems  Constitutional:  Positive for fatigue. Negative for activity change, appetite change, chills and fever.  HENT:  Positive for congestion, ear pain, postnasal drip, rhinorrhea and sore throat. Negative for sinus pressure, sinus pain and sneezing.   Eyes: Negative.   Respiratory:  Positive for cough. Negative for chest tightness, shortness of breath and wheezing.   Cardiovascular:  Negative for chest pain and palpitations.  Gastrointestinal:  Negative for abdominal pain, constipation, diarrhea, nausea and  vomiting.  Endocrine: Negative for cold intolerance, heat intolerance, polydipsia and polyuria.  Genitourinary:  Negative for dyspareunia, dysuria, flank pain, frequency and urgency.  Musculoskeletal:  Negative for  arthralgias, back pain, joint pain and myalgias.  Skin:  Negative for rash.  Allergic/Immunologic: Negative for environmental allergies.  Neurological:  Negative for dizziness, weakness and headaches.  Hematological:  Negative for adenopathy.  Psychiatric/Behavioral:  The patient is not nervous/anxious.       Objective:    Physical Exam Vitals and nursing note reviewed.  Constitutional:      Appearance: Normal appearance. She is well-developed. She is ill-appearing.  HENT:     Head: Normocephalic and atraumatic.     Right Ear: Hearing, ear canal and external ear normal. Tympanic membrane is bulging.     Left Ear: Hearing, ear canal and external ear normal. Tenderness present. Tympanic membrane is erythematous and bulging.     Nose: Congestion present.     Right Sinus: No maxillary sinus tenderness or frontal sinus tenderness.     Left Sinus: No maxillary sinus tenderness or frontal sinus tenderness.     Mouth/Throat:     Pharynx: Posterior oropharyngeal erythema and uvula swelling present.  Eyes:     Pupils: Pupils are equal, round, and reactive to light.  Cardiovascular:     Rate and Rhythm: Normal rate and regular rhythm.     Pulses: Normal pulses.     Heart sounds: Normal heart sounds.  Pulmonary:     Effort: Pulmonary effort is normal.     Breath sounds: Normal breath sounds.  Abdominal:     Palpations: Abdomen is soft.  Musculoskeletal:        General: Normal range of motion.     Cervical back: Normal range of motion and neck supple.  Lymphadenopathy:     Cervical: Cervical adenopathy present.  Skin:    General: Skin is warm and dry.     Capillary Refill: Capillary refill takes less than 2 seconds.  Neurological:     General: No focal deficit present.      Mental Status: She is alert and oriented to person, place, and time.  Psychiatric:        Mood and Affect: Mood normal.        Behavior: Behavior normal.        Thought Content: Thought content normal.        Judgment: Judgment normal.   Today's Vitals   11/17/21 1627  BP: 99/65  Pulse: 72  Temp: 98 F (36.7 C)  SpO2: 98%  Weight: 255 lb 3.2 oz (115.8 kg)  Height: 5\' 10"  (1.778 m)   Body mass index is 36.62 kg/m.   Wt Readings from Last 3 Encounters:  11/17/21 255 lb 3.2 oz (115.8 kg)  10/25/21 251 lb (113.9 kg)  06/30/21 265 lb (120.2 kg)    Health Maintenance Due  Topic Date Due   Hepatitis C Screening  Never done   Zoster Vaccines- Shingrix (1 of 2) Never done   Pneumococcal Vaccine 55-24 Years old (2 - PCV) 10/18/2017   COVID-19 Vaccine (3 - Pfizer risk series) 04/21/2020   OPHTHALMOLOGY EXAM  09/17/2020   INFLUENZA VACCINE  07/05/2021   PAP SMEAR-Modifier  10/03/2021    There are no preventive care reminders to display for this patient.   Lab Results  Component Value Date   TSH 1.490 12/07/2020   Lab Results  Component Value Date   WBC 18.5 05/05/2021   HGB 12.7 05/05/2021   HCT 38 05/05/2021   MCV 84 12/07/2020   PLT 228 05/05/2021   Lab Results  Component Value Date   NA 137 12/07/2020  K 4.2 12/07/2020   CO2 26 12/07/2020   GLUCOSE 127 (H) 12/07/2020   BUN 15 12/07/2020   CREATININE 0.53 (L) 12/07/2020   BILITOT 0.3 12/07/2020   ALKPHOS 69 12/07/2020   AST 18 12/07/2020   ALT 26 12/07/2020   PROT 6.4 12/07/2020   ALBUMIN 4.1 12/07/2020   CALCIUM 10.3 (H) 12/07/2020   Lab Results  Component Value Date   CHOL 144 12/07/2020   Lab Results  Component Value Date   HDL 60 12/07/2020   Lab Results  Component Value Date   LDLCALC 59 12/07/2020   Lab Results  Component Value Date   TRIG 149 12/07/2020   Lab Results  Component Value Date   CHOLHDL 2.4 12/07/2020   Lab Results  Component Value Date   HGBA1C 6.1 (A) 06/30/2021        Assessment & Plan:  1. Acute recurrent pansinusitis Patient negative for flu and strep today. Start z-pack. Take as directed for 5 days. Rest and increase fluids. Continue using OTC medication to control symptoms.   - azithromycin (ZITHROMAX) 250 MG tablet; z-pack - take as directed for 5 days  Dispense: 6 tablet; Refill: 0  2. Cough, unspecified type Flu and strep both negative today. Treat for recurrent sinusitis. Continue symptom management as needed and as indicated.  - POCT Influenza A/B - POCT rapid strep A  3. Antibiotic-induced yeast infection Diflucan 150mg  may be taken at onset of yeast infection. May repeat dose in three days for persistent symptoms.  - fluconazole (DIFLUCAN) 150 MG tablet; Take 1 tablet po once. May repeat dose in 3 days as needed for persistent symptoms.  Dispense: 3 tablet; Refill: 0   Problem List Items Addressed This Visit       Respiratory   Acute recurrent pansinusitis - Primary   Relevant Medications   azithromycin (ZITHROMAX) 250 MG tablet   fluconazole (DIFLUCAN) 150 MG tablet     Other   Cough   Relevant Orders   POCT Influenza A/B (Completed)   POCT rapid strep A (Completed)   Antibiotic-induced yeast infection   Relevant Medications   azithromycin (ZITHROMAX) 250 MG tablet   fluconazole (DIFLUCAN) 150 MG tablet     Meds ordered this encounter  Medications   azithromycin (ZITHROMAX) 250 MG tablet    Sig: z-pack - take as directed for 5 days    Dispense:  6 tablet    Refill:  0    Order Specific Question:   Supervising Provider    Answer:   Beatrice Lecher D [[id number]]   fluconazole (DIFLUCAN) 150 MG tablet    Sig: Take 1 tablet po once. May repeat dose in 3 days as needed for persistent symptoms.    Dispense:  3 tablet    Refill:  0    Order Specific Question:   Supervising Provider    Answer:   Beatrice Lecher D [[id number]]     Ronnell Freshwater, NP

## 2021-11-22 ENCOUNTER — Other Ambulatory Visit: Payer: Self-pay | Admitting: Physician Assistant

## 2021-11-22 DIAGNOSIS — E559 Vitamin D deficiency, unspecified: Secondary | ICD-10-CM

## 2021-11-29 ENCOUNTER — Other Ambulatory Visit: Payer: Self-pay | Admitting: Physician Assistant

## 2021-11-29 DIAGNOSIS — I152 Hypertension secondary to endocrine disorders: Secondary | ICD-10-CM

## 2021-12-30 ENCOUNTER — Other Ambulatory Visit: Payer: Self-pay | Admitting: Physician Assistant

## 2021-12-30 DIAGNOSIS — F4323 Adjustment disorder with mixed anxiety and depressed mood: Secondary | ICD-10-CM

## 2022-01-12 ENCOUNTER — Other Ambulatory Visit: Payer: Self-pay | Admitting: Physician Assistant

## 2022-01-12 DIAGNOSIS — F4323 Adjustment disorder with mixed anxiety and depressed mood: Secondary | ICD-10-CM

## 2022-02-07 ENCOUNTER — Other Ambulatory Visit: Payer: Self-pay | Admitting: Physician Assistant

## 2022-02-07 DIAGNOSIS — E1159 Type 2 diabetes mellitus with other circulatory complications: Secondary | ICD-10-CM

## 2022-02-07 DIAGNOSIS — F4323 Adjustment disorder with mixed anxiety and depressed mood: Secondary | ICD-10-CM

## 2022-02-07 DIAGNOSIS — I152 Hypertension secondary to endocrine disorders: Secondary | ICD-10-CM

## 2022-02-09 ENCOUNTER — Other Ambulatory Visit: Payer: Self-pay | Admitting: Physician Assistant

## 2022-02-09 DIAGNOSIS — E038 Other specified hypothyroidism: Secondary | ICD-10-CM

## 2022-02-09 DIAGNOSIS — E1169 Type 2 diabetes mellitus with other specified complication: Secondary | ICD-10-CM

## 2022-02-18 ENCOUNTER — Encounter: Payer: Self-pay | Admitting: Physician Assistant

## 2022-02-18 ENCOUNTER — Other Ambulatory Visit: Payer: Self-pay

## 2022-02-18 ENCOUNTER — Ambulatory Visit (INDEPENDENT_AMBULATORY_CARE_PROVIDER_SITE_OTHER): Admitting: Physician Assistant

## 2022-02-18 VITALS — BP 113/75 | HR 70 | Temp 97.5°F | Ht 69.0 in | Wt 258.0 lb

## 2022-02-18 DIAGNOSIS — E559 Vitamin D deficiency, unspecified: Secondary | ICD-10-CM

## 2022-02-18 DIAGNOSIS — Z1329 Encounter for screening for other suspected endocrine disorder: Secondary | ICD-10-CM

## 2022-02-18 DIAGNOSIS — E1159 Type 2 diabetes mellitus with other circulatory complications: Secondary | ICD-10-CM | POA: Diagnosis not present

## 2022-02-18 DIAGNOSIS — E785 Hyperlipidemia, unspecified: Secondary | ICD-10-CM

## 2022-02-18 DIAGNOSIS — E1169 Type 2 diabetes mellitus with other specified complication: Secondary | ICD-10-CM

## 2022-02-18 DIAGNOSIS — I152 Hypertension secondary to endocrine disorders: Secondary | ICD-10-CM

## 2022-02-18 LAB — POCT GLYCOSYLATED HEMOGLOBIN (HGB A1C): Hemoglobin A1C: 6 % — AB (ref 4.0–5.6)

## 2022-02-18 MED ORDER — FREESTYLE LITE TEST VI STRP: ORAL_STRIP | 3 refills | Status: DC

## 2022-02-18 NOTE — Progress Notes (Signed)
?Established patient visit ? ? ?Patient: Rachel Simmons   DOB: 11-07-1966   56 y.o. Female  MRN: [id number] ?Visit Date: 02/18/2022 ? ?Chief Complaint  ?Patient presents with  ? Follow-up  ? Diabetes  ? Hyperlipidemia  ? Hypertension  ? ?Subjective  ?  ?HPI  ?Patient presents for follow-up on diabetes mellitus, hypertension and hyperlipidemia. ? ?Diabetes mellitus: Pt denies increased urination or thirst. Pt reports medication compliance. No hypoglycemic events. Checking glucose at home. Sugars 2 hrs after eating have been 150-160. States started a new job few months ago and her eating schedule has been out of track.  ? ?HTN: Pt denies chest pain, palpitations, dizziness or headache. Does have lower extremity swelling, mostly left side. Taking medication as directed without side effects. Pt follows a low salt diet. ? ?HLD: Pt taking medication as directed without issues. Patient reports has been more active with doing cardio at her work gym.  ? ?Medications: ?Outpatient Medications Prior to Visit  ?Medication Sig  ? aspirin 81 MG chewable tablet Chew 81 mg by mouth daily.  ? atorvastatin (LIPITOR) 10 MG tablet Take 0.5 tablets (5 mg total) by mouth at bedtime.  ? calcium carbonate (OS-CAL) 600 MG TABS tablet Take 1 tablet by mouth daily.  ? cetirizine (ZYRTEC) 5 MG tablet Take 1 tablet by mouth daily.  ? FLUoxetine (PROZAC) 20 MG capsule TAKE 1 CAPSULE DAILY (PLEASE CONTACT OUR OFFICE TO SCHEDULE A FOLLOW UP FOR FUTURE MEDICATION REFILLS)  ? levothyroxine (SYNTHROID) 112 MCG tablet TAKE 1 TABLET DAILY BEFORE BREAKFAST (NEED APPOINTMENT FOR REFILLS)  ? lisinopril-hydrochlorothiazide (ZESTORETIC) 20-25 MG tablet TAKE 1 TABLET DAILY (NEED APPOINTMENT)  ? metFORMIN (GLUCOPHAGE) 1000 MG tablet TAKE 1 TABLET TWICE A DAY WITH MEALS (NEED APPOINTMENT FOR REFILLS)  ? Multiple Vitamins-Minerals (ONE-A-DAY WOMENS 50 PLUS PO) Take 1 tablet daily by mouth.  ? Omega-3 Fatty Acids (FISH OIL) 1200 MG CAPS Take 4-5 tabs daily  ?  Vitamin D, Ergocalciferol, (DRISDOL) 1.25 MG (50000 UNIT) CAPS capsule TAKE 1 CAPSULE EVERY 7 DAYS  ? [DISCONTINUED] amoxicillin-clavulanate (AUGMENTIN) 875-125 MG tablet Take 1 tablet by mouth 2 (two) times daily.  ? [DISCONTINUED] azithromycin (ZITHROMAX) 250 MG tablet z-pack - take as directed for 5 days  ? [DISCONTINUED] baclofen (LIORESAL) 10 MG tablet Take 1 tablet (10 mg total) by mouth 2 (two) times daily as needed for muscle spasms.  ? [DISCONTINUED] benzonatate (TESSALON) 200 MG capsule Take 1 capsule (200 mg total) by mouth 3 (three) times daily as needed for cough.  ? [DISCONTINUED] fluconazole (DIFLUCAN) 150 MG tablet Take 1 tablet po once. May repeat dose in 3 days as needed for persistent symptoms.  ? [DISCONTINUED] glucose blood (FREESTYLE LITE) test strip USE TO CHECK FASTING BLOOD SUGAR AND 2 HOURS AFTER A MEAL  ? ?No facility-administered medications prior to visit.  ? ? ?Review of Systems ?Review of Systems:  ?A fourteen system review of systems was performed and found to be positive as per HPI. ? ? ? ?  Objective  ?  ?BP 113/75   Pulse 70   Temp (!) 97.5 ?F (36.4 ?C)   Ht 5' 9" (1.753 m)   Wt 258 lb (117 kg)   SpO2 98%   BMI 38.10 kg/m?  ?BP Readings from Last 3 Encounters:  ?02/18/22 113/75  ?11/17/21 99/65  ?10/25/21 101/69  ? ?Wt Readings from Last 3 Encounters:  ?02/18/22 258 lb (117 kg)  ?11/17/21 255 lb 3.2 oz (115.8 kg)  ?10/25/21 251 lb (113.9  kg)  ? ? ?Physical Exam  ?General:  Well Developed, well nourished, appropriate for stated age.  ?Neuro:  Alert and oriented,  extra-ocular muscles intact  ?HEENT:  Normocephalic, atraumatic, neck supple  ?Skin:  no gross rash, warm, pink. ?Cardiac:  RRR, S1 S2 ?Respiratory: CTA B/L  ?Vascular:  Ext warm, no cyanosis apprec.; cap RF less 2 sec. No pitting edema. ?Psych:  No HI/SI, judgement and insight good, Euthymic mood. Full Affect. ? ? ?Results for orders placed or performed in visit on 02/18/22  ?POCT glycosylated hemoglobin (Hb A1C)   ?Result Value Ref Range  ? Hemoglobin A1C 6.0 (A) 4.0 - 5.6 %  ? HbA1c POC (<> result, manual entry)    ? HbA1c, POC (prediabetic range)    ? HbA1c, POC (controlled diabetic range)    ? ?Depression screen [facility] 2/9 02/18/2022 11/17/2021 10/25/2021 06/30/2021 12/24/2020  ?Decreased Interest 0 0 0 0 0  ?Down, Depressed, Hopeless 0 0 0 0 0  ?PHQ - 2 Score 0 0 0 0 0  ?Altered sleeping 0 - 0 0 0  ?Tired, decreased energy 0 0 0 0 0  ?Change in appetite 0 0 0 0 0  ?Feeling bad or failure about yourself  0 0 0 0 0  ?Trouble concentrating 0 0 0 0 0  ?Moving slowly or fidgety/restless 0 0 0 0 0  ?Suicidal thoughts 0 0 0 0 0  ?PHQ-9 Score 0 - 0 0 0  ?Difficult doing work/chores Not difficult at all - Not difficult at all - -  ?Some recent data might be hidden  ? ?GAD 7 : Generalized Anxiety Score 02/18/2022 11/17/2021 10/25/2021 06/30/2021  ?Nervous, Anxious, on Edge 0 0 0 0  ?Control/stop worrying 0 0 0 0  ?Worry too much - different things 0 0 0 0  ?Trouble relaxing 0 0 0 0  ?Restless 0 0 0 0  ?Easily annoyed or irritable 0 0 0 0  ?Afraid - awful might happen 0 0 0 0  ?Total GAD 7 Score 0 0 0 0  ?Anxiety Difficulty Not difficult at all - Not difficult at all -  ? ? ? ? Assessment & Plan  ?  ? ? ?Problem List Items Addressed This Visit   ? ?  ? Cardiovascular and Mediastinum  ? Hypertension associated with diabetes ([place]) (Chronic)  ?  -Controlled. ?-Continue current medication regimen. ?-Will collect CMP for medication monitoring. ?-Will continue to monitor. ?  ?  ? Relevant Orders  ? CBC w/Diff  ? Comp Met (CMET)  ?  ? Endocrine  ? Hyperlipidemia associated with type 2 diabetes mellitus (HCC) (Chronic)  ?  -Last lipid panel: HDL 60, LDL 59 ?-Continue current medication regimen. ?-Continue heart healthy diet and exercise. ?-Will repeat lipid panel and hepatic function today. ?  ?  ? Relevant Orders  ? Lipid Profile  ? Diabetes mellitus ([place]) - Primary  ?  -Stable. A1c today has improved from 6.1 to 6.0. Discussed diabetic diet.  Continue ambulatory glucose monitoring. ?-Will continue to monitor. ?  ?  ? Relevant Medications  ? glucose blood (FREESTYLE LITE) test strip  ? Other Relevant Orders  ? POCT glycosylated hemoglobin (Hb A1C) (Completed)  ? CBC w/Diff  ? Comp Met (CMET)  ?  ? Other  ? Vitamin D deficiency  ?  -Last Vit D 37.8, will repeat Vit D today. Patient on Vit D 50,000 units once a week. Pending results will make treatment adjustments if indicated. ?  ?  ?  Relevant Orders  ? Vitamin D (25 hydroxy)  ? ?Other Visit Diagnoses   ? ? Screening for thyroid disorder      ? Relevant Orders  ? TSH  ? ?  ? ? ?Return in about 4 months (around 06/20/2022) for CPE and FBW.  ?   ? ? ? ?Lorrene Reid, PA-C  ?[facility] ?[phone] (phone) ?[phone] (fax) ? ?[facility] ?

## 2022-02-18 NOTE — Assessment & Plan Note (Signed)
-  Last Vit D 37.8, will repeat Vit D today. Patient on Vit D 50,000 units once a week. Pending results will make treatment adjustments if indicated. ?

## 2022-02-18 NOTE — Assessment & Plan Note (Signed)
-  Stable. A1c today has improved from 6.1 to 6.0. Discussed diabetic diet. Continue ambulatory glucose monitoring. ?-Will continue to monitor. ?

## 2022-02-18 NOTE — Assessment & Plan Note (Signed)
>>  ASSESSMENT AND PLAN FOR DIABETES MELLITUS WRITTEN ON 02/18/2022  8:55 AM BY ABONZA, MARITZA, PA-C  -Stable. A1c today has improved from 6.1 to 6.0. Discussed diabetic diet. Continue ambulatory glucose monitoring. -Will continue to monitor.

## 2022-02-18 NOTE — Assessment & Plan Note (Signed)
-  Controlled. -Continue current medication regimen. -Will collect CMP for medication monitoring. -Will continue to monitor. 

## 2022-02-18 NOTE — Patient Instructions (Signed)
Arthritis Arthritis is a term that is commonly used to refer to joint pain or jointdisease. There are more than 100 types of arthritis. What are the causes? The most common cause of this condition is wear and tear of a joint. Other causes include: Gout. Inflammation of a joint. An infection of a joint. Sprains and other injuries near the joint. A reaction to medicines or drugs, or an allergic reaction. In some cases, the cause may not be known. What are the signs or symptoms? The main symptom of this condition is pain in the joint during movement. Other symptoms include: Redness, swelling, or stiffness at a joint. Warmth coming from the joint. Fever. Overall feeling of illness. How is this diagnosed? This condition may be diagnosed with a physical exam and tests, including: Blood tests. Urine tests. Imaging tests, such as X-rays, an MRI, or a CT scan. Sometimes, fluid is removed from a joint for testing. How is this treated? This condition may be treated with: Treatment of the cause, if it is known. Rest. Raising (elevating) the joint. Applying cold or hot packs to the joint. Medicines to improve symptoms and reduce inflammation. Injections of a steroid such as cortisone into the joint to help reduce pain and inflammation. Depending on the cause of your arthritis, you may need to make lifestyle changes to reduce stress on your joint. Changes may include: Exercising more. Losing weight. Follow these instructions at home: Medicines Take over-the-counter and prescription medicines only as told by your health care provider. Do not take aspirin to relieve pain if your health care provider thinks that gout may be causing your pain. Activity Rest your joint if told by your health care provider. Rest is important when your disease is active and your joint feels painful, swollen, or stiff. Avoid activities that make the pain worse. It is important to balance activity with  rest. Exercise your joint regularly with range-of-motion exercises as told by your health care provider. Try doing low-impact exercise, such as: Swimming. Water aerobics. Biking. Walking. Managing pain, stiffness, and swelling     If directed, put ice on the joint. Put ice in a plastic bag. Place a towel between your skin and the bag. Leave the ice on for 20 minutes, 2-3 times per day. If your joint is swollen, raise (elevate) it above the level of your heart if directed by your health care provider. If your joint feels stiff in the morning, try taking a warm shower. If directed, apply heat to the affected area as often as told by your health care provider. Use the heat source that your health care provider recommends, such as a moist heat pack or a heating pad. If you have diabetes, do not apply heat without permission from your health care provider. To apply heat: Place a towel between your skin and the heat source. Leave the heat on for 20-30 minutes. Remove the heat if your skin turns bright red. This is especially important if you are unable to feel pain, heat, or cold. You may have a greater risk of getting burned. General instructions Do not use any products that contain nicotine or tobacco, such as cigarettes, e-cigarettes, and chewing tobacco. If you need help quitting, ask your health care provider. Keep all follow-up visits as told by your health care provider. This is important. Contact a health care provider if: The pain gets worse. You have a fever. Get help right away if: You develop severe joint pain, swelling, or redness. Many joints   become painful and swollen. You develop severe back pain. You develop severe weakness in your leg. You cannot control your bladder or bowels. Summary Arthritis is a term that is commonly used to refer to joint pain or joint disease. There are more than 100 types of arthritis. The most common cause of this condition is wear and tear of a  joint. Other causes include gout, inflammation or infection of the joint, sprains, or allergies. Symptoms of this condition include redness, swelling, or stiffness of the joint. Other symptoms include warmth, fever, or feeling ill. This condition is treated with rest, elevation, medicines, and applying cold or hot packs. Follow your health care provider's instructions about medicines, activity, exercises, and other home care treatments. This information is not intended to replace advice given to you by your health care provider. Make sure you discuss any questions you have with your healthcare provider. Document Revised: 10/29/2018 Document Reviewed: 10/29/2018 Elsevier Patient Education  2022 [employer]  

## 2022-02-18 NOTE — Assessment & Plan Note (Signed)
-  Last lipid panel: HDL 60, LDL 59 ?-Continue current medication regimen. ?-Continue heart healthy diet and exercise. ?-Will repeat lipid panel and hepatic function today. ?

## 2022-02-19 LAB — COMPREHENSIVE METABOLIC PANEL
ALT: 21 IU/L (ref 0–32)
AST: 17 IU/L (ref 0–40)
Albumin/Globulin Ratio: 2.1 (ref 1.2–2.2)
Albumin: 4.4 g/dL (ref 3.8–4.9)
Alkaline Phosphatase: 70 IU/L (ref 44–121)
BUN/Creatinine Ratio: 33 — ABNORMAL HIGH (ref 9–23)
BUN: 17 mg/dL (ref 6–24)
Bilirubin Total: 0.2 mg/dL (ref 0.0–1.2)
CO2: 28 mmol/L (ref 20–29)
Calcium: 10.6 mg/dL — ABNORMAL HIGH (ref 8.7–10.2)
Chloride: 103 mmol/L (ref 96–106)
Creatinine, Ser: 0.52 mg/dL — ABNORMAL LOW (ref 0.57–1.00)
Globulin, Total: 2.1 g/dL (ref 1.5–4.5)
Glucose: 121 mg/dL — ABNORMAL HIGH (ref 70–99)
Potassium: 4.4 mmol/L (ref 3.5–5.2)
Sodium: 142 mmol/L (ref 134–144)
Total Protein: 6.5 g/dL (ref 6.0–8.5)
eGFR: 110 mL/min/{1.73_m2} (ref >59)

## 2022-02-19 LAB — CBC WITH DIFFERENTIAL/PLATELET
Basophils Absolute: 0.1 10*3/uL (ref 0.0–0.2)
Basos: 1 %
EOS (ABSOLUTE): 0.3 10*3/uL (ref 0.0–0.4)
Eos: 2 %
Hematocrit: 36.7 % (ref 34.0–46.6)
Hemoglobin: 11.9 g/dL (ref 11.1–15.9)
Immature Grans (Abs): 0 10*3/uL (ref 0.0–0.1)
Immature Granulocytes: 0 %
Lymphocytes Absolute: 13.7 10*3/uL — ABNORMAL HIGH (ref 0.7–3.1)
Lymphs: 71 %
MCH: 27 pg (ref 26.6–33.0)
MCHC: 32.4 g/dL (ref 31.5–35.7)
MCV: 83 fL (ref 79–97)
Monocytes Absolute: 0.6 10*3/uL (ref 0.1–0.9)
Monocytes: 3 %
Neutrophils Absolute: 4.4 10*3/uL (ref 1.4–7.0)
Neutrophils: 23 %
Platelets: 284 10*3/uL (ref 150–450)
RBC: 4.41 x10E6/uL (ref 3.77–5.28)
RDW: 13.1 % (ref 11.7–15.4)
WBC: 19.2 10*3/uL — ABNORMAL HIGH (ref 3.4–10.8)

## 2022-02-19 LAB — LIPID PANEL
Chol/HDL Ratio: 2 ratio (ref 0.0–4.4)
Cholesterol, Total: 120 mg/dL (ref 100–199)
HDL: 61 mg/dL (ref >39)
LDL Chol Calc (NIH): 36 mg/dL (ref 0–99)
Triglycerides: 135 mg/dL (ref 0–149)
VLDL Cholesterol Cal: 23 mg/dL (ref 5–40)

## 2022-02-19 LAB — TSH: TSH: 1.76 u[IU]/mL (ref 0.450–4.500)

## 2022-02-19 LAB — VITAMIN D 25 HYDROXY (VIT D DEFICIENCY, FRACTURES): Vit D, 25-Hydroxy: 48 ng/mL (ref 30.0–100.0)

## 2022-02-21 ENCOUNTER — Encounter: Payer: Self-pay | Admitting: Physician Assistant

## 2022-03-03 ENCOUNTER — Other Ambulatory Visit: Payer: Self-pay

## 2022-03-04 ENCOUNTER — Encounter: Payer: Self-pay | Admitting: Physician Assistant

## 2022-03-04 ENCOUNTER — Other Ambulatory Visit

## 2022-03-05 LAB — COMPREHENSIVE METABOLIC PANEL
ALT: 23 IU/L (ref 0–32)
AST: 20 IU/L (ref 0–40)
Albumin/Globulin Ratio: 1.9 (ref 1.2–2.2)
Albumin: 3.9 g/dL (ref 3.8–4.9)
Alkaline Phosphatase: 74 IU/L (ref 44–121)
BUN/Creatinine Ratio: 23 (ref 9–23)
BUN: 12 mg/dL (ref 6–24)
Bilirubin Total: <0.2 mg/dL (ref 0.0–1.2)
CO2: 26 mmol/L (ref 20–29)
Calcium: 10.6 mg/dL — ABNORMAL HIGH (ref 8.7–10.2)
Chloride: 107 mmol/L — ABNORMAL HIGH (ref 96–106)
Creatinine, Ser: 0.53 mg/dL — ABNORMAL LOW (ref 0.57–1.00)
Globulin, Total: 2.1 g/dL (ref 1.5–4.5)
Glucose: 113 mg/dL — ABNORMAL HIGH (ref 70–99)
Potassium: 5 mmol/L (ref 3.5–5.2)
Sodium: 145 mmol/L — ABNORMAL HIGH (ref 134–144)
Total Protein: 6 g/dL (ref 6.0–8.5)
eGFR: 110 mL/min/{1.73_m2} (ref >59)

## 2022-03-08 ENCOUNTER — Other Ambulatory Visit: Payer: Self-pay

## 2022-03-08 ENCOUNTER — Encounter: Payer: Self-pay | Admitting: Physician Assistant

## 2022-03-09 ENCOUNTER — Other Ambulatory Visit

## 2022-03-11 ENCOUNTER — Other Ambulatory Visit: Payer: Self-pay | Admitting: Physician Assistant

## 2022-03-11 ENCOUNTER — Encounter: Payer: Self-pay | Admitting: Physician Assistant

## 2022-03-11 DIAGNOSIS — E1159 Type 2 diabetes mellitus with other circulatory complications: Secondary | ICD-10-CM

## 2022-03-13 ENCOUNTER — Other Ambulatory Visit: Payer: Self-pay | Admitting: Physician Assistant

## 2022-03-13 DIAGNOSIS — E1159 Type 2 diabetes mellitus with other circulatory complications: Secondary | ICD-10-CM

## 2022-03-13 DIAGNOSIS — F4323 Adjustment disorder with mixed anxiety and depressed mood: Secondary | ICD-10-CM

## 2022-03-14 MED ORDER — LISINOPRIL-HYDROCHLOROTHIAZIDE 20-25 MG PO TABS: 1.0000 | ORAL_TABLET | Freq: Every day | ORAL | 1 refills | Status: DC

## 2022-03-22 ENCOUNTER — Telehealth: Payer: Self-pay | Admitting: Physician Assistant

## 2022-03-22 NOTE — Telephone Encounter (Signed)
Patient advised it was an old rx and she should disregard needing to make an appointment.  ?

## 2022-03-22 NOTE — Telephone Encounter (Signed)
Patient called and is asking why when she picked up her prozac it said on the bottle to make an appointment before future refills? She states she was just seen in office. Please advise. [phone] ?

## 2022-04-15 ENCOUNTER — Other Ambulatory Visit

## 2022-04-15 DIAGNOSIS — Z0000 Encounter for general adult medical examination without abnormal findings: Secondary | ICD-10-CM

## 2022-04-15 DIAGNOSIS — E1169 Type 2 diabetes mellitus with other specified complication: Secondary | ICD-10-CM

## 2022-04-15 DIAGNOSIS — E1159 Type 2 diabetes mellitus with other circulatory complications: Secondary | ICD-10-CM

## 2022-04-16 LAB — CBC WITH DIFFERENTIAL/PLATELET
Basophils Absolute: 0.1 x10E3/uL (ref 0.0–0.2)
Basos: 1 %
EOS (ABSOLUTE): 0.3 x10E3/uL (ref 0.0–0.4)
Eos: 2 %
Hematocrit: 36.3 % (ref 34.0–46.6)
Hemoglobin: 11.5 g/dL (ref 11.1–15.9)
Immature Grans (Abs): 0 x10E3/uL (ref 0.0–0.1)
Immature Granulocytes: 0 %
Lymphocytes Absolute: 11.2 x10E3/uL — ABNORMAL HIGH (ref 0.7–3.1)
Lymphs: 72 %
MCH: 26.7 pg (ref 26.6–33.0)
MCHC: 31.7 g/dL (ref 31.5–35.7)
MCV: 84 fL (ref 79–97)
Monocytes Absolute: 0.6 x10E3/uL (ref 0.1–0.9)
Monocytes: 4 %
Neutrophils Absolute: 3.3 x10E3/uL (ref 1.4–7.0)
Neutrophils: 21 %
Platelets: 270 x10E3/uL (ref 150–450)
RBC: 4.3 x10E6/uL (ref 3.77–5.28)
RDW: 14.1 % (ref 11.7–15.4)
WBC: 15.5 x10E3/uL — ABNORMAL HIGH (ref 3.4–10.8)

## 2022-04-16 LAB — COMPREHENSIVE METABOLIC PANEL WITH GFR
ALT: 20 IU/L (ref 0–32)
AST: 19 IU/L (ref 0–40)
Albumin/Globulin Ratio: 2.1 (ref 1.2–2.2)
Albumin: 4.1 g/dL (ref 3.8–4.9)
Alkaline Phosphatase: 78 IU/L (ref 44–121)
BUN/Creatinine Ratio: 26 — ABNORMAL HIGH (ref 9–23)
BUN: 14 mg/dL (ref 6–24)
Bilirubin Total: <0.2 mg/dL (ref 0.0–1.2)
CO2: 25 mmol/L (ref 20–29)
Calcium: 10 mg/dL (ref 8.7–10.2)
Chloride: 105 mmol/L (ref 96–106)
Creatinine, Ser: 0.53 mg/dL — ABNORMAL LOW (ref 0.57–1.00)
Globulin, Total: 2 g/dL (ref 1.5–4.5)
Glucose: 115 mg/dL — ABNORMAL HIGH (ref 70–99)
Potassium: 4.7 mmol/L (ref 3.5–5.2)
Sodium: 139 mmol/L (ref 134–144)
Total Protein: 6.1 g/dL (ref 6.0–8.5)
eGFR: 110 mL/min/1.73 (ref >59)

## 2022-04-16 LAB — LIPID PANEL
Chol/HDL Ratio: 3.3 ratio (ref 0.0–4.4)
Cholesterol, Total: 179 mg/dL (ref 100–199)
HDL: 55 mg/dL (ref >39)
LDL Chol Calc (NIH): 93 mg/dL (ref 0–99)
Triglycerides: 180 mg/dL — ABNORMAL HIGH (ref 0–149)
VLDL Cholesterol Cal: 31 mg/dL (ref 5–40)

## 2022-04-16 LAB — HEMOGLOBIN A1C
Est. average glucose Bld gHb Est-mCnc: 137 mg/dL
Hgb A1c MFr Bld: 6.4 % — ABNORMAL HIGH (ref 4.8–5.6)

## 2022-04-16 LAB — TSH: TSH: 1.86 u[IU]/mL (ref 0.450–4.500)

## 2022-04-18 ENCOUNTER — Encounter: Payer: Self-pay | Admitting: Physician Assistant

## 2022-04-19 ENCOUNTER — Other Ambulatory Visit: Payer: Self-pay | Admitting: Physician Assistant

## 2022-04-19 DIAGNOSIS — Z1231 Encounter for screening mammogram for malignant neoplasm of breast: Secondary | ICD-10-CM

## 2022-04-25 ENCOUNTER — Ambulatory Visit
Admission: RE | Admit: 2022-04-25 | Discharge: 2022-04-25 | Disposition: A | Discharge status: Home / Self Care | Source: Ambulatory Visit | Attending: Physician Assistant | Admitting: Physician Assistant

## 2022-04-25 DIAGNOSIS — Z1231 Encounter for screening mammogram for malignant neoplasm of breast: Secondary | ICD-10-CM

## 2022-04-27 ENCOUNTER — Encounter: Payer: Self-pay | Admitting: Physician Assistant

## 2022-04-27 ENCOUNTER — Other Ambulatory Visit: Payer: Self-pay | Admitting: Physician Assistant

## 2022-04-27 DIAGNOSIS — R928 Other abnormal and inconclusive findings on diagnostic imaging of breast: Secondary | ICD-10-CM

## 2022-05-05 ENCOUNTER — Ambulatory Visit: Admitting: Oncology

## 2022-05-05 ENCOUNTER — Other Ambulatory Visit

## 2022-05-06 ENCOUNTER — Ambulatory Visit: Admission: RE | Admit: 2022-05-06 | Source: Ambulatory Visit

## 2022-05-06 ENCOUNTER — Ambulatory Visit
Admission: RE | Admit: 2022-05-06 | Discharge: 2022-05-06 | Disposition: A | Discharge status: Home / Self Care | Source: Ambulatory Visit | Attending: Physician Assistant | Admitting: Physician Assistant

## 2022-05-06 ENCOUNTER — Inpatient Hospital Stay: Admitting: Oncology

## 2022-05-06 ENCOUNTER — Inpatient Hospital Stay: Attending: Oncology

## 2022-05-06 ENCOUNTER — Other Ambulatory Visit: Payer: Self-pay | Admitting: Oncology

## 2022-05-06 VITALS — BP 149/85 | HR 80 | Temp 98.6°F | Resp 16 | Ht 69.0 in | Wt 255.6 lb

## 2022-05-06 DIAGNOSIS — R928 Other abnormal and inconclusive findings on diagnostic imaging of breast: Secondary | ICD-10-CM

## 2022-05-06 DIAGNOSIS — D649 Anemia, unspecified: Secondary | ICD-10-CM

## 2022-05-06 DIAGNOSIS — C9110 Chronic lymphocytic leukemia of B-cell type not having achieved remission: Secondary | ICD-10-CM | POA: Diagnosis present

## 2022-05-06 LAB — CBC AND DIFFERENTIAL
HCT: 37 (ref 36–46)
Hemoglobin: 11.7 — AB (ref 12.0–16.0)
Neutrophils Absolute: 5.92
Platelets: 278 10*3/uL (ref 150–400)
WBC: 17.4

## 2022-05-06 LAB — IRON AND TIBC
Iron: 73 ug/dL (ref 28–170)
Saturation Ratios: 16 % (ref 10.4–31.8)
TIBC: 467 ug/dL — ABNORMAL HIGH (ref 250–450)
UIBC: 394 ug/dL

## 2022-05-06 LAB — CBC: RBC: 4.52 (ref 3.87–5.11)

## 2022-05-06 LAB — FERRITIN: Ferritin: 13 ng/mL (ref 11–307)

## 2022-05-06 NOTE — Progress Notes (Signed)
[facility]  [address] [place],  [place]  [address] [phone]  Clinic Day:  05/06/2022  Referring physician: Lorrene Reid, PA-C  HISTORY OF PRESENT ILLNESS:  Rachel Simmons is a 56 y.o. female with chronic lymphocytic leukemia, which was diagnosed per flow cytometry of her peripheral blood in June 2016.  As her disease has behaved very indolently, it has been followed conservatively, without any particular intervention.  She comes in today for routine follow up.  Since her last visit, the patient has been doing very well.  She denies having any B symptoms or bulky lymphadenopathy which concerns her for catabolic progression of her CLL.    VITALS:  Blood pressure (!) 149/85, pulse 80, temperature 98.6 F (37 C), resp. rate 16, height '5\' 9"'$  (1.753 m), weight 255 lb 9.6 oz (115.9 kg), SpO2 98 %.  Wt Readings from Last 3 Encounters:  05/06/22 255 lb 9.6 oz (115.9 kg)  02/18/22 258 lb (117 kg)  11/17/21 255 lb 3.2 oz (115.8 kg)    Body mass index is 37.75 kg/m.  Performance status (ECOG): 0 - Asymptomatic  PHYSICAL EXAM:  Physical Exam Constitutional:      General: She is not in acute distress.    Appearance: Normal appearance. She is normal weight.  HENT:     Head: Normocephalic and atraumatic.  Eyes:     General: No scleral icterus.    Extraocular Movements: Extraocular movements intact.     Conjunctiva/sclera: Conjunctivae normal.     Pupils: Pupils are equal, round, and reactive to light.  Cardiovascular:     Rate and Rhythm: Normal rate and regular rhythm.     Pulses: Normal pulses.     Heart sounds: Normal heart sounds. No murmur heard.   No friction rub. No gallop.  Pulmonary:     Effort: Pulmonary effort is normal. No respiratory distress.     Breath sounds: Normal breath sounds.  Abdominal:     General: Bowel sounds are normal. There is no distension.     Palpations: Abdomen is soft. There is no hepatomegaly, splenomegaly  or mass.     Tenderness: There is no abdominal tenderness.  Musculoskeletal:        General: Normal range of motion.     Cervical back: Normal range of motion and neck supple.     Right lower leg: No edema.     Left lower leg: No edema.  Lymphadenopathy:     Cervical: No cervical adenopathy.  Skin:    General: Skin is warm and dry.  Neurological:     General: No focal deficit present.     Mental Status: She is alert and oriented to person, place, and time. Mental status is at baseline.  Psychiatric:        Mood and Affect: Mood normal.        Behavior: Behavior normal.        Thought Content: Thought content normal.        Judgment: Judgment normal.    LABS:     Latest Reference Range & Units 05/06/22 14:00  Iron 28 - 170 ug/dL 73  UIBC ug/dL 394  TIBC 250 - 450 ug/dL 467 (H)  Saturation Ratios 10.4 - 31.8 % 16  Ferritin 11 - 307 ng/mL 13  (H): Data is abnormally high  ASSESSMENT & PLAN:  A 56 year old woman with chronic lymphocytic leukemia.  There remains nothing per her labs or physical exam today which suggests  her disease is progressing to where intervention is needed.  The only issue today is that her hemoglobin is slightly lower than what it was previously.  When evaluating her iron parameters, they appear to be borderline low.  Based upon this, I have told the patient to begin taking an iron pill on a daily basis.  Otherwise, the patient continues to do very well.  I Simmons see this patient back in 6 months for repeat clinical assessment.  The patient understands all the plans discussed today and is in agreement with them.  Rachel Freund Macarthur Critchley, MD

## 2022-05-09 ENCOUNTER — Telehealth: Payer: Self-pay

## 2022-05-09 NOTE — Telephone Encounter (Signed)
Dr Bobby Rumpf reviewed labs below, along with Hgb. He recommends pt try taking iron tablet twice daily for 6 months. Pt notified and verbalized understanding.    Latest Reference Range & Units 05/06/22 14:00  Iron 28 - 170 ug/dL 73  UIBC ug/dL 394  TIBC 250 - 450 ug/dL 467 (H)  Saturation Ratios 10.4 - 31.8 % 16  Ferritin 11 - 307 ng/mL 13  (H): Data is abnormally high

## 2022-05-10 ENCOUNTER — Other Ambulatory Visit: Payer: Self-pay | Admitting: Physician Assistant

## 2022-05-10 ENCOUNTER — Telehealth: Payer: Self-pay | Admitting: Oncology

## 2022-05-10 DIAGNOSIS — E559 Vitamin D deficiency, unspecified: Secondary | ICD-10-CM

## 2022-05-10 NOTE — Telephone Encounter (Signed)
Contacted pt to schedule an appt. Unable to reach via phone, voicemail was left.   Scheduling Message Entered by [place], AMY W on 05/09/2022 at 10:37 AM Priority: Routine EST PT 15  Department: [facility]  Provider: Marice Potter, MD  Appointment Notes:  labs and f/u  Scheduling Notes:

## 2022-05-11 ENCOUNTER — Ambulatory Visit (INDEPENDENT_AMBULATORY_CARE_PROVIDER_SITE_OTHER): Admitting: Physician Assistant

## 2022-05-11 ENCOUNTER — Encounter: Payer: Self-pay | Admitting: Physician Assistant

## 2022-05-11 VITALS — BP 123/71 | HR 81 | Temp 97.7°F | Ht 69.0 in | Wt 260.0 lb

## 2022-05-11 DIAGNOSIS — M25562 Pain in left knee: Secondary | ICD-10-CM | POA: Diagnosis not present

## 2022-05-11 DIAGNOSIS — M25469 Effusion, unspecified knee: Secondary | ICD-10-CM

## 2022-05-11 DIAGNOSIS — W19XXXA Unspecified fall, initial encounter: Secondary | ICD-10-CM | POA: Diagnosis not present

## 2022-05-11 MED ORDER — METHYLPREDNISOLONE 4 MG PO TBPK: ORAL_TABLET | ORAL | 0 refills | Status: DC

## 2022-05-11 NOTE — Progress Notes (Signed)
Established patient acute visit   Patient: Rachel Simmons   DOB: 1966-10-30   56 y.o. Female  MRN: [id number] Visit Date: 05/11/2022  Chief Complaint  Patient presents with   Fall   Subjective    HPI  Patient presents with c/o fall which occurred May 29th. States one of her dogs wrapped the leash around her lower leg and when she tried to move the leash became tight leading her to fall and landed on her left side. Knee has been swollen and painful. Bending left knee worsens pain. Has applied ice therapy and elevation.       Medications: Outpatient Medications Prior to Visit  Medication Sig   aspirin 81 MG chewable tablet Chew 81 mg by mouth daily.   atorvastatin (LIPITOR) 10 MG tablet Take 0.5 tablets (5 mg total) by mouth at bedtime.   calcium carbonate (OS-CAL) 600 MG TABS tablet Take 1 tablet by mouth daily.   cetirizine (ZYRTEC) 5 MG tablet Take 1 tablet by mouth daily.   FLUoxetine (PROZAC) 20 MG capsule TAKE 1 CAPSULE DAILY (CONTACT OUR OFFICE TO SCHEDULE A FOLLOW UP FOR FUTURE MEDICATION REFILLS)   glucose blood (FREESTYLE LITE) test strip USE TO CHECK FASTING BLOOD SUGAR AND 2 HOURS AFTER EVERY MEAL.   levothyroxine (SYNTHROID) 112 MCG tablet TAKE 1 TABLET DAILY BEFORE BREAKFAST (NEED APPOINTMENT FOR REFILLS)   lisinopril-hydrochlorothiazide (ZESTORETIC) 20-25 MG tablet Take 1 tablet by mouth daily.   metFORMIN (GLUCOPHAGE) 1000 MG tablet TAKE 1 TABLET TWICE A DAY WITH MEALS (NEED APPOINTMENT FOR REFILLS)   Multiple Vitamins-Minerals (ONE-A-DAY WOMENS 50 PLUS PO) Take 1 tablet daily by mouth.   Omega-3 Fatty Acids (FISH OIL) 1200 MG CAPS Take 4-5 tabs daily   Vitamin D, Ergocalciferol, (DRISDOL) 1.25 MG (50000 UNIT) CAPS capsule TAKE 1 CAPSULE EVERY 7 DAYS   No facility-administered medications prior to visit.    Review of Systems Review of Systems:  A fourteen system review of systems was performed and found to be positive as per HPI.  Last CBC Lab Results   Component Value Date   WBC 17.4 05/06/2022   HGB 11.7 (A) 05/06/2022   HCT 37 05/06/2022   MCV 84 04/15/2022   MCH 26.7 04/15/2022   RDW 14.1 04/15/2022   PLT 278 48/12/6551   Last metabolic panel Lab Results  Component Value Date   GLUCOSE 115 (H) 04/15/2022   NA 139 04/15/2022   K 4.7 04/15/2022   CL 105 04/15/2022   CO2 25 04/15/2022   BUN 14 04/15/2022   CREATININE 0.53 (L) 04/15/2022   EGFR 110 04/15/2022   CALCIUM 10.0 04/15/2022   PHOS 3.9 08/09/2017   PROT 6.1 04/15/2022   ALBUMIN 4.1 04/15/2022   LABGLOB 2.0 04/15/2022   AGRATIO 2.1 04/15/2022   BILITOT <0.2 04/15/2022   ALKPHOS 78 04/15/2022   AST 19 04/15/2022   ALT 20 04/15/2022   Last lipids Lab Results  Component Value Date   CHOL 179 04/15/2022   HDL 55 04/15/2022   LDLCALC 93 04/15/2022   TRIG 180 (H) 04/15/2022   CHOLHDL 3.3 04/15/2022   Last hemoglobin A1c Lab Results  Component Value Date   HGBA1C 6.4 (H) 04/15/2022   Last thyroid functions Lab Results  Component Value Date   TSH 1.860 04/15/2022   T3TOTAL 128 08/19/2020   Last vitamin D Lab Results  Component Value Date   VD25OH 48.0 02/18/2022   Last vitamin B12 and Folate Lab Results  Component Value Date  VITAMINB12 978 08/09/2017     Objective    BP 123/71   Pulse 81   Temp 97.7 F (36.5 C)   Ht 5' 9" (1.753 m)   Wt 260 lb (117.9 kg)   SpO2 98%   BMI 38.40 kg/m    Physical Exam  General:  Well Developed, well nourished, appropriate for stated age.  Neuro:  Alert and oriented,  extra-ocular muscles intact  HEENT:  Normocephalic, atraumatic, neck supple  Skin:  no gross rash, warm, pink. Cardiac:  RRR, S1 S2 Respiratory: CTA B/L  MSK: significant swelling of left knee which radiates to lower leg, effusion appreciated, generalized tenderness of left knee, bruising is improving, limited ROM with extension and flexion of left knee Vascular:  Ext warm, no cyanosis apprec.; cap RF less 2 sec. Psych:  No HI/SI,  judgement and insight good, Euthymic mood. Full Affect.   No results found for any visits on 05/11/22.  Assessment & Plan     Patient presenting with acute left knee pain post a fall with significant swelling and tenderness so will place urgent referral to orthopedics for further evaluation. Will start Medrol Dosepak and recommend to continue with RICE therapy.    Return if symptoms worsen or fail to improve.        Lorrene Reid, PA-C  [facility] [phone] (phone) [phone] (fax)  [place]

## 2022-05-11 NOTE — Patient Instructions (Signed)

## 2022-05-12 ENCOUNTER — Other Ambulatory Visit (INDEPENDENT_AMBULATORY_CARE_PROVIDER_SITE_OTHER): Admitting: Oncology

## 2022-05-12 DIAGNOSIS — D649 Anemia, unspecified: Secondary | ICD-10-CM

## 2022-05-12 DIAGNOSIS — C9110 Chronic lymphocytic leukemia of B-cell type not having achieved remission: Secondary | ICD-10-CM | POA: Diagnosis not present

## 2022-05-13 ENCOUNTER — Ambulatory Visit: Admitting: Physician Assistant

## 2022-05-18 ENCOUNTER — Ambulatory Visit (INDEPENDENT_AMBULATORY_CARE_PROVIDER_SITE_OTHER)

## 2022-05-18 ENCOUNTER — Ambulatory Visit (INDEPENDENT_AMBULATORY_CARE_PROVIDER_SITE_OTHER): Admitting: Physician Assistant

## 2022-05-18 ENCOUNTER — Other Ambulatory Visit: Payer: Self-pay

## 2022-05-18 DIAGNOSIS — M79605 Pain in left leg: Secondary | ICD-10-CM

## 2022-05-18 MED ORDER — PREDNISONE 10 MG (21) PO TBPK: ORAL_TABLET | ORAL | 0 refills | Status: DC

## 2022-05-18 MED ORDER — TRAMADOL HCL 50 MG PO TABS: 50.0000 mg | ORAL_TABLET | Freq: Four times a day (QID) | ORAL | 2 refills | Status: DC | PRN

## 2022-05-18 NOTE — Progress Notes (Signed)
Office Visit Note   Patient: Rachel Simmons           Date of Birth: 1965/12/29           MRN: [id number] Visit Date: 05/18/2022              Requested by: Lorrene Reid, PA-C [address],  [place] [address] PCP: Lorrene Reid, PA-C   Assessment & Plan: Visit Diagnoses:  1. Pain in left leg     Plan: Impression is acute left leg pain and swelling.  At this point, I recommended another course of steroids as well as a compression sock as there is no evidence of fracture on x-ray.  It is somewhat hard to assess clinically as she has diffuse tenderness throughout the entire leg.  I would like to go ahead and order a venous Doppler ultrasound left lower extremity to rule out DVT.  She will follow-up next week for recheck.  Call with concerns or questions in the meantime.  Follow-Up Instructions: Return in about 1 week (around 05/25/2022).   Orders:  Orders Placed This Encounter  Procedures   XR KNEE 3 VIEW LEFT   XR Ankle Complete Left   Meds ordered this encounter  Medications   predniSONE (STERAPRED UNI-PAK 21 TAB) 10 MG (21) TBPK tablet    Sig: Take as directed    Dispense:  21 tablet    Refill:  0   traMADol (ULTRAM) 50 MG tablet    Sig: Take 1 tablet (50 mg total) by mouth every 6 (six) hours as needed.    Dispense:  30 tablet    Refill:  2      Procedures: No procedures performed   Clinical Data: No additional findings.   Subjective: Chief Complaint  Patient presents with   Left Knee - Pain    HPI patient is a very pleasant 56 year old female who comes in today with an injury to her left leg.  On 05/02/2022 she was entangled in her dog's lead when she fell landing on her left side.  She has had pain from the left mid thigh all the way down to the ankle ever since.  She describes this as a constant pain with hypersensitivity.  She also has had significant swelling to the left lower extremity.  Symptoms are worse with any touch or  pressure applied to the leg.  She does not note any increased pain with bearing weight.  She has been taking a steroid given to her by her PCP which has slightly helped with the swelling.  She denies any personal or family history of DVT/PE.  She is a non-smoker.  She does not use oral contraceptives.  No chest pain or shortness of breath.  Review of Systems as detailed in HPI.  All others reviewed and are negative.   Objective: Vital Signs: There were no vitals taken for this visit.  Physical Exam well-developed well-nourished female no acute distress.  Alert and oriented x3.  Ortho Exam left lower extremity exam: Painless logroll and FADIR.  She has diffuse tenderness all the way from the mid thigh throughout the entire ankle to include the calf.  She has slight pain with knee flexion and extension.  Slight pain with ankle range of motion.  Moderate swelling throughout the entire left lower extremity.  She is neurovascular intact distally.  Specialty Comments:  No specialty comments available.  Imaging: XR Ankle Complete Left  Result Date: 05/18/2022 No acute  or structural abnormalities  XR KNEE 3 VIEW LEFT  Result Date: 05/18/2022 Degenerative changes medial compartment.  No acute fracture or structural abnormalities.    PMFS History: Patient Active Problem List   Diagnosis Date Noted   Acute recurrent pansinusitis 11/17/2021   Cough 11/17/2021   Antibiotic-induced yeast infection 11/17/2021   At risk for impaired metabolic function 67/89/3810   Diabetes mellitus ([place]) 11/09/2020   Vitamin D deficiency 11/09/2020   At risk for hypoglycemia 11/09/2020   Essential hypertension 10/05/2020   Bilateral leg cramps 10/05/2020   At risk for dehydration 10/05/2020   Morbid obesity ([place]) 08/19/2020   depressed mood, recurrent 01/30/2019   Flu-like symptoms 12/18/2018   Myalgias and generalized arthralgias 06/20/2018   High risk for vaginitis due to Candida secondary to antibiotic  use 06/06/2018   Left tennis elbow 10/10/2017   Muscle dysfunction/ spasm- upper traps on L 10/10/2017   Hyperlipidemia associated with type 2 diabetes mellitus ([place]) 08/29/2017   Hypothyroidism 08/29/2017   Hypertriglyceridemia 10/18/2016   Insomnia 10/18/2016   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 10/18/2016   Family history of colon cancer requiring screening colonoscopy 08/20/2016   Obesity, Class III, BMI 40-49.9 (morbid obesity) ([place]) 08/20/2016   Adjustment disorder with mixed anxiety and depressed mood 08/20/2016   CLL (chronic lymphocytic leukemia) ([place]) 08/18/2016   Type II Diabetes mellitus without complication ([place]) 17/51/0258   Hypertension associated with diabetes ([place]) 08/18/2016   Thyroid disease 08/18/2016   Chronic back pain greater than 3 months duration 08/18/2016   Genetic testing 05/21/2015   Family history of breast cancer    Family history of colon cancer- brother age 34    Family history of polyps in the colon    Diabetic peripheral neuropathy ([place]) 04/21/2015   Past Medical History:  Diagnosis Date   Back pain    Chronic lymphoblastic leukemia    Diabetes mellitus without complication (HCC)    Edema, lower extremity    Family history of breast cancer    Family history of colon cancer    Family history of polyps in the colon    Hypertension    Joint pain    Prediabetes    Thyroid disease     Family History  Problem Relation Age of Onset   Breast cancer Mother        81s; deceased 2   Colon polyps Mother        Dx 60s; #/type unknown   Diabetes Mother    Heart disease Mother    Thyroid disease Mother    Sleep apnea Mother    Obesity Mother    Cancer Father        lung cancer; smoker; deceased 66   Alcoholism Father    Breast cancer Sister 30       triple negative; currently 30   Cancer Maternal Aunt        lung cancer; smoker; currently 64   Colon cancer Maternal Uncle        Dx 68s; currently 33   Breast cancer Paternal Aunt        Dx  68s; currently 69s   Cancer Paternal Uncle        liver; heavy drinker   Cancer Maternal Grandfather        unk. primary; deceased 49s   Cancer Paternal Grandmother        Dx 73s; unknown primary   Breast cancer Sister 53       currently 72  Uterine cancer Sister 43       currently 23   Cancer Paternal Uncle        lung; heavy smoker   Breast cancer Cousin        mat first cousin through aunt; poss. breast ca; had mastectomy; currently 50   Ovarian cancer Other        mat grandmother's sister   Cancer Other        stomach cancer; mat grandmother's sister   Colon polyps Brother        age, #, type unknown    Past Surgical History:  Procedure Laterality Date   ABDOMINAL HYSTERECTOMY  2006   reports ovaries and cervix are intact   TONSILLECTOMY     Social History   Occupational History   Occupation: [employer]  Tobacco Use   Smoking status: Never   Smokeless tobacco: Never  Vaping Use   Vaping Use: Never used  Substance and Sexual Activity   Alcohol use: No   Drug use: No   Sexual activity: Yes    Birth control/protection: None

## 2022-05-20 ENCOUNTER — Ambulatory Visit (HOSPITAL_COMMUNITY)
Admission: RE | Admit: 2022-05-20 | Discharge: 2022-05-20 | Disposition: A | Discharge status: Home / Self Care | Source: Ambulatory Visit | Attending: Physician Assistant | Admitting: Physician Assistant

## 2022-05-20 ENCOUNTER — Telehealth: Payer: Self-pay

## 2022-05-20 ENCOUNTER — Encounter: Payer: Self-pay | Admitting: *Deleted

## 2022-05-20 DIAGNOSIS — M79605 Pain in left leg: Secondary | ICD-10-CM | POA: Insufficient documentation

## 2022-05-20 NOTE — Telephone Encounter (Signed)
FYI-  Per Megan with [facility], patient is Negative for DVT, left leg.  Please advise.  Thank you.

## 2022-05-23 NOTE — Telephone Encounter (Signed)
Thank you :)

## 2022-05-27 ENCOUNTER — Ambulatory Visit: Admitting: Orthopaedic Surgery

## 2022-05-27 ENCOUNTER — Encounter: Payer: Self-pay | Admitting: Orthopaedic Surgery

## 2022-05-27 ENCOUNTER — Encounter: Payer: Self-pay | Admitting: Obstetrics & Gynecology

## 2022-05-27 ENCOUNTER — Ambulatory Visit: Admitting: Obstetrics & Gynecology

## 2022-05-27 VITALS — BP 112/76 | Ht 67.5 in | Wt 257.0 lb

## 2022-05-27 DIAGNOSIS — N750 Cyst of Bartholin's gland: Secondary | ICD-10-CM

## 2022-05-27 DIAGNOSIS — M79605 Pain in left leg: Secondary | ICD-10-CM | POA: Insufficient documentation

## 2022-05-27 DIAGNOSIS — N939 Abnormal uterine and vaginal bleeding, unspecified: Secondary | ICD-10-CM

## 2022-05-27 DIAGNOSIS — Z90711 Acquired absence of uterus with remaining cervical stump: Secondary | ICD-10-CM | POA: Diagnosis not present

## 2022-05-27 DIAGNOSIS — N762 Acute vulvitis: Secondary | ICD-10-CM | POA: Diagnosis not present

## 2022-05-27 MED ORDER — DICLOFENAC SODIUM 75 MG PO TBEC: 75.0000 mg | DELAYED_RELEASE_TABLET | Freq: Two times a day (BID) | ORAL | 2 refills | Status: DC | PRN

## 2022-05-27 MED ORDER — METHOCARBAMOL 500 MG PO TABS: 500.0000 mg | ORAL_TABLET | Freq: Two times a day (BID) | ORAL | 2 refills | Status: DC | PRN

## 2022-05-27 MED ORDER — NYSTATIN-TRIAMCINOLONE 100000-0.1 UNIT/GM-% EX OINT: 1.0000 | TOPICAL_OINTMENT | Freq: Two times a day (BID) | CUTANEOUS | 0 refills | Status: DC

## 2022-06-22 NOTE — Patient Instructions (Signed)
Preventive Care 55-55 Years Old, Female Preventive care refers to lifestyle choices and visits with your health care provider that can promote health and wellness. Preventive care visits are also called wellness exams. What can I expect for my preventive care visit? Counseling Your health care provider may ask you questions about your: Medical history, including: Past medical problems. Family medical history. Pregnancy history. Current health, including: Menstrual cycle. Method of birth control. Emotional well-being. Home life and relationship well-being. Sexual activity and sexual health. Lifestyle, including: Alcohol, nicotine or tobacco, and drug use. Access to firearms. Diet, exercise, and sleep habits. Work and work environment. Sunscreen use. Safety issues such as seatbelt and bike helmet use. Physical exam Your health care provider will check your: Height and weight. These may be used to calculate your BMI (body mass index). BMI is a measurement that tells if you are at a healthy weight. Waist circumference. This measures the distance around your waistline. This measurement also tells if you are at a healthy weight and may help predict your risk of certain diseases, such as type 2 diabetes and high blood pressure. Heart rate and blood pressure. Body temperature. Skin for abnormal spots. What immunizations do I need?  Vaccines are usually given at various ages, according to a schedule. Your health care provider will recommend vaccines for you based on your age, medical history, and lifestyle or other factors, such as travel or where you work. What tests do I need? Screening Your health care provider may recommend screening tests for certain conditions. This may include: Lipid and cholesterol levels. Diabetes screening. This is done by checking your blood sugar (glucose) after you have not eaten for a while (fasting). Pelvic exam and Pap test. Hepatitis B test. Hepatitis C  test. HIV (human immunodeficiency virus) test. STI (sexually transmitted infection) testing, if you are at risk. Lung cancer screening. Colorectal cancer screening. Mammogram. Talk with your health care provider about when you should start having regular mammograms. This may depend on whether you have a family history of breast cancer. BRCA-related cancer screening. This may be done if you have a family history of breast, ovarian, tubal, or peritoneal cancers. Bone density scan. This is done to screen for osteoporosis. Talk with your health care provider about your test results, treatment options, and if necessary, the need for more tests. Follow these instructions at home: Eating and drinking  Eat a diet that includes fresh fruits and vegetables, whole grains, lean protein, and low-fat dairy products. Take vitamin and mineral supplements as recommended by your health care provider. Do not drink alcohol if: Your health care provider tells you not to drink. You are pregnant, may be pregnant, or are planning to become pregnant. If you drink alcohol: Limit how much you have to 0-1 drink a day. Know how much alcohol is in your drink. In the [place] one drink equals one 12 oz bottle of beer (355 mL), one 5 oz glass of wine (148 mL), or one 1 oz glass of hard liquor (44 mL). Lifestyle Brush your teeth every morning and night with fluoride toothpaste. Floss one time each day. Exercise for at least 30 minutes 5 or more days each week. Do not use any products that contain nicotine or tobacco. These products include cigarettes, chewing tobacco, and vaping devices, such as e-cigarettes. If you need help quitting, ask your health care provider. Do not use drugs. If you are sexually active, practice safe sex. Use a condom or other form of protection to   prevent STIs. If you do not wish to become pregnant, use a form of birth control. If you plan to become pregnant, see your health care provider for a  prepregnancy visit. Take aspirin only as told by your health care provider. Make sure that you understand how much to take and what form to take. Work with your health care provider to find out whether it is safe and beneficial for you to take aspirin daily. Find healthy ways to manage stress, such as: Meditation, yoga, or listening to music. Journaling. Talking to a trusted person. Spending time with friends and family. Minimize exposure to UV radiation to reduce your risk of skin cancer. Safety Always wear your seat belt while driving or riding in a vehicle. Do not drive: If you have been drinking alcohol. Do not ride with someone who has been drinking. When you are tired or distracted. While texting. If you have been using any mind-altering substances or drugs. Wear a helmet and other protective equipment during sports activities. If you have firearms in your house, make sure you follow all gun safety procedures. Seek help if you have been physically or sexually abused. What's next? Visit your health care provider once a year for an annual wellness visit. Ask your health care provider how often you should have your eyes and teeth checked. Stay up to date on all vaccines. This information is not intended to replace advice given to you by your health care provider. Make sure you discuss any questions you have with your health care provider. Document Revised: 05/19/2021 Document Reviewed: 05/19/2021 Elsevier Patient Education  [place].

## 2022-06-24 ENCOUNTER — Encounter: Payer: Self-pay | Admitting: Physician Assistant

## 2022-06-24 ENCOUNTER — Ambulatory Visit (INDEPENDENT_AMBULATORY_CARE_PROVIDER_SITE_OTHER): Admitting: Physician Assistant

## 2022-06-24 VITALS — BP 116/80 | HR 76 | Temp 98.3°F | Wt 261.0 lb

## 2022-06-24 DIAGNOSIS — E1169 Type 2 diabetes mellitus with other specified complication: Secondary | ICD-10-CM | POA: Diagnosis not present

## 2022-06-24 DIAGNOSIS — F4323 Adjustment disorder with mixed anxiety and depressed mood: Secondary | ICD-10-CM | POA: Diagnosis not present

## 2022-06-24 DIAGNOSIS — F339 Major depressive disorder, recurrent, unspecified: Secondary | ICD-10-CM | POA: Diagnosis not present

## 2022-06-24 DIAGNOSIS — E1159 Type 2 diabetes mellitus with other circulatory complications: Secondary | ICD-10-CM | POA: Diagnosis not present

## 2022-06-24 DIAGNOSIS — I152 Hypertension secondary to endocrine disorders: Secondary | ICD-10-CM

## 2022-06-24 DIAGNOSIS — E785 Hyperlipidemia, unspecified: Secondary | ICD-10-CM

## 2022-06-24 MED ORDER — OZEMPIC (0.25 OR 0.5 MG/DOSE) 2 MG/1.5ML ~~LOC~~ SOPN
PEN_INJECTOR | SUBCUTANEOUS | 0 refills | Status: DC
Start: 2022-06-24 — End: 2022-07-27

## 2022-06-24 MED ORDER — ATORVASTATIN CALCIUM 10 MG PO TABS: 5.0000 mg | ORAL_TABLET | Freq: Every day | ORAL | 1 refills | Status: DC

## 2022-06-24 MED ORDER — FLUOXETINE HCL 20 MG PO CAPS: ORAL_CAPSULE | ORAL | 1 refills | Status: DC

## 2022-06-24 NOTE — Progress Notes (Signed)
Established patient visit   Patient: Rachel Simmons   DOB: 1966/09/03   56 y.o. Female  MRN: [id number] Visit Date: 06/24/2022  Chief Complaint  Patient presents with   Follow-up   Subjective    HPI  Patient presents for chronic follow-up.  Diabetes mellitus: No increased urination or thirst. Pt reports medication compliance. No hypoglycemic events. Checking glucose at home. FBS have been fluctuating. Reports has reduced pasta intake.   HTN: Pt denies chest pain, palpitations, dizziness or severe lower extremity swelling. Taking medication as directed without side effects. Checks   HLD: Pt taking medication as directed without issues. No myalgias or muscle weakness. Left knee and leg are doing better.  Mood: Reports mood has been stable. Reports medication compliance.      06/24/2022    8:53 AM 05/11/2022    2:48 PM 02/18/2022    8:21 AM 11/17/2021    4:31 PM 10/25/2021    2:32 PM  Depression screen PHQ 2/9  Decreased Interest 0 0 0 0 0  Down, Depressed, Hopeless 0 0 0 0 0  PHQ - 2 Score 0 0 0 0 0  Altered sleeping 0  0  0  Tired, decreased energy 0  0 0 0  Change in appetite 0  0 0 0  Feeling bad or failure about yourself  0  0 0 0  Trouble concentrating 0  0 0 0  Moving slowly or fidgety/restless 0  0 0 0  Suicidal thoughts 0  0 0 0  PHQ-9 Score 0  0  0  Difficult doing work/chores Not difficult at all  Not difficult at all  Not difficult at all      06/24/2022    8:54 AM 02/18/2022    8:21 AM 11/17/2021    4:31 PM 10/25/2021    2:32 PM  GAD 7 : Generalized Anxiety Score  Nervous, Anxious, on Edge 0 0 0 0  Control/stop worrying 0 0 0 0  Worry too much - different things 0 0 0 0  Trouble relaxing 0 0 0 0  Restless 0 0 0 0  Easily annoyed or irritable 0 0 0 0  Afraid - awful might happen 0 0 0 0  Total GAD 7 Score 0 0 0 0  Anxiety Difficulty Not difficult at all Not difficult at all  Not difficult at all     Medications: Outpatient Medications Prior to  Visit  Medication Sig Note   ASPIRIN 81 PO Take by mouth.    cetirizine (ZYRTEC) 5 MG tablet Take 1 tablet by mouth daily.    Ferrous Sulfate (IRON PO) Take by mouth 2 (two) times daily.    glucose blood (FREESTYLE LITE) test strip USE TO CHECK FASTING BLOOD SUGAR AND 2 HOURS AFTER EVERY MEAL.    levothyroxine (SYNTHROID) 112 MCG tablet TAKE 1 TABLET DAILY BEFORE BREAKFAST (NEED APPOINTMENT FOR REFILLS)    lisinopril-hydrochlorothiazide (ZESTORETIC) 20-25 MG tablet Take 1 tablet by mouth daily.    metFORMIN (GLUCOPHAGE) 1000 MG tablet TAKE 1 TABLET TWICE A DAY WITH MEALS (NEED APPOINTMENT FOR REFILLS)    Multiple Vitamins-Minerals (ONE-A-DAY WOMENS 50 PLUS PO) Take 1 tablet daily by mouth.    nystatin-triamcinolone ointment (MYCOLOG) Apply 1 Application topically 2 (two) times daily. Thin application on affected vulva twice a day x 2 weeks    Omega-3 Fatty Acids (FISH OIL) 1200 MG CAPS Take 4-5 tabs daily    Vitamin D, Ergocalciferol, (DRISDOL) 1.25 MG (50000 UNIT) CAPS capsule  TAKE 1 CAPSULE EVERY 7 DAYS    [DISCONTINUED] atorvastatin (LIPITOR) 10 MG tablet Take 0.5 tablets (5 mg total) by mouth at bedtime.    [DISCONTINUED] FLUoxetine (PROZAC) 20 MG capsule TAKE 1 CAPSULE DAILY (CONTACT OUR OFFICE TO SCHEDULE A FOLLOW UP FOR FUTURE MEDICATION REFILLS)    [DISCONTINUED] diclofenac (VOLTAREN) 75 MG EC tablet Take 1 tablet (75 mg total) by mouth 2 (two) times daily as needed. (Patient not taking: Reported on 06/24/2022) 06/24/2022: no longer taking   [DISCONTINUED] methocarbamol (ROBAXIN) 500 MG tablet Take 1 tablet (500 mg total) by mouth 2 (two) times daily as needed for muscle spasms. (Patient not taking: Reported on 05/27/2022) 06/24/2022: no longer taking   [DISCONTINUED] traMADol (ULTRAM) 50 MG tablet Take 1 tablet (50 mg total) by mouth every 6 (six) hours as needed. (Patient not taking: Reported on 06/24/2022)    No facility-administered medications prior to visit.    Review of  Systems Review of Systems:  A fourteen system review of systems was performed and found to be positive as per HPI.  Last CBC Lab Results  Component Value Date   WBC 17.4 05/06/2022   HGB 11.7 (A) 05/06/2022   HCT 37 05/06/2022   MCV 84 04/15/2022   MCH 26.7 04/15/2022   RDW 14.1 04/15/2022   PLT 278 33/29/5188   Last metabolic panel Lab Results  Component Value Date   GLUCOSE 115 (H) 04/15/2022   NA 139 04/15/2022   K 4.7 04/15/2022   CL 105 04/15/2022   CO2 25 04/15/2022   BUN 14 04/15/2022   CREATININE 0.53 (L) 04/15/2022   EGFR 110 04/15/2022   CALCIUM 10.0 04/15/2022   PHOS 3.9 08/09/2017   PROT 6.1 04/15/2022   ALBUMIN 4.1 04/15/2022   LABGLOB 2.0 04/15/2022   AGRATIO 2.1 04/15/2022   BILITOT <0.2 04/15/2022   ALKPHOS 78 04/15/2022   AST 19 04/15/2022   ALT 20 04/15/2022   Last lipids Lab Results  Component Value Date   CHOL 179 04/15/2022   HDL 55 04/15/2022   LDLCALC 93 04/15/2022   TRIG 180 (H) 04/15/2022   CHOLHDL 3.3 04/15/2022   Last hemoglobin A1c Lab Results  Component Value Date   HGBA1C 6.4 (H) 04/15/2022   Last thyroid functions Lab Results  Component Value Date   TSH 1.860 04/15/2022   T3TOTAL 128 08/19/2020     Objective    BP 116/80   Pulse 76   Temp 98.3 F (36.8 C) (Oral)   Wt 261 lb (118.4 kg)   BMI 40.28 kg/m  BP Readings from Last 3 Encounters:  06/24/22 116/80  05/27/22 112/76  05/11/22 123/71   Wt Readings from Last 3 Encounters:  06/24/22 261 lb (118.4 kg)  05/27/22 257 lb (116.6 kg)  05/11/22 260 lb (117.9 kg)    Physical Exam  General:  Well Developed, well nourished, appropriate for stated age.  Neuro:  Alert and oriented,  extra-ocular muscles intact  HEENT:  Normocephalic, atraumatic, neck supple  Skin:  no gross rash, warm, pink. Cardiac:  RRR, S1 S2 Respiratory: CTA B/L  Vascular:  Ext warm, no cyanosis apprec.; cap RF less 2 sec. Psych:  No HI/SI, judgement and insight good, Euthymic mood. Full  Affect.   No results found for any visits on 06/24/22.  Assessment & Plan      Problem List Items Addressed This Visit       Cardiovascular and Mediastinum   Hypertension associated with diabetes ([place]) (Chronic)    -  Controlled. Continue current medication regimen. Will collect CMP for medication monitoring. Will continue to monitor.      Relevant Medications   atorvastatin (LIPITOR) 10 MG tablet   Semaglutide,0.25 or 0.5MG/DOS, (OZEMPIC, 0.25 OR 0.5 MG/DOSE,) 2 MG/1.5ML SOPN   Other Relevant Orders   CBC w/Diff   Comp Met (CMET)     Endocrine   Hyperlipidemia associated with type 2 diabetes mellitus (HCC) (Chronic)    -Last lipid panel: LDL 93 (goal<70). -Will repeat lipid panel and hepatic function today. -Continue current medication regimen. Provided medication refill. Will continue to monitor.      Relevant Medications   atorvastatin (LIPITOR) 10 MG tablet   Semaglutide,0.25 or 0.5MG/DOS, (OZEMPIC, 0.25 OR 0.5 MG/DOSE,) 2 MG/1.5ML SOPN   Other Relevant Orders   Lipid Profile   Diabetes mellitus ([place]) - Primary    -Discussed with patient GLP-1 therapy which can help reduce patient's cardiovascular risk and also treat diabetes mellitus. Discussed with patient potential side effects including, but not limited to, pancreatitis, nausea, diarrhea, or constipation. No personal or family hx of MEN or MTC. Will start Ozempic 0.25 mg once a week x 4 weeks and then increase to 0.5 mg. Advised can hold Metformin when she starts Ozempic, but if her fasting sugars are running high (>150) then recommend resuming Metformin. Pt verbalized understanding. Recommend to continue with diet changes including reducing carbohydrates. Too soon to repeat A1c, will repeat at f/up visit. Will continue to monitor.      Relevant Medications   atorvastatin (LIPITOR) 10 MG tablet   Semaglutide,0.25 or 0.5MG/DOS, (OZEMPIC, 0.25 OR 0.5 MG/DOSE,) 2 MG/1.5ML SOPN     Other   Adjustment disorder with mixed  anxiety and depressed mood (Chronic)    -Controlled. PHQ-9 and GAD-7 scores 0. Continue current medication regimen. Will continue to monitor.      Relevant Medications   FLUoxetine (PROZAC) 20 MG capsule   depressed mood, recurrent (Chronic)    -Controlled. Continue current medication regimen. Will continue to monitor.      Relevant Medications   FLUoxetine (PROZAC) 20 MG capsule    Return in about 4 months (around 10/25/2022) for DM, HTN, mood.        Lorrene Reid, PA-C  [facility] at [facility] [phone] (phone) [phone] (fax)  [place]

## 2022-06-24 NOTE — Assessment & Plan Note (Signed)
-  Controlled. Continue current medication regimen. Will continue to monitor. 

## 2022-06-24 NOTE — Assessment & Plan Note (Addendum)
>>  ASSESSMENT AND PLAN FOR ANXIETY AND DEPRESSION WRITTEN ON 06/24/2022  9:46 AM BY Jeanee Fabre, PA-C  -Controlled. PHQ-9 and GAD-7 scores 0. Continue current medication regimen. Will continue to monitor.  >>ASSESSMENT AND PLAN FOR DEPRESSED MOOD, RECURRENT WRITTEN ON 06/24/2022  9:45 AM BY Enriqueta Augusta, PA-C  -Controlled. Continue current medication regimen. Will continue to monitor.

## 2022-06-24 NOTE — Assessment & Plan Note (Signed)
>>  ASSESSMENT AND PLAN FOR DIABETES MELLITUS WRITTEN ON 06/24/2022  9:57 AM BY ABONZA, MARITZA, PA-C  -Discussed with patient GLP-1 therapy which can help reduce patient's cardiovascular risk and also treat diabetes mellitus. Discussed with patient potential side effects including, but not limited to, pancreatitis, nausea, diarrhea, or constipation. No personal or family hx of MEN or MTC. Will start Ozempic 0.25 mg once a week x 4 weeks and then increase to 0.5 mg. Advised can hold Metformin when she starts Ozempic, but if her fasting sugars are running high (>150) then recommend resuming Metformin. Pt verbalized understanding. Recommend to continue with diet changes including reducing carbohydrates. Too soon to repeat A1c, will repeat at f/up visit. Will continue to monitor.

## 2022-06-24 NOTE — Assessment & Plan Note (Signed)
-  Last lipid panel: LDL 93 (goal<70). -Will repeat lipid panel and hepatic function today. -Continue current medication regimen. Provided medication refill. Will continue to monitor.

## 2022-06-24 NOTE — Assessment & Plan Note (Addendum)
-  Discussed with patient GLP-1 therapy which can help reduce patient's cardiovascular risk and also treat diabetes mellitus. Discussed with patient potential side effects including, but not limited to, pancreatitis, nausea, diarrhea, or constipation. No personal or family hx of MEN or MTC. Will start Ozempic 0.25 mg once a week x 4 weeks and then increase to 0.5 mg. Advised can hold Metformin when she starts Ozempic, but if her fasting sugars are running high (>150) then recommend resuming Metformin. Pt verbalized understanding. Recommend to continue with diet changes including reducing carbohydrates. Too soon to repeat A1c, will repeat at f/up visit. Will continue to monitor.

## 2022-06-24 NOTE — Assessment & Plan Note (Signed)
-  Controlled. -Continue current medication regimen. -Will collect CMP for medication monitoring. -Will continue to monitor. 

## 2022-06-25 LAB — LIPID PANEL
Chol/HDL Ratio: 3.1 ratio (ref 0.0–4.4)
Cholesterol, Total: 164 mg/dL (ref 100–199)
HDL: 53 mg/dL (ref >39)
LDL Chol Calc (NIH): 84 mg/dL (ref 0–99)
Triglycerides: 154 mg/dL — ABNORMAL HIGH (ref 0–149)
VLDL Cholesterol Cal: 27 mg/dL (ref 5–40)

## 2022-06-25 LAB — CBC WITH DIFFERENTIAL/PLATELET
Basophils Absolute: 0.1 10*3/uL (ref 0.0–0.2)
Basos: 1 %
EOS (ABSOLUTE): 0.2 10*3/uL (ref 0.0–0.4)
Eos: 1 %
Hematocrit: 39.5 % (ref 34.0–46.6)
Hemoglobin: 12.3 g/dL (ref 11.1–15.9)
Immature Grans (Abs): 0 10*3/uL (ref 0.0–0.1)
Immature Granulocytes: 0 %
Lymphocytes Absolute: 8.1 10*3/uL — ABNORMAL HIGH (ref 0.7–3.1)
Lymphs: 64 %
MCH: 26.7 pg (ref 26.6–33.0)
MCHC: 31.1 g/dL — ABNORMAL LOW (ref 31.5–35.7)
MCV: 86 fL (ref 79–97)
Monocytes Absolute: 0.6 10*3/uL (ref 0.1–0.9)
Monocytes: 5 %
Neutrophils Absolute: 3.6 10*3/uL (ref 1.4–7.0)
Neutrophils: 29 %
Platelets: 262 10*3/uL (ref 150–450)
RBC: 4.6 x10E6/uL (ref 3.77–5.28)
RDW: 14.5 % (ref 11.7–15.4)
WBC: 12.6 10*3/uL — ABNORMAL HIGH (ref 3.4–10.8)

## 2022-06-25 LAB — COMPREHENSIVE METABOLIC PANEL
ALT: 23 IU/L (ref 0–32)
AST: 22 IU/L (ref 0–40)
Albumin/Globulin Ratio: 1.8 (ref 1.2–2.2)
Albumin: 4.2 g/dL (ref 3.8–4.9)
Alkaline Phosphatase: 75 IU/L (ref 44–121)
BUN/Creatinine Ratio: 17 (ref 9–23)
BUN: 10 mg/dL (ref 6–24)
Bilirubin Total: 0.3 mg/dL (ref 0.0–1.2)
CO2: 27 mmol/L (ref 20–29)
Calcium: 10.3 mg/dL — ABNORMAL HIGH (ref 8.7–10.2)
Chloride: 101 mmol/L (ref 96–106)
Creatinine, Ser: 0.59 mg/dL (ref 0.57–1.00)
Globulin, Total: 2.3 g/dL (ref 1.5–4.5)
Glucose: 101 mg/dL — ABNORMAL HIGH (ref 70–99)
Potassium: 4.2 mmol/L (ref 3.5–5.2)
Sodium: 138 mmol/L (ref 134–144)
Total Protein: 6.5 g/dL (ref 6.0–8.5)
eGFR: 107 mL/min/{1.73_m2} (ref >59)

## 2022-06-28 ENCOUNTER — Encounter: Payer: Self-pay | Admitting: Physician Assistant

## 2022-06-29 ENCOUNTER — Ambulatory Visit: Admitting: Obstetrics & Gynecology

## 2022-07-13 ENCOUNTER — Encounter (INDEPENDENT_AMBULATORY_CARE_PROVIDER_SITE_OTHER): Payer: Self-pay

## 2022-07-27 ENCOUNTER — Encounter: Payer: Self-pay | Admitting: Physician Assistant

## 2022-07-27 ENCOUNTER — Other Ambulatory Visit: Payer: Self-pay | Admitting: Physician Assistant

## 2022-07-27 DIAGNOSIS — E1169 Type 2 diabetes mellitus with other specified complication: Secondary | ICD-10-CM

## 2022-07-27 MED ORDER — OZEMPIC (0.25 OR 0.5 MG/DOSE) 2 MG/1.5ML ~~LOC~~ SOPN: PEN_INJECTOR | SUBCUTANEOUS | 0 refills | Status: DC

## 2022-07-29 ENCOUNTER — Ambulatory Visit (INDEPENDENT_AMBULATORY_CARE_PROVIDER_SITE_OTHER): Admitting: Obstetrics & Gynecology

## 2022-07-29 ENCOUNTER — Encounter: Payer: Self-pay | Admitting: Obstetrics & Gynecology

## 2022-07-29 ENCOUNTER — Other Ambulatory Visit (HOSPITAL_COMMUNITY)
Admission: RE | Admit: 2022-07-29 | Discharge: 2022-07-29 | Disposition: A | Discharge status: Home / Self Care | Source: Ambulatory Visit | Attending: Obstetrics & Gynecology | Admitting: Obstetrics & Gynecology

## 2022-07-29 VITALS — BP 108/80 | HR 81 | Ht 67.5 in | Wt 257.0 lb

## 2022-07-29 DIAGNOSIS — Z6839 Body mass index (BMI) 39.0-39.9, adult: Secondary | ICD-10-CM | POA: Diagnosis not present

## 2022-07-29 DIAGNOSIS — Z780 Asymptomatic menopausal state: Secondary | ICD-10-CM | POA: Diagnosis not present

## 2022-07-29 DIAGNOSIS — Z90711 Acquired absence of uterus with remaining cervical stump: Secondary | ICD-10-CM

## 2022-07-29 DIAGNOSIS — Z01419 Encounter for gynecological examination (general) (routine) without abnormal findings: Secondary | ICD-10-CM | POA: Diagnosis not present

## 2022-07-29 NOTE — Progress Notes (Signed)
Rachel Simmons 1965/12/14 [id number]   History:    56 y.o. G2P2L2 Widowed x 11 years[place]  Son and daughter 26-27 yo.  RP:  Established patient presenting for annual gyn exam   HPI: Postmenopause, well on no HRT.  S/P Supracervical hysterectomy.  No PMB.  No Pelvic pain.  Abstinent.  Pap Neg in 09/2018.  Pap reflex today.  Had a Rt Bartholin gland cyst drained by me in 05/2022, no recurrence.  Breasts normal.  Mammo 04/2022, Lt Dx mammo/US Neg in 05/2022.  Cologuard Neg 2022, recommend Colonoscopy.  BMI 39.66.  Health labs with [facility].   Past medical history,surgical history, family history and social history were all reviewed and documented in the EPIC chart.  Gynecologic History No LMP recorded. Patient has had a hysterectomy.  Obstetric History OB History  Gravida Para Term Preterm AB Living  '2 2 2     2  '$ SAB IAB Ectopic Multiple Live Births               # Outcome Date GA Lbr Len/2nd Weight Sex Delivery Anes PTL Lv  2 Term           1 Term              ROS: A ROS was performed and pertinent positives and negatives are included in the history. GENERAL: No fevers or chills. HEENT: No change in vision, no earache, sore throat or sinus congestion. NECK: No pain or stiffness. CARDIOVASCULAR: No chest pain or pressure. No palpitations. PULMONARY: No shortness of breath, cough or wheeze. GASTROINTESTINAL: No abdominal pain, nausea, vomiting or diarrhea, melena or bright red blood per rectum. GENITOURINARY: No urinary frequency, urgency, hesitancy or dysuria. MUSCULOSKELETAL: No joint or muscle pain, no back pain, no recent trauma. DERMATOLOGIC: No rash, no itching, no lesions. ENDOCRINE: No polyuria, polydipsia, no heat or cold intolerance. No recent change in weight. HEMATOLOGICAL: No anemia or easy bruising or bleeding. NEUROLOGIC: No headache, seizures, numbness, tingling or weakness. PSYCHIATRIC: No depression, no loss of interest in normal activity or change in sleep  pattern.     Exam:   BP 108/80   Pulse 81   Ht 5' 7.5" (1.715 m)   Wt 257 lb (116.6 kg)   SpO2 97%   BMI 39.66 kg/m   Body mass index is 39.66 kg/m.  General appearance : Well developed well nourished female. No acute distress HEENT: Eyes: no retinal hemorrhage or exudates,  Neck supple, trachea midline, no carotid bruits, no thyroidmegaly Lungs: Clear to auscultation, no rhonchi or wheezes, or rib retractions  Heart: Regular rate and rhythm, no murmurs or gallops Breast:Examined in sitting and supine position were symmetrical in appearance, no palpable masses or tenderness,  no skin retraction, no nipple inversion, no nipple discharge, no skin discoloration, no axillary or supraclavicular lymphadenopathy Abdomen: no palpable masses or tenderness, no rebound or guarding Extremities: no edema or skin discoloration or tenderness  Pelvic: Vulva: Normal             Vagina: No gross lesions or discharge  Cervix: No gross lesions or discharge.  Pap reflex done  Uterus Absent (Supracervical Hysterectomy)  Adnexa  Without masses or tenderness  Anus: Normal   Assessment/Plan:  56 y.o. female for annual exam   1. Encounter for routine gynecological examination with Papanicolaou smear of cervix Postmenopause, well on no HRT.  S/P Supracervical hysterectomy.  No PMB.  No Pelvic pain.  Abstinent.  Pap Neg in 09/2018.  Pap reflex today.  Had a Rt Bartholin gland cyst drained by me in 05/2022, no recurrence.  Breasts normal.  Mammo 04/2022, Lt Dx mammo/US Neg in 05/2022.  Cologuard Neg 2022, recommend Colonoscopy.  BMI 39.66.  Health labs with [facility]. - Cytology - PAP( [place])  2. S/P abdominal supracervical subtotal hysterectomy  3. Postmenopause Postmenopause, well on no HRT.  S/P Supracervical hysterectomy.  No PMB.  No Pelvic pain.  Abstinent.  Vit D, Ca++ total 1.5 g/d, regular weight bearing activities.  4. Class 2 severe obesity due to excess calories with serious comorbidity  and body mass index (BMI) of 39.0 to 39.9 in adult [name]) Lower calorie/carb diet.  Fitness activities.  Other orders - Turmeric (QC TUMERIC COMPLEX PO); Take by mouth.   [name] MD, 8:45 AM 07/29/2022

## 2022-08-03 LAB — CYTOLOGY - PAP: Diagnosis: NEGATIVE

## 2022-08-17 ENCOUNTER — Other Ambulatory Visit: Payer: Self-pay | Admitting: Nurse Practitioner

## 2022-08-17 ENCOUNTER — Encounter: Payer: Self-pay | Admitting: Physician Assistant

## 2022-08-17 DIAGNOSIS — U071 Other specified respiratory disorders: Secondary | ICD-10-CM

## 2022-08-17 MED ORDER — NIRMATRELVIR/RITONAVIR (PAXLOVID)TABLET: 3.0000 | ORAL_TABLET | Freq: Two times a day (BID) | ORAL | 0 refills | Status: AC

## 2022-08-28 ENCOUNTER — Encounter: Payer: Self-pay | Admitting: Physician Assistant

## 2022-08-28 ENCOUNTER — Other Ambulatory Visit: Payer: Self-pay | Admitting: Physician Assistant

## 2022-08-28 DIAGNOSIS — E1169 Type 2 diabetes mellitus with other specified complication: Secondary | ICD-10-CM

## 2022-08-29 ENCOUNTER — Encounter: Payer: Self-pay | Admitting: Physician Assistant

## 2022-08-29 ENCOUNTER — Other Ambulatory Visit: Payer: Self-pay

## 2022-08-29 DIAGNOSIS — E038 Other specified hypothyroidism: Secondary | ICD-10-CM

## 2022-08-29 DIAGNOSIS — I152 Hypertension secondary to endocrine disorders: Secondary | ICD-10-CM

## 2022-08-29 MED ORDER — OZEMPIC (0.25 OR 0.5 MG/DOSE) 2 MG/1.5ML ~~LOC~~ SOPN: 0.5000 mg | PEN_INJECTOR | SUBCUTANEOUS | 1 refills | Status: DC

## 2022-08-29 MED ORDER — LEVOTHYROXINE SODIUM 112 MCG PO TABS: ORAL_TABLET | ORAL | 3 refills | Status: DC

## 2022-08-29 MED ORDER — LISINOPRIL-HYDROCHLOROTHIAZIDE 20-25 MG PO TABS: 1.0000 | ORAL_TABLET | Freq: Every day | ORAL | 1 refills | Status: DC

## 2022-09-24 ENCOUNTER — Encounter: Payer: Self-pay | Admitting: Physician Assistant

## 2022-09-26 ENCOUNTER — Encounter: Payer: Self-pay | Admitting: Physician Assistant

## 2022-09-26 ENCOUNTER — Other Ambulatory Visit: Payer: Self-pay

## 2022-09-26 DIAGNOSIS — E1169 Type 2 diabetes mellitus with other specified complication: Secondary | ICD-10-CM

## 2022-09-26 MED ORDER — FREESTYLE LITE TEST VI STRP: ORAL_STRIP | 3 refills | Status: DC

## 2022-10-21 ENCOUNTER — Encounter: Payer: Self-pay | Admitting: Physician Assistant

## 2022-10-21 ENCOUNTER — Ambulatory Visit: Admitting: Physician Assistant

## 2022-10-21 VITALS — BP 120/80 | HR 63 | Resp 18 | Ht 69.0 in | Wt 258.0 lb

## 2022-10-21 DIAGNOSIS — F4323 Adjustment disorder with mixed anxiety and depressed mood: Secondary | ICD-10-CM

## 2022-10-21 DIAGNOSIS — Z23 Encounter for immunization: Secondary | ICD-10-CM | POA: Diagnosis not present

## 2022-10-21 DIAGNOSIS — E785 Hyperlipidemia, unspecified: Secondary | ICD-10-CM

## 2022-10-21 DIAGNOSIS — E1169 Type 2 diabetes mellitus with other specified complication: Secondary | ICD-10-CM | POA: Diagnosis not present

## 2022-10-21 DIAGNOSIS — I152 Hypertension secondary to endocrine disorders: Secondary | ICD-10-CM

## 2022-10-21 DIAGNOSIS — E038 Other specified hypothyroidism: Secondary | ICD-10-CM

## 2022-10-21 DIAGNOSIS — E1159 Type 2 diabetes mellitus with other circulatory complications: Secondary | ICD-10-CM | POA: Diagnosis not present

## 2022-10-21 DIAGNOSIS — E559 Vitamin D deficiency, unspecified: Secondary | ICD-10-CM

## 2022-10-21 LAB — POCT GLYCOSYLATED HEMOGLOBIN (HGB A1C): HbA1c POC (<> result, manual entry): 6.2 % (ref 4.0–5.6)

## 2022-10-21 MED ORDER — FLUOXETINE HCL 40 MG PO CAPS: 40.0000 mg | ORAL_CAPSULE | Freq: Every day | ORAL | 0 refills | Status: DC

## 2022-10-21 MED ORDER — OZEMPIC (0.25 OR 0.5 MG/DOSE) 2 MG/1.5ML ~~LOC~~ SOPN: 0.5000 mg | PEN_INJECTOR | SUBCUTANEOUS | 1 refills | Status: DC

## 2022-10-21 NOTE — Progress Notes (Signed)
Established patient visit   Patient: Rachel Simmons   DOB: 06/08/1966   56 y.o. Female  MRN: [id number] Visit Date: 10/21/2022  Chief Complaint  Patient presents with   Follow-up    Fasting   Diabetes   Subjective    HPI HPI     Follow-up    Additional comments: Fasting      Last edited by Gemma Payor, CMA on 10/21/2022  9:59 AM.      Patient presenting for chronic follow-up visit.  Diabetes: No increased urination or thirst. Pt reports medication compliance with Ozempic 0.5 mg once a week. One hypoglycemic event which improved after eating. Checking glucose at home. FBS average 100. Reports has reduce carbs and sugars.  HTN: No chest pain, palpitations, dizziness or headache. Taking medication as directed without side effects. Pt follows a low salt diet.  HLD: Pt taking medication as directed without issues.   Mood: Reports medication compliance. States has noticed feeling "off" from her usual. Does report personal stress. Patient has been on Prozac 20 mg for many years. No SI/HI.     10/21/2022   10:53 AM 06/24/2022    8:53 AM 05/11/2022    2:48 PM 02/18/2022    8:21 AM 11/17/2021    4:31 PM  Depression screen PHQ 2/9  Decreased Interest 1 0 0 0 0  Down, Depressed, Hopeless 1 0 0 0 0  PHQ - 2 Score 2 0 0 0 0  Altered sleeping 1 0  0   Tired, decreased energy 1 0  0 0  Change in appetite  0  0 0  Feeling bad or failure about yourself  0 0  0 0  Trouble concentrating 1 0  0 0  Moving slowly or fidgety/restless 0 0  0 0  Suicidal thoughts 0 0  0 0  PHQ-9 Score 5 0  0   Difficult doing work/chores Not difficult at all Not difficult at all  Not difficult at all       10/21/2022   10:53 AM 06/24/2022    8:54 AM 02/18/2022    8:21 AM 11/17/2021    4:31 PM  GAD 7 : Generalized Anxiety Score  Nervous, Anxious, on Edge 1 0 0 0  Control/stop worrying 0 0 0 0  Worry too much - different things 0 0 0 0  Trouble relaxing 0 0 0 0  Restless 1 0 0 0  Easily  annoyed or irritable 1 0 0 0  Afraid - awful might happen 0 0 0 0  Total GAD 7 Score 3 0 0 0  Anxiety Difficulty Not difficult at all Not difficult at all Not difficult at all       Medications: Outpatient Medications Prior to Visit  Medication Sig   ASPIRIN 81 PO Take by mouth.   atorvastatin (LIPITOR) 10 MG tablet Take 0.5 tablets (5 mg total) by mouth at bedtime.   cetirizine (ZYRTEC) 5 MG tablet Take 1 tablet by mouth daily.   Ferrous Sulfate (IRON PO) Take by mouth 2 (two) times daily.   glucose blood (FREESTYLE LITE) test strip USE TO CHECK FASTING BLOOD SUGAR AND 2 HOURS AFTER EVERY MEAL.   levothyroxine (SYNTHROID) 112 MCG tablet TAKE 1 TABLET DAILY BEFORE BREAKFAST (NEED APPOINTMENT FOR REFILLS)   lisinopril-hydrochlorothiazide (ZESTORETIC) 20-25 MG tablet Take 1 tablet by mouth daily.   Multiple Vitamins-Minerals (ONE-A-DAY WOMENS 50 PLUS PO) Take 1 tablet daily by mouth.   Vitamin D, Ergocalciferol, (DRISDOL) 1.25  MG (50000 UNIT) CAPS capsule TAKE 1 CAPSULE EVERY 7 DAYS   [DISCONTINUED] FLUoxetine (PROZAC) 20 MG capsule TAKE 1 CAPSULE DAILY   [DISCONTINUED] Semaglutide,0.25 or 0.5MG/DOS, (OZEMPIC, 0.25 OR 0.5 MG/DOSE,) 2 MG/1.5ML SOPN Inject 0.5 mg into the skin once a week. Inject 0.25 mg into skin once a week x 4 weeks. Then inject 0.5 mg into skin once a week.   Omega-3 Fatty Acids (FISH OIL) 1200 MG CAPS Take 4-5 tabs daily   Turmeric (QC TUMERIC COMPLEX PO) Take by mouth.   No facility-administered medications prior to visit.    Review of Systems Review of Systems:  A fourteen system review of systems was performed and found to be positive as per HPI.  Last CBC Lab Results  Component Value Date   WBC 12.6 (H) 06/24/2022   HGB 12.3 06/24/2022   HCT 39.5 06/24/2022   MCV 86 06/24/2022   MCH 26.7 06/24/2022   RDW 14.5 06/24/2022   PLT 262 03/50/0938   Last metabolic panel Lab Results  Component Value Date   GLUCOSE 101 (H) 06/24/2022   NA 138 06/24/2022    K 4.2 06/24/2022   CL 101 06/24/2022   CO2 27 06/24/2022   BUN 10 06/24/2022   CREATININE 0.59 06/24/2022   EGFR 107 06/24/2022   CALCIUM 10.3 (H) 06/24/2022   PHOS 3.9 08/09/2017   PROT 6.5 06/24/2022   ALBUMIN 4.2 06/24/2022   LABGLOB 2.3 06/24/2022   AGRATIO 1.8 06/24/2022   BILITOT 0.3 06/24/2022   ALKPHOS 75 06/24/2022   AST 22 06/24/2022   ALT 23 06/24/2022   Last lipids Lab Results  Component Value Date   CHOL 164 06/24/2022   HDL 53 06/24/2022   LDLCALC 84 06/24/2022   TRIG 154 (H) 06/24/2022   CHOLHDL 3.1 06/24/2022   Last hemoglobin A1c Lab Results  Component Value Date   HGBA1C 6.2 10/21/2022   Last thyroid functions Lab Results  Component Value Date   TSH 1.860 04/15/2022   T3TOTAL 128 08/19/2020   Last vitamin D Lab Results  Component Value Date   VD25OH 48.0 02/18/2022     Objective    BP 120/80 (BP Location: Left Arm, Patient Position: Sitting, Cuff Size: Large)   Pulse 63   Resp 18   Ht _0  (1.753 m)   Wt 258 lb (117 kg)   SpO2 100%   BMI 38.10 kg/m  BP Readings from Last 3 Encounters:  10/21/22 120/80  07/29/22 108/80  06/24/22 116/80   Wt Readings from Last 3 Encounters:  10/21/22 258 lb (117 kg)  07/29/22 257 lb (116.6 kg)  06/24/22 261 lb (118.4 kg)    Physical Exam  General:  Pleasant and cooperative, appropriate for stated age.  Neuro:  Alert and oriented,  extra-ocular muscles intact  HEENT:  Normocephalic, atraumatic, neck supple  Skin:  no gross rash, warm, pink. Cardiac:  RRR, S1 S2 Respiratory: CTA B/L  Vascular:  Ext warm, no cyanosis apprec.; cap RF less 2 sec. No edema. Psych:  No HI/SI, judgement and insight good, Euthymic mood. Full Affect.   Results for orders placed or performed in visit on 10/21/22  POCT HgB A1C  Result Value Ref Range   Hemoglobin A1C     HbA1c POC (<> result, manual entry) 6.2 4.0 - 5.6 %   HbA1c, POC (prediabetic range)     HbA1c, POC (controlled diabetic range)      Assessment &  Plan      Problem  List Items Addressed This Visit       Cardiovascular and Mediastinum   Hypertension associated with diabetes ([place]) (Chronic)    -Stable. Continue Lisinopril-HCTZ 20-25 mg daily. Will collect CMP to monitor renal function and electrolytes.      Relevant Medications   Semaglutide,0.25 or 0.5MG/DOS, (OZEMPIC, 0.25 OR 0.5 MG/DOSE,) 2 MG/1.5ML SOPN   Other Relevant Orders   CBC with Differential/Platelet   Comprehensive metabolic panel     Endocrine   Hyperlipidemia associated with type 2 diabetes mellitus (HCC) (Chronic)    -Last lipid panel: HDL 53, LDL 84 (goal<70). Will repeat lipid panel and hepatic function today. Continue atorvastatin 10 mg 0.5 tab at bedtime, if LDL remains consistently above goal then recommend taking full tablet of atorvastatin 10 mg if tolerable.       Relevant Medications   Semaglutide,0.25 or 0.5MG/DOS, (OZEMPIC, 0.25 OR 0.5 MG/DOSE,) 2 MG/1.5ML SOPN   Other Relevant Orders   Lipid panel   CBC with Differential/Platelet   Hypothyroidism    -Last TSH wnl at 1.860. -Continue current medication regimen. -Rechecking thyroid labs today. Pending results will make medication adjustments if indicated.       Relevant Orders   TSH   T4, free   Diabetes mellitus ([place]) - Primary    -A1c has improved from 6.4 to 6.2, at goal <7.0. Recommend to continue Ozempic 0.5 mg once a week. Dicussed management of hypoglycemia and preventative strategies. Continue ambulatory glucose monitoring. Continue with a diabetic diet.      Relevant Medications   Semaglutide,0.25 or 0.5MG/DOS, (OZEMPIC, 0.25 OR 0.5 MG/DOSE,) 2 MG/1.5ML SOPN   Other Relevant Orders   POCT HgB A1C (Completed)     Other   Adjustment disorder with mixed anxiety and depressed mood (Chronic)    -PHQ-9 and GAD-7 scores higher than baseline. Discussed with patient increasing fluoxetine to 40 mg daily, patient is agreeable. Discussed if unable to tolerate increased dose then recommend  resuming 20 mg or consider changing to another SSRI. Will trial 30 day supply.       Relevant Medications   FLUoxetine (PROZAC) 40 MG capsule   Vitamin D deficiency    -Last Vitamin D 48.0, will repeat Vitamin D today. Pending results recommend to continue with vitamin D 50,000 units once a week or adjust treatment plan.       Relevant Orders   Vitamin D (25 hydroxy)   Other Visit Diagnoses     Need for influenza vaccination       Relevant Orders   Flu Vaccine QUAD 6+ mos PF IM (Fluarix Quad PF) (Completed)       Return in about 4 months (around 02/19/2023) for DM, HTN, HLD.        Lorrene Reid, PA-C  [facility] at [facility] [phone] (phone) [phone] (fax)  [place]

## 2022-10-21 NOTE — Assessment & Plan Note (Addendum)
-  PHQ-9 and GAD-7 scores higher than baseline. Discussed with patient increasing fluoxetine to 40 mg daily, patient is agreeable. Discussed if unable to tolerate increased dose then recommend resuming 20 mg or consider changing to another SSRI. Will trial 30 day supply.

## 2022-10-21 NOTE — Assessment & Plan Note (Signed)
>>  ASSESSMENT AND PLAN FOR DIABETES MELLITUS WRITTEN ON 10/21/2022  1:17 PM BY ABONZA, MARITZA, PA-C  -A1c has improved from 6.4 to 6.2, at goal <7.0. Recommend to continue Ozempic 0.5 mg once a week. Dicussed management of hypoglycemia and preventative strategies. Continue ambulatory glucose monitoring. Continue with a diabetic diet.

## 2022-10-21 NOTE — Assessment & Plan Note (Signed)
-  Last TSH wnl at 1.860. -Continue current medication regimen. -Rechecking thyroid labs today. Pending results will make medication adjustments if indicated.

## 2022-10-21 NOTE — Assessment & Plan Note (Signed)
-  Last lipid panel: HDL 53, LDL 84 (goal<70). Will repeat lipid panel and hepatic function today. Continue atorvastatin 10 mg 0.5 tab at bedtime, if LDL remains consistently above goal then recommend taking full tablet of atorvastatin 10 mg if tolerable.

## 2022-10-21 NOTE — Assessment & Plan Note (Signed)
-  Last Vitamin D 48.0, will repeat Vitamin D today. Pending results recommend to continue with vitamin D 50,000 units once a week or adjust treatment plan.

## 2022-10-21 NOTE — Assessment & Plan Note (Signed)
-  Stable. Continue Lisinopril-HCTZ 20-25 mg daily. Will collect CMP to monitor renal function and electrolytes.

## 2022-10-21 NOTE — Assessment & Plan Note (Signed)
-  A1c has improved from 6.4 to 6.2, at goal <7.0. Recommend to continue Ozempic 0.5 mg once a week. Dicussed management of hypoglycemia and preventative strategies. Continue ambulatory glucose monitoring. Continue with a diabetic diet.

## 2022-10-21 NOTE — Patient Instructions (Signed)
Managing Stress, Adult Feeling a certain amount of stress is normal. Stress helps our body and mind get ready to deal with the demands of life. Stress hormones can motivate you to do well at work and meet your responsibilities. But severe or long-term (chronic) stress can affect your mental and physical health. Chronic stress puts you at higher risk for: Anxiety and depression. Other health problems such as digestive problems, muscle aches, heart disease, high blood pressure, and stroke. What are the causes? Common causes of stress include: Demands from work, such as deadlines, feeling overworked, or having long hours. Pressures at home, such as money issues, disagreements with a spouse, or parenting issues. Pressures from major life changes, such as divorce, moving, loss of a loved one, or chronic illness. You may be at higher risk for stress-related problems if you: Do not get enough sleep. Are in poor health. Do not have emotional support. Have a mental health disorder such as anxiety or depression. How to recognize stress Stress can make you: Have trouble sleeping. Feel sad, anxious, irritable, or overwhelmed. Lose your appetite. Overeat or want to eat unhealthy foods. Want to use drugs or alcohol. Stress can also cause physical symptoms, such as: Sore, tense muscles, especially in the shoulders and neck. Headaches. Trouble breathing. A faster heart rate. Stomach pain, nausea, or vomiting. Diarrhea or constipation. Trouble concentrating. Follow these instructions at home: Eating and drinking Eat a healthy diet. This includes: Eating foods that are high in fiber, such as beans, whole grains, and fresh fruits and vegetables. Limiting foods that are high in fat and processed sugars, such as fried or sweet foods. Do not skip meals or overeat. Drink enough fluid to keep your urine pale yellow. Alcohol use Do not drink alcohol if: Your health care provider tells you not to  drink. You are pregnant, may be pregnant, or are planning to become pregnant. Drinking alcohol is a way some people try to ease their stress. This can be dangerous, so if you drink alcohol: Limit how much you have to: 0-1 drink a day for women. 0-2 drinks a day for men. Know how much alcohol is in your drink. In the [place] one drink equals one 12 oz bottle of beer (355 mL), one 5 oz glass of wine (148 mL), or one 1 oz glass of hard liquor (44 mL). Activity  Include 30 minutes of exercise in your daily schedule. Exercise is a good stress reducer. Include time in your day for an activity that you find relaxing. Try taking a walk, going on a bike ride, reading a book, or listening to music. Schedule your time in a way that lowers stress, and keep a regular schedule. Focus on doing what is most important to get done. Lifestyle Identify the source of your stress and your reaction to it. See a therapist who can help you change unhelpful reactions. When there are stressful events: Talk about them with family, friends, or coworkers. Try to think realistically about stressful events and not ignore them or overreact. Try to find the positives in a stressful situation and not focus on the negatives. Cut back on responsibilities at work and home, if possible. Ask for help from friends or family members if you need it. Find ways to manage stress, such as: Mindfulness, meditation, or deep breathing. Yoga or tai chi. Progressive muscle relaxation. Spending time in nature. Doing art, playing music, or reading. Making time for fun activities. Spending time with family and friends. Get support  from family, friends, or spiritual resources. General instructions Get enough sleep. Try to go to sleep and get up at about the same time every day. Take over-the-counter and prescription medicines only as told by your health care provider. Do not use any products that contain nicotine or tobacco. These products  include cigarettes, chewing tobacco, and vaping devices, such as e-cigarettes. If you need help quitting, ask your health care provider. Do not use drugs or smoke to deal with stress. Keep all follow-up visits. This is important. Where to find support Talk with your health care provider about stress management or finding a support group. Find a therapist to work with you on your stress management techniques. Where to find more information [employer] on Mental Illness: www.nami.org American Psychological Association: TVStereos.ch Contact a health care provider if: Your stress symptoms get worse. You are unable to manage your stress at home. You are struggling to stop using drugs or alcohol. Get help right away if: You may be a danger to yourself or others. You have any thoughts of death or suicide. Get help right awayif you feel like you may hurt yourself or others, or have thoughts about taking your own life. Go to your nearest emergency room or: Call 911. Call the [facility] at 289-837-1647 or 988 in the [place].. This is open 24 hours a day. Text the Crisis Text Line at (726)508-3618. Summary Feeling a certain amount of stress is normal, but severe or long-term (chronic) stress can affect your mental and physical health. Chronic stress can put you at higher risk for anxiety, depression, and other health problems such as digestive problems, muscle aches, heart disease, high blood pressure, and stroke. You may be at higher risk for stress-related problems if you do not get enough sleep, are in poor health, lack emotional support, or have a mental health disorder such as anxiety or depression. Identify the source of your stress and your reaction to it. Try talking about stressful events with family, friends, or coworkers, finding a coping method, or getting support from spiritual resources. If you need more help, talk with your health care provider about finding a  support group or a mental health therapist. This information is not intended to replace advice given to you by your health care provider. Make sure you discuss any questions you have with your health care provider. Document Revised: 06/17/2021 Document Reviewed: 06/15/2021 Elsevier Patient Education  [place].

## 2022-10-22 LAB — CBC WITH DIFFERENTIAL/PLATELET
Basophils Absolute: 0.1 10*3/uL (ref 0.0–0.2)
Basos: 1 %
EOS (ABSOLUTE): 0.2 10*3/uL (ref 0.0–0.4)
Eos: 1 %
Hematocrit: 41.1 % (ref 34.0–46.6)
Hemoglobin: 13.3 g/dL (ref 11.1–15.9)
Immature Grans (Abs): 0 10*3/uL (ref 0.0–0.1)
Immature Granulocytes: 0 %
Lymphocytes Absolute: 12.5 10*3/uL — ABNORMAL HIGH (ref 0.7–3.1)
Lymphs: 74 %
MCH: 27.8 pg (ref 26.6–33.0)
MCHC: 32.4 g/dL (ref 31.5–35.7)
MCV: 86 fL (ref 79–97)
Monocytes Absolute: 0.6 10*3/uL (ref 0.1–0.9)
Monocytes: 4 %
Neutrophils Absolute: 3.5 10*3/uL (ref 1.4–7.0)
Neutrophils: 20 %
Platelets: 240 10*3/uL (ref 150–450)
RBC: 4.79 x10E6/uL (ref 3.77–5.28)
RDW: 13.8 % (ref 11.7–15.4)
WBC: 16.9 10*3/uL — ABNORMAL HIGH (ref 3.4–10.8)

## 2022-10-22 LAB — COMPREHENSIVE METABOLIC PANEL
ALT: 27 IU/L (ref 0–32)
AST: 23 IU/L (ref 0–40)
Albumin/Globulin Ratio: 1.5 (ref 1.2–2.2)
Albumin: 4 g/dL (ref 3.8–4.9)
Alkaline Phosphatase: 73 IU/L (ref 44–121)
BUN/Creatinine Ratio: 20 (ref 9–23)
BUN: 11 mg/dL (ref 6–24)
Bilirubin Total: 0.4 mg/dL (ref 0.0–1.2)
CO2: 28 mmol/L (ref 20–29)
Calcium: 10.3 mg/dL — ABNORMAL HIGH (ref 8.7–10.2)
Chloride: 103 mmol/L (ref 96–106)
Creatinine, Ser: 0.55 mg/dL — ABNORMAL LOW (ref 0.57–1.00)
Globulin, Total: 2.6 g/dL (ref 1.5–4.5)
Glucose: 107 mg/dL — ABNORMAL HIGH (ref 70–99)
Potassium: 4.3 mmol/L (ref 3.5–5.2)
Sodium: 139 mmol/L (ref 134–144)
Total Protein: 6.6 g/dL (ref 6.0–8.5)
eGFR: 108 mL/min/{1.73_m2} (ref >59)

## 2022-10-22 LAB — TSH: TSH: 1.73 u[IU]/mL (ref 0.450–4.500)

## 2022-10-22 LAB — LIPID PANEL
Chol/HDL Ratio: 2.2 ratio (ref 0.0–4.4)
Cholesterol, Total: 129 mg/dL (ref 100–199)
HDL: 58 mg/dL (ref >39)
LDL Chol Calc (NIH): 53 mg/dL (ref 0–99)
Triglycerides: 98 mg/dL (ref 0–149)
VLDL Cholesterol Cal: 18 mg/dL (ref 5–40)

## 2022-10-22 LAB — T4, FREE: Free T4: 1.35 ng/dL (ref 0.82–1.77)

## 2022-10-22 LAB — VITAMIN D 25 HYDROXY (VIT D DEFICIENCY, FRACTURES): Vit D, 25-Hydroxy: 35 ng/mL (ref 30.0–100.0)

## 2022-11-03 ENCOUNTER — Other Ambulatory Visit: Payer: Self-pay | Admitting: Nurse Practitioner

## 2022-11-03 DIAGNOSIS — E1169 Type 2 diabetes mellitus with other specified complication: Secondary | ICD-10-CM

## 2022-11-07 ENCOUNTER — Other Ambulatory Visit

## 2022-11-07 ENCOUNTER — Ambulatory Visit: Admitting: Oncology

## 2022-11-08 ENCOUNTER — Encounter: Payer: Self-pay | Admitting: Nurse Practitioner

## 2022-11-08 DIAGNOSIS — F4323 Adjustment disorder with mixed anxiety and depressed mood: Secondary | ICD-10-CM

## 2022-11-08 DIAGNOSIS — I152 Hypertension secondary to endocrine disorders: Secondary | ICD-10-CM

## 2022-11-08 DIAGNOSIS — E038 Other specified hypothyroidism: Secondary | ICD-10-CM

## 2022-11-08 DIAGNOSIS — E1169 Type 2 diabetes mellitus with other specified complication: Secondary | ICD-10-CM

## 2022-11-10 MED ORDER — OZEMPIC (0.25 OR 0.5 MG/DOSE) 2 MG/1.5ML ~~LOC~~ SOPN: 0.5000 mg | PEN_INJECTOR | SUBCUTANEOUS | 1 refills | Status: DC

## 2022-11-17 NOTE — Progress Notes (Signed)
[facility]  [address] [place],  [place]  [address] [phone]  Clinic Day:  11/18/2022  Referring physician: Lorrene Reid, PA-C  HISTORY OF PRESENT ILLNESS:  Rachel Simmons is a 56 y.o. female with chronic lymphocytic leukemia, which was diagnosed per flow cytometry of her peripheral blood in June 2016.  As her disease has behaved very indolently, it has been followed conservatively, without any particular intervention.  Recent labs also showed her with mild iron deficiency anemia.  She comes in today for routine follow up.  Since her last visit, the patient has been doing well.  She has been taking 2 iron pills daily.  She denies having any overt forms of blood loss.  She also denies having any B symptoms or bulky lymphadenopathy which concerns her for catabolic progression of her CLL.    VITALS:  Blood pressure (!) 139/95, pulse 63, temperature 98.3 F (36.8 C), resp. rate 16, height '5\' 9"'$  (1.753 m), weight 260 lb 12.8 oz (118.3 kg), SpO2 99 %.  Wt Readings from Last 3 Encounters:  11/18/22 260 lb 12.8 oz (118.3 kg)  10/21/22 258 lb (117 kg)  07/29/22 257 lb (116.6 kg)    Body mass index is 38.51 kg/m.  Performance status (ECOG): 0 - Asymptomatic  PHYSICAL EXAM:  Physical Exam Constitutional:      General: She is not in acute distress.    Appearance: Normal appearance. She is normal weight.  HENT:     Head: Normocephalic and atraumatic.  Eyes:     General: No scleral icterus.    Extraocular Movements: Extraocular movements intact.     Conjunctiva/sclera: Conjunctivae normal.     Pupils: Pupils are equal, round, and reactive to light.  Cardiovascular:     Rate and Rhythm: Normal rate and regular rhythm.     Pulses: Normal pulses.     Heart sounds: Normal heart sounds. No murmur heard.    No friction rub. No gallop.  Pulmonary:     Effort: Pulmonary effort is normal. No respiratory distress.     Breath sounds: Normal breath  sounds.  Abdominal:     General: Bowel sounds are normal. There is no distension.     Palpations: Abdomen is soft. There is no hepatomegaly, splenomegaly or mass.     Tenderness: There is no abdominal tenderness.  Musculoskeletal:        General: Normal range of motion.     Cervical back: Normal range of motion and neck supple.     Right lower leg: No edema.     Left lower leg: No edema.  Lymphadenopathy:     Cervical: No cervical adenopathy.  Skin:    General: Skin is warm and dry.  Neurological:     General: No focal deficit present.     Mental Status: She is alert and oriented to person, place, and time. Mental status is at baseline.  Psychiatric:        Mood and Affect: Mood normal.        Behavior: Behavior normal.        Thought Content: Thought content normal.        Judgment: Judgment normal.    LABS:     Latest Reference Range & Units 11/18/22 09:05  Iron 28 - 170 ug/dL 127  UIBC ug/dL 240  TIBC 250 - 450 ug/dL 367  Saturation Ratios 10.4 - 31.8 % 35 (H)  Ferritin 11 - 307 ng/mL 30  (H):  Data is abnormally high  ASSESSMENT & PLAN:  A 56 year old woman with chronic lymphocytic leukemia.  There remains nothing per her labs or physical exam today which suggests her disease is progressing to where intervention is needed. I am also pleased as her hemoglobin is better today, likely from her taking iron.  I have no problem with her decreasing her oral iron to 1 pill daily.   Overall, the patient continues to do very well.  I will see her back in 6 months for repeat clinical assessment.  The patient understands all the plans discussed today and is in agreement with them.  Kaliah Haddaway Macarthur Critchley, MD

## 2022-11-18 ENCOUNTER — Inpatient Hospital Stay: Admitting: Oncology

## 2022-11-18 ENCOUNTER — Inpatient Hospital Stay: Attending: Oncology

## 2022-11-18 VITALS — BP 139/95 | HR 63 | Temp 98.3°F | Resp 16 | Ht 69.0 in | Wt 260.8 lb

## 2022-11-18 DIAGNOSIS — C9110 Chronic lymphocytic leukemia of B-cell type not having achieved remission: Secondary | ICD-10-CM

## 2022-11-18 DIAGNOSIS — D509 Iron deficiency anemia, unspecified: Secondary | ICD-10-CM | POA: Diagnosis present

## 2022-11-18 DIAGNOSIS — D649 Anemia, unspecified: Secondary | ICD-10-CM

## 2022-11-18 LAB — IRON AND TIBC
Iron: 127 ug/dL (ref 28–170)
Saturation Ratios: 35 % — ABNORMAL HIGH (ref 10.4–31.8)
TIBC: 367 ug/dL (ref 250–450)
UIBC: 240 ug/dL

## 2022-11-18 LAB — CBC: RBC: 4.5 (ref 3.87–5.11)

## 2022-11-18 LAB — FERRITIN: Ferritin: 30 ng/mL (ref 11–307)

## 2022-11-18 LAB — CBC AND DIFFERENTIAL
HCT: 39 (ref 36–46)
Hemoglobin: 13 (ref 12.0–16.0)
Neutrophils Absolute: 4.65
Platelets: 232 10*3/uL (ref 150–400)
WBC: 15.5

## 2022-11-22 MED ORDER — OZEMPIC (0.25 OR 0.5 MG/DOSE) 2 MG/1.5ML ~~LOC~~ SOPN: 0.5000 mg | PEN_INJECTOR | SUBCUTANEOUS | 1 refills | Status: DC

## 2022-11-22 MED ORDER — OZEMPIC (0.25 OR 0.5 MG/DOSE) 2 MG/1.5ML ~~LOC~~ SOPN: 0.5000 mg | PEN_INJECTOR | SUBCUTANEOUS | 0 refills | Status: DC

## 2022-11-22 MED ORDER — FLUOXETINE HCL 40 MG PO CAPS: 40.0000 mg | ORAL_CAPSULE | Freq: Every day | ORAL | 0 refills | Status: DC

## 2022-11-22 NOTE — Addendum Note (Signed)
Addended by: Gemma Payor on: 11/22/2022 02:00 PM   Modules accepted: Orders

## 2022-11-22 NOTE — Addendum Note (Signed)
Addended by: Gemma Payor on: 11/22/2022 08:22 AM   Modules accepted: Orders

## 2022-12-26 MED ORDER — ATORVASTATIN CALCIUM 10 MG PO TABS: 5.0000 mg | ORAL_TABLET | Freq: Every day | ORAL | 0 refills | Status: DC

## 2022-12-26 MED ORDER — LEVOTHYROXINE SODIUM 112 MCG PO TABS: ORAL_TABLET | ORAL | 0 refills | Status: DC

## 2022-12-26 MED ORDER — FLUOXETINE HCL 40 MG PO CAPS: 40.0000 mg | ORAL_CAPSULE | Freq: Every day | ORAL | 0 refills | Status: DC

## 2022-12-26 MED ORDER — LISINOPRIL-HYDROCHLOROTHIAZIDE 20-25 MG PO TABS: 1.0000 | ORAL_TABLET | Freq: Every day | ORAL | 0 refills | Status: DC

## 2022-12-26 NOTE — Addendum Note (Signed)
Addended by: Gemma Payor on: 12/26/2022 11:16 AM   Modules accepted: Orders

## 2023-02-02 ENCOUNTER — Other Ambulatory Visit: Payer: Self-pay | Admitting: Nurse Practitioner

## 2023-02-02 DIAGNOSIS — F4323 Adjustment disorder with mixed anxiety and depressed mood: Secondary | ICD-10-CM

## 2023-02-19 ENCOUNTER — Other Ambulatory Visit: Payer: Self-pay | Admitting: Nurse Practitioner

## 2023-02-19 DIAGNOSIS — F4323 Adjustment disorder with mixed anxiety and depressed mood: Secondary | ICD-10-CM

## 2023-02-19 DIAGNOSIS — E1169 Type 2 diabetes mellitus with other specified complication: Secondary | ICD-10-CM

## 2023-02-20 ENCOUNTER — Other Ambulatory Visit: Payer: Self-pay | Admitting: Family Medicine

## 2023-02-20 ENCOUNTER — Telehealth: Payer: Self-pay | Admitting: *Deleted

## 2023-02-20 DIAGNOSIS — F4323 Adjustment disorder with mixed anxiety and depressed mood: Secondary | ICD-10-CM

## 2023-02-20 DIAGNOSIS — E1169 Type 2 diabetes mellitus with other specified complication: Secondary | ICD-10-CM

## 2023-02-20 MED ORDER — ATORVASTATIN CALCIUM 10 MG PO TABS: 5.0000 mg | ORAL_TABLET | Freq: Every day | ORAL | 0 refills | Status: DC

## 2023-02-20 MED ORDER — FLUOXETINE HCL 60 MG PO TABS: 1.0000 | ORAL_TABLET | Freq: Every day | ORAL | 2 refills | Status: DC

## 2023-02-20 NOTE — Telephone Encounter (Signed)
Prescription for fluoxetine 60 mg sent to [facility], note to pharmacist that generic is preferred.

## 2023-02-20 NOTE — Telephone Encounter (Signed)
Pt calling stating that she needed to make an appointment for refills due to them being denied.  She stated that she needed the below medication, she will run out on Thursday.    She also mentioned on the Fluoxetine she has been taking a 40 and a 20, she  said Abonza and her had discussed it, she wanted to see if a 60 could be called in or if she should just discuss the change at her visit. Please send in enough medication until upcoming appointment.    FLUoxetine (PROZAC)   atorvastatin (LIPITOR)      [facility] - [place], [place] - [place] 10/21/2022 ROV 03/08/23

## 2023-02-20 NOTE — Telephone Encounter (Signed)
Atorvastatin has been sent to pharmacy. Patient requested a new script for Fluoxetine 60 mg be sent to [facility]. Currently it is at 40 mg.

## 2023-02-24 ENCOUNTER — Other Ambulatory Visit: Payer: Self-pay | Admitting: Nurse Practitioner

## 2023-02-24 DIAGNOSIS — E1169 Type 2 diabetes mellitus with other specified complication: Secondary | ICD-10-CM

## 2023-03-08 ENCOUNTER — Ambulatory Visit: Admitting: Family Medicine

## 2023-03-08 ENCOUNTER — Encounter: Payer: Self-pay | Admitting: Family Medicine

## 2023-03-08 VITALS — BP 133/84 | HR 88 | Resp 18 | Ht 69.0 in | Wt 264.0 lb

## 2023-03-08 DIAGNOSIS — I152 Hypertension secondary to endocrine disorders: Secondary | ICD-10-CM

## 2023-03-08 DIAGNOSIS — E785 Hyperlipidemia, unspecified: Secondary | ICD-10-CM | POA: Diagnosis not present

## 2023-03-08 DIAGNOSIS — E038 Other specified hypothyroidism: Secondary | ICD-10-CM | POA: Diagnosis not present

## 2023-03-08 DIAGNOSIS — E1159 Type 2 diabetes mellitus with other circulatory complications: Secondary | ICD-10-CM

## 2023-03-08 DIAGNOSIS — Z1159 Encounter for screening for other viral diseases: Secondary | ICD-10-CM | POA: Diagnosis not present

## 2023-03-08 DIAGNOSIS — E1169 Type 2 diabetes mellitus with other specified complication: Secondary | ICD-10-CM

## 2023-03-08 DIAGNOSIS — R591 Generalized enlarged lymph nodes: Secondary | ICD-10-CM

## 2023-03-08 LAB — POCT UA - MICROALBUMIN
Albumin/Creatinine Ratio, Urine, POC: <30
Creatinine, POC: 300 mg/dL
Microalbumin Ur, POC: 30 mg/L

## 2023-03-08 NOTE — Assessment & Plan Note (Signed)
Last lipid panel: LDL 53, HDL 58, triglycerides 98.  Meets goal of LDL less than 70.  Continue atorvastatin 10 mg daily.  Will recheck lipid panel with annual physical lab work.

## 2023-03-08 NOTE — Assessment & Plan Note (Signed)
BP slightly above goal upon presentation at 133/84.  Continue lisinopril-HCTZ 20-25 mg daily.  Collecting CMP today for renal function and electrolytes.  Will continue to monitor.

## 2023-03-08 NOTE — Assessment & Plan Note (Signed)
Last TSH within normal limits continue levothyroxine 112 mcg daily.  Will repeat thyroid labs TSH and T4 with annual physical.

## 2023-03-08 NOTE — Assessment & Plan Note (Signed)
Last A1c 6.4, repeating today.  Continue Ozempic 0.5 mg weekly.  Continue ambulatory glucose monitoring twice a day and limiting carbs/sugar.  Urine microalbumin collected today, foot exam performed.  Requesting copy of eye exam from September 2023.  Will continue to monitor.

## 2023-03-08 NOTE — Patient Instructions (Signed)
Continue drinking a lot of water and having a lot of fiber with a goal of 25 g of fiber a day.  It is safe to take MiraLAX (generic name is polyethylene glycol or PEG) daily if needed.

## 2023-03-08 NOTE — Progress Notes (Signed)
Established Patient Office Visit  Subjective   Patient ID: Rachel Simmons, female    DOB: 08/28/1966  Age: 57 y.o. MRN: [id number]  Chief Complaint  Patient presents with   Diabetes   Hyperlipidemia   Hypertension    HPI Tikesha Storz is a 57 y.o. female presenting today for follow up of hypertension, hyperlipidemia, diabetes.  She has had some pain on the left side of her neck under her ear and along her jawbone for the past 10 days or so.  She is also been experiencing some constipation. Hypertension: Patient here for follow-up of elevated blood pressure.  Pt denies chest pain, SOB, dizziness, edema, syncope, fatigue or heart palpitations. Taking lisinopril-HCTZ, reports excellent compliance with treatment. Denies side effects. Hyperlipidemia: tolerating atorvastatin well with no myalgias or significant side effects. Currently consuming a general diet but makes an effort to eat mostly lean meats and seafood with a lot of vegetables. The patient does not participate in regular exercise at present.  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 The ASCVD Risk score (Arnett DK, et al., 2019) failed to calculate for the following reasons:   The valid total cholesterol range is 130 to 320 mg/dL Diabetes: denies hypoglycemic events, wounds or sores that are not healing well, increased thirst or urination. Denies vision problems, eye exam completed in September. Checking glucose at home.Taking Ozempic as prescribed without any side effects.   ROS Negative unless otherwise noted in HPI   Objective:     BP 133/84 (BP Location: Left Arm, Patient Position: Sitting, Cuff Size: Large)   Pulse 88   Resp 18   Ht 5\' 9"  (1.753 m)   Wt 264 lb (119.7 kg)   SpO2 97%   BMI 38.99 kg/m   Physical Exam Constitutional:      General: She is not in acute distress.    Appearance: Normal appearance.  HENT:     Head: Normocephalic and atraumatic.     Right Ear: Tympanic membrane, ear canal and  external ear normal. No swelling or tenderness.     Left Ear: Tympanic membrane, ear canal and external ear normal. No swelling or tenderness.  Cardiovascular:     Rate and Rhythm: Normal rate and regular rhythm.     Pulses: Normal pulses.     Heart sounds: Normal heart sounds. No murmur heard.    No friction rub. No gallop.  Pulmonary:     Effort: Pulmonary effort is normal. No respiratory distress.     Breath sounds: No wheezing, rhonchi or rales.  Musculoskeletal:     Cervical back: Normal range of motion.  Lymphadenopathy:     Cervical: Cervical adenopathy (Tender to palpation on anterior chain) present.  Skin:    General: Skin is warm and dry.  Neurological:     Mental Status: She is alert and oriented to person, place, and time.     Assessment & Plan:  Type 2 diabetes mellitus with other specified complication, without long-term current use of insulin Assessment & Plan: Last A1c 6.4, repeating today.  Continue Ozempic 0.5 mg weekly.  Continue ambulatory glucose monitoring twice a day and limiting carbs/sugar.  Urine microalbumin collected today, foot exam performed.  Requesting copy of eye exam from September 2023.  Will continue to monitor.  Orders: -     POCT UA - Microalbumin -     Hemoglobin A1c; Future  Screening for viral disease -     Hepatitis C  antibody; Future -     HIV Antibody (routine testing w rflx); Future  Hypertension associated with diabetes Assessment & Plan: BP slightly above goal upon presentation at 133/84.  Continue lisinopril-HCTZ 20-25 mg daily.  Collecting CMP today for renal function and electrolytes.  Will continue to monitor.  Orders: -     CBC with Differential/Platelet; Future -     Comprehensive metabolic panel; Future  Hyperlipidemia associated with type 2 diabetes mellitus Assessment & Plan: Last lipid panel: LDL 53, HDL 58, triglycerides 98.  Meets goal of LDL less than 70.  Continue atorvastatin 10 mg daily.  Will recheck lipid  panel with annual physical lab work.  Orders: -     Lipid panel; Future  Other specified hypothyroidism Assessment & Plan: Last TSH within normal limits continue levothyroxine 112 mcg daily.  Will repeat thyroid labs TSH and T4 with annual physical.   Lymphadenopathy -     CBC with Differential/Platelet; Future -     Comprehensive metabolic panel; Future  Left-sided lymphadenopathy, starting workup with CMP and CBC.  Advised patient to call her oncologist to see what they may recommend as well.  Return in about 4 months (around 07/08/2023) for annual physical, fasting blood work 1 week before.    Velva Harman, PA

## 2023-03-09 ENCOUNTER — Encounter: Payer: Self-pay | Admitting: Family Medicine

## 2023-03-09 LAB — CBC WITH DIFFERENTIAL/PLATELET
Basophils Absolute: 0.1 10*3/uL (ref 0.0–0.2)
Basos: 1 %
EOS (ABSOLUTE): 0.2 10*3/uL (ref 0.0–0.4)
Eos: 1 %
Hematocrit: 38.7 % (ref 34.0–46.6)
Hemoglobin: 13.1 g/dL (ref 11.1–15.9)
Immature Grans (Abs): 0 10*3/uL (ref 0.0–0.1)
Immature Granulocytes: 0 %
Lymphocytes Absolute: 11.6 10*3/uL — ABNORMAL HIGH (ref 0.7–3.1)
Lymphs: 70 %
MCH: 29.4 pg (ref 26.6–33.0)
MCHC: 33.9 g/dL (ref 31.5–35.7)
MCV: 87 fL (ref 79–97)
Monocytes Absolute: 0.7 10*3/uL (ref 0.1–0.9)
Monocytes: 4 %
Neutrophils Absolute: 4 10*3/uL (ref 1.4–7.0)
Neutrophils: 24 %
Platelets: 252 10*3/uL (ref 150–450)
RBC: 4.46 x10E6/uL (ref 3.77–5.28)
RDW: 13.6 % (ref 11.7–15.4)
WBC: 16.7 10*3/uL — ABNORMAL HIGH (ref 3.4–10.8)

## 2023-03-09 LAB — COMPREHENSIVE METABOLIC PANEL
ALT: 27 IU/L (ref 0–32)
AST: 23 IU/L (ref 0–40)
Albumin/Globulin Ratio: 1.8 (ref 1.2–2.2)
Albumin: 4.3 g/dL (ref 3.8–4.9)
Alkaline Phosphatase: 79 IU/L (ref 44–121)
BUN/Creatinine Ratio: 21 (ref 9–23)
BUN: 12 mg/dL (ref 6–24)
Bilirubin Total: 0.3 mg/dL (ref 0.0–1.2)
CO2: 22 mmol/L (ref 20–29)
Calcium: 10.7 mg/dL — ABNORMAL HIGH (ref 8.7–10.2)
Chloride: 101 mmol/L (ref 96–106)
Creatinine, Ser: 0.57 mg/dL (ref 0.57–1.00)
Globulin, Total: 2.4 g/dL (ref 1.5–4.5)
Glucose: 102 mg/dL — ABNORMAL HIGH (ref 70–99)
Potassium: 3.8 mmol/L (ref 3.5–5.2)
Sodium: 138 mmol/L (ref 134–144)
Total Protein: 6.7 g/dL (ref 6.0–8.5)
eGFR: 107 mL/min/{1.73_m2} (ref >59)

## 2023-03-09 LAB — LIPID PANEL
Chol/HDL Ratio: 2.4 ratio (ref 0.0–4.4)
Cholesterol, Total: 144 mg/dL (ref 100–199)
HDL: 60 mg/dL (ref >39)
LDL Chol Calc (NIH): 53 mg/dL (ref 0–99)
Triglycerides: 190 mg/dL — ABNORMAL HIGH (ref 0–149)
VLDL Cholesterol Cal: 31 mg/dL (ref 5–40)

## 2023-03-09 LAB — HEMOGLOBIN A1C
Est. average glucose Bld gHb Est-mCnc: 140 mg/dL
Hgb A1c MFr Bld: 6.5 % — ABNORMAL HIGH (ref 4.8–5.6)

## 2023-03-09 LAB — HIV ANTIBODY (ROUTINE TESTING W REFLEX): HIV Screen 4th Generation wRfx: NONREACTIVE

## 2023-03-09 LAB — HEPATITIS C ANTIBODY: Hep C Virus Ab: NONREACTIVE

## 2023-03-13 ENCOUNTER — Telehealth: Payer: Self-pay

## 2023-03-13 ENCOUNTER — Other Ambulatory Visit: Payer: Self-pay | Admitting: Nurse Practitioner

## 2023-03-13 DIAGNOSIS — E1159 Type 2 diabetes mellitus with other circulatory complications: Secondary | ICD-10-CM

## 2023-03-13 NOTE — Telephone Encounter (Signed)
Pt called to report that her PCP drew labs last week. She was concerned that my WBC were 16.7. I told her that my white count is always elevated.

## 2023-04-07 ENCOUNTER — Other Ambulatory Visit: Payer: Self-pay | Admitting: Physician Assistant

## 2023-04-25 ENCOUNTER — Other Ambulatory Visit: Payer: Self-pay | Admitting: Family Medicine

## 2023-04-25 DIAGNOSIS — E1169 Type 2 diabetes mellitus with other specified complication: Secondary | ICD-10-CM

## 2023-05-02 ENCOUNTER — Encounter: Payer: Self-pay | Admitting: Family Medicine

## 2023-05-02 ENCOUNTER — Other Ambulatory Visit: Payer: Self-pay | Admitting: Nurse Practitioner

## 2023-05-02 DIAGNOSIS — E1169 Type 2 diabetes mellitus with other specified complication: Secondary | ICD-10-CM

## 2023-05-03 MED ORDER — OZEMPIC (0.25 OR 0.5 MG/DOSE) 2 MG/1.5ML ~~LOC~~ SOPN
0.5000 mg | PEN_INJECTOR | SUBCUTANEOUS | 0 refills | Status: DC
Start: 2023-05-03 — End: 2023-05-20

## 2023-05-18 ENCOUNTER — Other Ambulatory Visit: Payer: Self-pay | Admitting: Oncology

## 2023-05-18 DIAGNOSIS — C9110 Chronic lymphocytic leukemia of B-cell type not having achieved remission: Secondary | ICD-10-CM

## 2023-05-18 NOTE — Progress Notes (Signed)
[facility]  [address] [place],  [place]  [address] [phone]  Clinic Day:  05/19/2023  Referring physician: Mayer Masker, PA-C  HISTORY OF PRESENT ILLNESS:  Rachel Simmons is a 57 y.o. female with chronic lymphocytic leukemia, which was diagnosed per flow cytometry of her peripheral blood in June 2016.  As her disease has behaved very indolently, it has been followed conservatively, without any particular intervention.  She comes in today for routine follow up.  Since her last visit, the patient has been doing well.  She also denies having any B symptoms or bulky lymphadenopathy which concerns her for catabolic progression of her CLL.    VITALS:  Blood pressure (!) 149/91, pulse 72, temperature 97.9 F (36.6 C), resp. rate 16, height 5\' 9"  (1.753 m), weight 261 lb 12.8 oz (118.8 kg), SpO2 96 %.  Wt Readings from Last 3 Encounters:  05/19/23 261 lb 12.8 oz (118.8 kg)  03/08/23 264 lb (119.7 kg)  11/18/22 260 lb 12.8 oz (118.3 kg)    Body mass index is 38.66 kg/m.  Performance status (ECOG): 0 - Asymptomatic  PHYSICAL EXAM:  Physical Exam Constitutional:      General: She is not in acute distress.    Appearance: Normal appearance. She is normal weight.  HENT:     Head: Normocephalic and atraumatic.  Eyes:     General: No scleral icterus.    Extraocular Movements: Extraocular movements intact.     Conjunctiva/sclera: Conjunctivae normal.     Pupils: Pupils are equal, round, and reactive to light.  Cardiovascular:     Rate and Rhythm: Normal rate and regular rhythm.     Pulses: Normal pulses.     Heart sounds: Normal heart sounds. No murmur heard.    No friction rub. No gallop.  Pulmonary:     Effort: Pulmonary effort is normal. No respiratory distress.     Breath sounds: Normal breath sounds.  Abdominal:     General: Bowel sounds are normal. There is no distension.     Palpations: Abdomen is soft. There is no hepatomegaly,  splenomegaly or mass.     Tenderness: There is no abdominal tenderness.  Musculoskeletal:        General: Normal range of motion.     Cervical back: Normal range of motion and neck supple.     Right lower leg: No edema.     Left lower leg: No edema.  Lymphadenopathy:     Cervical: No cervical adenopathy.  Skin:    General: Skin is warm and dry.  Neurological:     General: No focal deficit present.     Mental Status: She is alert and oriented to person, place, and time. Mental status is at baseline.  Psychiatric:        Mood and Affect: Mood normal.        Behavior: Behavior normal.        Thought Content: Thought content normal.        Judgment: Judgment normal.    LABS:    ASSESSMENT & PLAN:  A 57 year old woman with chronic lymphocytic leukemia.  There remains nothing per her labs or physical exam today which suggests her disease is progressing to where intervention is needed. I am also pleased as her hemoglobin.   Overall, the patient continues to do very well.  I will see her back in 1 year for repeat clinical assessment.  The patient understands all the plans discussed today  and is in agreement with them.  Sullivan Jacuinde Kirby Funk, MD

## 2023-05-19 ENCOUNTER — Inpatient Hospital Stay: Attending: Oncology | Admitting: Oncology

## 2023-05-19 ENCOUNTER — Inpatient Hospital Stay

## 2023-05-19 ENCOUNTER — Other Ambulatory Visit: Payer: Self-pay | Admitting: Oncology

## 2023-05-19 VITALS — BP 149/91 | HR 72 | Temp 97.9°F | Resp 16 | Ht 69.0 in | Wt 261.8 lb

## 2023-05-19 DIAGNOSIS — C9110 Chronic lymphocytic leukemia of B-cell type not having achieved remission: Secondary | ICD-10-CM

## 2023-05-19 LAB — CBC AND DIFFERENTIAL
HCT: 39 (ref 36–46)
Hemoglobin: 13 (ref 12.0–16.0)
Neutrophils Absolute: 3.23
Platelets: 253 10*3/uL (ref 150–400)
WBC: 17

## 2023-05-19 LAB — CBC: RBC: 4.47 (ref 3.87–5.11)

## 2023-05-20 ENCOUNTER — Other Ambulatory Visit: Payer: Self-pay | Admitting: Family Medicine

## 2023-05-20 ENCOUNTER — Encounter: Payer: Self-pay | Admitting: Family Medicine

## 2023-05-20 DIAGNOSIS — F4323 Adjustment disorder with mixed anxiety and depressed mood: Secondary | ICD-10-CM

## 2023-05-20 DIAGNOSIS — E559 Vitamin D deficiency, unspecified: Secondary | ICD-10-CM

## 2023-05-20 DIAGNOSIS — E1169 Type 2 diabetes mellitus with other specified complication: Secondary | ICD-10-CM

## 2023-05-22 MED ORDER — OZEMPIC (0.25 OR 0.5 MG/DOSE) 2 MG/1.5ML ~~LOC~~ SOPN
0.5000 mg | PEN_INJECTOR | SUBCUTANEOUS | 0 refills | Status: DC
Start: 2023-05-22 — End: 2023-06-29

## 2023-05-23 MED ORDER — FLUOXETINE HCL 60 MG PO TABS
1.0000 | ORAL_TABLET | Freq: Every day | ORAL | 3 refills | Status: DC
Start: 2023-05-23 — End: 2023-08-24

## 2023-05-23 MED ORDER — VITAMIN D (ERGOCALCIFEROL) 1.25 MG (50000 UNIT) PO CAPS
ORAL_CAPSULE | ORAL | 3 refills | Status: DC
Start: 2023-05-23 — End: 2023-09-15

## 2023-05-29 ENCOUNTER — Other Ambulatory Visit: Payer: Self-pay | Admitting: Family Medicine

## 2023-05-29 DIAGNOSIS — Z1231 Encounter for screening mammogram for malignant neoplasm of breast: Secondary | ICD-10-CM

## 2023-05-31 ENCOUNTER — Ambulatory Visit

## 2023-06-16 ENCOUNTER — Ambulatory Visit
Admission: RE | Admit: 2023-06-16 | Discharge: 2023-06-16 | Disposition: A | Discharge status: Home / Self Care | Source: Ambulatory Visit | Attending: Family Medicine | Admitting: Family Medicine

## 2023-06-16 DIAGNOSIS — Z1231 Encounter for screening mammogram for malignant neoplasm of breast: Secondary | ICD-10-CM

## 2023-06-21 ENCOUNTER — Ambulatory Visit

## 2023-06-28 ENCOUNTER — Telehealth: Payer: Self-pay | Admitting: *Deleted

## 2023-06-28 DIAGNOSIS — E1169 Type 2 diabetes mellitus with other specified complication: Secondary | ICD-10-CM

## 2023-06-28 NOTE — Telephone Encounter (Signed)
Called pt LVM to contact the office °

## 2023-06-28 NOTE — Telephone Encounter (Signed)
-----   Message from Langley Adie sent at 06/27/2023  4:18 PM EDT ----- Regarding: Patient says Pharmacy doesn't have Ozempic Rx Patient called says she sent request for Rx refill  :  (but not showing @ Pharmacy) Semaglutide,0.25 or 0.5MG /DOS, (OZEMPIC, 0.25 OR 0.5 MG/DOSE,) 2 MG/1.5ML SOPN [[id number]]   Order Details Dose: 0.5 mg Route: Subcutaneous Frequency: Weekly Dispense Quantity: 3 mL Refills: 0      Sig: Inject 0.5 mg into the skin once a week.   --- Please send order for refill  to:  [facility] - [place], [place] - [address] [place] [address] Phone: [phone]  Fax: [phone]    --Thx

## 2023-06-29 ENCOUNTER — Other Ambulatory Visit: Payer: Self-pay

## 2023-06-29 DIAGNOSIS — E1169 Type 2 diabetes mellitus with other specified complication: Secondary | ICD-10-CM

## 2023-06-29 MED ORDER — OZEMPIC (0.25 OR 0.5 MG/DOSE) 2 MG/1.5ML ~~LOC~~ SOPN
0.5000 mg | PEN_INJECTOR | SUBCUTANEOUS | 2 refills | Status: DC
Start: 2023-06-29 — End: 2023-09-15

## 2023-06-29 MED ORDER — OZEMPIC (0.25 OR 0.5 MG/DOSE) 2 MG/1.5ML ~~LOC~~ SOPN
0.5000 mg | PEN_INJECTOR | SUBCUTANEOUS | 2 refills | Status: DC
Start: 2023-06-29 — End: 2023-06-29

## 2023-06-29 NOTE — Telephone Encounter (Signed)
Patient requested for Rx to be sent to [facility]

## 2023-06-29 NOTE — Telephone Encounter (Signed)
Meds ordered this encounter  Medications   Semaglutide,0.25 or 0.5MG /DOS, (OZEMPIC, 0.25 OR 0.5 MG/DOSE,) 2 MG/1.5ML SOPN    Sig: Inject 0.5 mg into the skin once a week.    Dispense:  3 mL    Refill:  2    Order Specific Question:   Supervising Provider    Answer:   Nani Gasser D [[id number]]

## 2023-06-29 NOTE — Telephone Encounter (Signed)
Pt is now only using  [facility] - [place], [place] - [address]

## 2023-07-11 ENCOUNTER — Other Ambulatory Visit: Payer: Self-pay | Admitting: Family Medicine

## 2023-07-11 ENCOUNTER — Encounter: Payer: Self-pay | Admitting: Family Medicine

## 2023-07-11 ENCOUNTER — Other Ambulatory Visit: Payer: Self-pay | Admitting: Nurse Practitioner

## 2023-07-11 DIAGNOSIS — F4323 Adjustment disorder with mixed anxiety and depressed mood: Secondary | ICD-10-CM

## 2023-07-11 DIAGNOSIS — E1159 Type 2 diabetes mellitus with other circulatory complications: Secondary | ICD-10-CM

## 2023-07-11 DIAGNOSIS — E1169 Type 2 diabetes mellitus with other specified complication: Secondary | ICD-10-CM

## 2023-07-11 DIAGNOSIS — E038 Other specified hypothyroidism: Secondary | ICD-10-CM

## 2023-07-11 NOTE — Telephone Encounter (Signed)
Pt is not a pt of Dr. Latanya Maudlin, Vincent Gros, DNP, nor Santiago Glad, NP

## 2023-07-12 ENCOUNTER — Other Ambulatory Visit: Payer: Self-pay | Admitting: Family Medicine

## 2023-07-12 DIAGNOSIS — I152 Hypertension secondary to endocrine disorders: Secondary | ICD-10-CM

## 2023-07-12 MED ORDER — ATORVASTATIN CALCIUM 10 MG PO TABS
10.0000 mg | ORAL_TABLET | Freq: Every day | ORAL | 0 refills | Status: DC
Start: 2023-07-12 — End: 2023-08-08

## 2023-07-12 MED ORDER — LEVOTHYROXINE SODIUM 112 MCG PO TABS
ORAL_TABLET | ORAL | 0 refills | Status: DC
Start: 2023-07-12 — End: 2023-08-25

## 2023-07-12 MED ORDER — LISINOPRIL-HYDROCHLOROTHIAZIDE 20-25 MG PO TABS
1.0000 | ORAL_TABLET | Freq: Every day | ORAL | 0 refills | Status: DC
Start: 2023-07-12 — End: 2023-08-25

## 2023-07-12 NOTE — Telephone Encounter (Signed)
I sent in a 90-day refill for the patient.

## 2023-07-12 NOTE — Progress Notes (Signed)
Refill for 90 days sent in for Zestoretic.

## 2023-08-08 ENCOUNTER — Other Ambulatory Visit: Payer: Self-pay

## 2023-08-08 DIAGNOSIS — E1169 Type 2 diabetes mellitus with other specified complication: Secondary | ICD-10-CM

## 2023-08-08 MED ORDER — ATORVASTATIN CALCIUM 10 MG PO TABS
10.0000 mg | ORAL_TABLET | Freq: Every day | ORAL | 0 refills | Status: DC
Start: 2023-08-08 — End: 2023-08-25

## 2023-08-23 ENCOUNTER — Encounter: Payer: Self-pay | Admitting: Family Medicine

## 2023-08-23 DIAGNOSIS — F4323 Adjustment disorder with mixed anxiety and depressed mood: Secondary | ICD-10-CM

## 2023-08-23 DIAGNOSIS — E1169 Type 2 diabetes mellitus with other specified complication: Secondary | ICD-10-CM

## 2023-08-24 ENCOUNTER — Other Ambulatory Visit

## 2023-08-24 MED ORDER — FREESTYLE LITE TEST VI STRP
ORAL_STRIP | 1 refills | Status: AC
Start: 2023-08-24 — End: ?

## 2023-08-24 MED ORDER — FLUOXETINE HCL 60 MG PO TABS
1.0000 | ORAL_TABLET | Freq: Every day | ORAL | 3 refills | Status: DC
Start: 2023-08-24 — End: 2023-08-25

## 2023-08-24 NOTE — Telephone Encounter (Addendum)
Pt called to say that she spoke to the pharmacies and she needs a 30 day supply of fluoxetine sent to [facility].  She said that the mail order takes 14+days to get this.  She also would like to have [facility].

## 2023-08-24 NOTE — Addendum Note (Signed)
Addended by: Saralyn Pilar on: 08/24/2023 08:36 AM   Modules accepted: Orders

## 2023-08-25 ENCOUNTER — Other Ambulatory Visit: Payer: Self-pay

## 2023-08-25 DIAGNOSIS — F4323 Adjustment disorder with mixed anxiety and depressed mood: Secondary | ICD-10-CM

## 2023-08-25 DIAGNOSIS — E038 Other specified hypothyroidism: Secondary | ICD-10-CM

## 2023-08-25 DIAGNOSIS — E1169 Type 2 diabetes mellitus with other specified complication: Secondary | ICD-10-CM

## 2023-08-25 DIAGNOSIS — I152 Hypertension secondary to endocrine disorders: Secondary | ICD-10-CM

## 2023-08-25 MED ORDER — ATORVASTATIN CALCIUM 10 MG PO TABS: 10.0000 mg | ORAL_TABLET | Freq: Every day | ORAL | 0 refills | Status: DC

## 2023-08-25 MED ORDER — LISINOPRIL-HYDROCHLOROTHIAZIDE 20-25 MG PO TABS: 1.0000 | ORAL_TABLET | Freq: Every day | ORAL | 0 refills | Status: DC

## 2023-08-25 MED ORDER — LEVOTHYROXINE SODIUM 112 MCG PO TABS
ORAL_TABLET | ORAL | 0 refills | Status: DC
Start: 2023-08-25 — End: 2023-09-15

## 2023-08-25 MED ORDER — FLUOXETINE HCL 60 MG PO TABS: 1.0000 | ORAL_TABLET | Freq: Every day | ORAL | 0 refills | Status: DC

## 2023-08-31 ENCOUNTER — Other Ambulatory Visit: Payer: Self-pay

## 2023-08-31 ENCOUNTER — Encounter: Admitting: Family Medicine

## 2023-08-31 DIAGNOSIS — E1159 Type 2 diabetes mellitus with other circulatory complications: Secondary | ICD-10-CM

## 2023-08-31 DIAGNOSIS — E1169 Type 2 diabetes mellitus with other specified complication: Secondary | ICD-10-CM

## 2023-08-31 DIAGNOSIS — Z0000 Encounter for general adult medical examination without abnormal findings: Secondary | ICD-10-CM

## 2023-09-08 ENCOUNTER — Other Ambulatory Visit

## 2023-09-08 DIAGNOSIS — E1159 Type 2 diabetes mellitus with other circulatory complications: Secondary | ICD-10-CM

## 2023-09-08 DIAGNOSIS — Z0000 Encounter for general adult medical examination without abnormal findings: Secondary | ICD-10-CM

## 2023-09-08 DIAGNOSIS — E1169 Type 2 diabetes mellitus with other specified complication: Secondary | ICD-10-CM

## 2023-09-09 LAB — CBC WITH DIFFERENTIAL/PLATELET
Basophils Absolute: 0.1 10*3/uL (ref 0.0–0.2)
Basos: 1 %
EOS (ABSOLUTE): 0.2 10*3/uL (ref 0.0–0.4)
Eos: 1 %
Hematocrit: 40.6 % (ref 34.0–46.6)
Hemoglobin: 12.5 g/dL (ref 11.1–15.9)
Immature Grans (Abs): 0 10*3/uL (ref 0.0–0.1)
Immature Granulocytes: 0 %
Lymphocytes Absolute: 10.2 10*3/uL — ABNORMAL HIGH (ref 0.7–3.1)
Lymphs: 72 %
MCH: 27.9 pg (ref 26.6–33.0)
MCHC: 30.8 g/dL — ABNORMAL LOW (ref 31.5–35.7)
MCV: 91 fL (ref 79–97)
Monocytes Absolute: 0.6 10*3/uL (ref 0.1–0.9)
Monocytes: 4 %
Neutrophils Absolute: 3.2 10*3/uL (ref 1.4–7.0)
Neutrophils: 22 %
Platelets: 229 10*3/uL (ref 150–450)
RBC: 4.48 x10E6/uL (ref 3.77–5.28)
RDW: 13.6 % (ref 11.7–15.4)
WBC: 14.3 10*3/uL — ABNORMAL HIGH (ref 3.4–10.8)

## 2023-09-09 LAB — COMPREHENSIVE METABOLIC PANEL
ALT: 23 [IU]/L (ref 0–32)
AST: 23 [IU]/L (ref 0–40)
Albumin: 4 g/dL (ref 3.8–4.9)
Alkaline Phosphatase: 68 [IU]/L (ref 44–121)
BUN/Creatinine Ratio: 22 (ref 9–23)
BUN: 11 mg/dL (ref 6–24)
Bilirubin Total: 0.5 mg/dL (ref 0.0–1.2)
CO2: 27 mmol/L (ref 20–29)
Calcium: 10 mg/dL (ref 8.7–10.2)
Chloride: 102 mmol/L (ref 96–106)
Creatinine, Ser: 0.5 mg/dL — ABNORMAL LOW (ref 0.57–1.00)
Globulin, Total: 2 g/dL (ref 1.5–4.5)
Glucose: 90 mg/dL (ref 70–99)
Potassium: 4.4 mmol/L (ref 3.5–5.2)
Sodium: 140 mmol/L (ref 134–144)
Total Protein: 6 g/dL (ref 6.0–8.5)
eGFR: 111 mL/min/{1.73_m2} (ref >59)

## 2023-09-09 LAB — TSH: TSH: 0.64 u[IU]/mL (ref 0.450–4.500)

## 2023-09-09 LAB — LIPID PANEL
Chol/HDL Ratio: 2.1 {ratio} (ref 0.0–4.4)
Cholesterol, Total: 124 mg/dL (ref 100–199)
HDL: 58 mg/dL (ref >39)
LDL Chol Calc (NIH): 44 mg/dL (ref 0–99)
Triglycerides: 127 mg/dL (ref 0–149)
VLDL Cholesterol Cal: 22 mg/dL (ref 5–40)

## 2023-09-09 LAB — HEMOGLOBIN A1C
Est. average glucose Bld gHb Est-mCnc: 123 mg/dL
Hgb A1c MFr Bld: 5.9 % — ABNORMAL HIGH (ref 4.8–5.6)

## 2023-09-15 ENCOUNTER — Encounter: Payer: Self-pay | Admitting: Family Medicine

## 2023-09-15 ENCOUNTER — Ambulatory Visit (INDEPENDENT_AMBULATORY_CARE_PROVIDER_SITE_OTHER): Admitting: Family Medicine

## 2023-09-15 VITALS — BP 116/73 | HR 68 | Resp 18 | Ht 69.0 in | Wt 252.0 lb

## 2023-09-15 DIAGNOSIS — E119 Type 2 diabetes mellitus without complications: Secondary | ICD-10-CM | POA: Diagnosis not present

## 2023-09-15 DIAGNOSIS — F419 Anxiety disorder, unspecified: Secondary | ICD-10-CM

## 2023-09-15 DIAGNOSIS — E1169 Type 2 diabetes mellitus with other specified complication: Secondary | ICD-10-CM | POA: Diagnosis not present

## 2023-09-15 DIAGNOSIS — E1159 Type 2 diabetes mellitus with other circulatory complications: Secondary | ICD-10-CM

## 2023-09-15 DIAGNOSIS — Z0000 Encounter for general adult medical examination without abnormal findings: Secondary | ICD-10-CM

## 2023-09-15 DIAGNOSIS — F32A Depression, unspecified: Secondary | ICD-10-CM

## 2023-09-15 DIAGNOSIS — E785 Hyperlipidemia, unspecified: Secondary | ICD-10-CM

## 2023-09-15 DIAGNOSIS — Z23 Encounter for immunization: Secondary | ICD-10-CM | POA: Diagnosis not present

## 2023-09-15 DIAGNOSIS — E038 Other specified hypothyroidism: Secondary | ICD-10-CM

## 2023-09-15 DIAGNOSIS — D72829 Elevated white blood cell count, unspecified: Secondary | ICD-10-CM

## 2023-09-15 DIAGNOSIS — Z7984 Long term (current) use of oral hypoglycemic drugs: Secondary | ICD-10-CM

## 2023-09-15 DIAGNOSIS — I152 Hypertension secondary to endocrine disorders: Secondary | ICD-10-CM

## 2023-09-15 DIAGNOSIS — M1990 Unspecified osteoarthritis, unspecified site: Secondary | ICD-10-CM

## 2023-09-15 MED ORDER — LISINOPRIL-HYDROCHLOROTHIAZIDE 20-25 MG PO TABS
1.0000 | ORAL_TABLET | Freq: Every day | ORAL | 1 refills | Status: DC
Start: 2023-09-15 — End: 2024-02-28

## 2023-09-15 MED ORDER — FLUOXETINE HCL 60 MG PO TABS
1.0000 | ORAL_TABLET | Freq: Every day | ORAL | 1 refills | Status: DC
Start: 2023-09-15 — End: 2024-02-28

## 2023-09-15 MED ORDER — LEVOTHYROXINE SODIUM 112 MCG PO TABS
112.0000 ug | ORAL_TABLET | Freq: Every day | ORAL | 1 refills | Status: DC
Start: 2023-09-15 — End: 2024-02-28

## 2023-09-15 MED ORDER — ATORVASTATIN CALCIUM 10 MG PO TABS
5.0000 mg | ORAL_TABLET | Freq: Every day | ORAL | 1 refills | Status: DC
Start: 2023-09-15 — End: 2024-02-28

## 2023-09-15 MED ORDER — DICLOFENAC SODIUM 1 % EX GEL
2.0000 g | Freq: Four times a day (QID) | CUTANEOUS | 5 refills | Status: DC
Start: 2023-09-15 — End: 2024-02-28

## 2023-09-15 MED ORDER — OZEMPIC (0.25 OR 0.5 MG/DOSE) 2 MG/1.5ML ~~LOC~~ SOPN: 0.5000 mg | PEN_INJECTOR | SUBCUTANEOUS | 2 refills | Status: DC

## 2023-09-15 NOTE — Assessment & Plan Note (Addendum)
BP goal <130/80.  Stable, at goal.  Continue lisinopril-HCTZ 20-25 mg daily.  CMP within normal limits with the exception of slightly low creatinine.  Will continue to monitor.

## 2023-09-15 NOTE — Assessment & Plan Note (Signed)
Continue with nutritional changes and exercise routine.  Continue Ozempic 0.5 mg weekly.  Will continue to monitor.

## 2023-09-15 NOTE — Assessment & Plan Note (Signed)
Well-controlled, A1c 5.9.  Continue Ozempic 0.5 mg weekly.  Continue ambulatory glucose monitoring twice a day and limiting carbs/sugar.  Will continue to monitor.

## 2023-09-15 NOTE — Patient Instructions (Addendum)
Keep up the great work!  You are almost completely caught up with your preventative care which is fantastic :)  Vitamin D3 2000 units daily supplement  1/2 tablet of atorvastatin daily  Compression socks are available in different levels of strength, or compression, which are measured in millimeters of mercury (mmHg): Light compression: 8-15 mmHg Mild compression: 15-20 mmHg Medium compression: 20-30 mmHg Firm compression: 30-40 mmHg Extra firm compression: 50-60 mmHg  I recommend increasing at least one level from your current compression socks.

## 2023-09-15 NOTE — Assessment & Plan Note (Signed)
WBC 14.3.  Will continue to monitor.  Continue routine follow-up with hematology/oncology.

## 2023-09-15 NOTE — Assessment & Plan Note (Addendum)
Last lipid panel: LDL 44, HDL 58, triglycerides 127.  Meets goal of LDL less than 70.  Continue atorvastatin but decrease dose to 5 mg daily.  Will recheck lipid panel in 4 months, discussed that with continued weight loss and blood sugar control it may be possible to discontinue atorvastatin altogether one day.

## 2023-09-15 NOTE — Progress Notes (Signed)
Complete physical exam  Patient: Rachel Simmons   DOB: 1966/03/05   57 y.o. Female  MRN: [id number]  Subjective:    Chief Complaint  Patient presents with   Annual Exam    Rachel Simmons is a 57 y.o. female who presents today for a complete physical exam. She reports consuming a  low carb  diet.  She uses a stepping machine at 3 AM each day with her daughter.  She generally feels well. She has noticed increased frequency of the swelling of her left leg after standing or walking long periods of time, even when wearing compression socks.  She also has a small skin tag on the medial corner of her right eye that she would like examined.  She has also had increasing pain in her right shoulder and knees.  Most recent fall risk assessment:    03/08/2023    3:17 PM  Fall Risk   Falls in the past year? 1  Number falls in past yr: 1  Injury with Fall? 1  Risk for fall due to : History of fall(s)     Most recent depression and anxiety screenings:    09/15/2023    8:42 AM 03/08/2023    3:17 PM  PHQ 2/9 Scores  PHQ - 2 Score 0 0  PHQ- 9 Score 0 2      03/08/2023    3:17 PM 10/21/2022   10:53 AM 06/24/2022    8:54 AM 02/18/2022    8:21 AM  GAD 7 : Generalized Anxiety Score  Nervous, Anxious, on Edge 0 1 0 0  Control/stop worrying 0 0 0 0  Worry too much - different things 0 0 0 0  Trouble relaxing 0 0 0 0  Restless 0 1 0 0  Easily annoyed or irritable 0 1 0 0  Afraid - awful might happen 0 0 0 0  Total GAD 7 Score 0 3 0 0  Anxiety Difficulty Not difficult at all Not difficult at all Not difficult at all Not difficult at all    Patient Active Problem List   Diagnosis Date Noted   Vitamin D deficiency 11/09/2020   Bilateral leg cramps 10/05/2020   Myalgias and generalized arthralgias 06/20/2018   Left tennis elbow 10/10/2017   Muscle dysfunction/ spasm- upper traps on L 10/10/2017   Insomnia 10/18/2016   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 10/18/2016   Hyperlipidemia  associated with type 2 diabetes mellitus (HCC) 10/18/2016   Anxiety and depression 08/20/2016   Morbid obesity (HCC) 08/20/2016   CLL (chronic lymphocytic leukemia) (HCC) 08/18/2016   Diabetes mellitus (HCC) 08/18/2016   Hypertension associated with diabetes (HCC) 08/18/2016   Chronic back pain greater than 3 months duration 08/18/2016   Hypothyroidism 08/18/2016   Diabetic peripheral neuropathy (HCC) 04/21/2015    Past Surgical History:  Procedure Laterality Date   ABDOMINAL HYSTERECTOMY  2006   reports ovaries and cervix are intact   TONSILLECTOMY     Social History   Tobacco Use   Smoking status: Never    Passive exposure: Never   Smokeless tobacco: Never  Vaping Use   Vaping status: Never Used  Substance Use Topics   Alcohol use: No   Drug use: No   Family History  Problem Relation Age of Onset   Breast cancer Mother        32s; deceased 25   Colon polyps Mother        Dx 61s; #/type unknown  Diabetes Mother    Heart disease Mother    Thyroid disease Mother    Sleep apnea Mother    Obesity Mother    Cancer Father        lung cancer; smoker; deceased 52   Alcoholism Father    Breast cancer Sister 78       triple negative; currently 66   Cancer Maternal Aunt        lung cancer; smoker; currently 46   Colon cancer Maternal Uncle        Dx 50s; currently 36   Breast cancer Paternal Aunt        Dx 88s; currently 72s   Cancer Paternal Uncle        liver; heavy drinker   Cancer Maternal Grandfather        unk. primary; deceased 61s   Cancer Paternal Grandmother        Dx 62s; unknown primary   Breast cancer Sister 68       currently 68   Uterine cancer Sister 74       currently 48   Cancer Paternal Uncle        lung; heavy smoker   Breast cancer Cousin        mat first cousin through aunt; poss. breast ca; had mastectomy; currently 50   Ovarian cancer Other        mat grandmother's sister   Cancer Other        stomach cancer; mat grandmother's sister    Colon polyps Brother        age, #, type unknown   No Known Allergies   Patient Care Team: Melida Quitter, PA as PCP - General (Family Medicine) Weston Settle, MD as Consulting Physician (Oncology) Conley Rolls, [place], [place] as Referring Physician (Optometry) Genevive Bi, MD as Referring Physician (Internal Medicine) Genia Del, MD as Consulting Physician (Obstetrics and Gynecology)   Outpatient Medications Prior to Visit  Medication Sig   ASPIRIN 81 PO Take by mouth.   cetirizine (ZYRTEC) 5 MG tablet Take 1 tablet by mouth daily.   Ferrous Sulfate (IRON PO) Take by mouth 2 (two) times daily.   glucose blood (FREESTYLE LITE) test strip USE TO CHECK FASTING BLOOD SUGAR AND 2 HOURS AFTER EVERY MEAL.   Multiple Vitamins-Minerals (ONE-A-DAY WOMENS 50 PLUS PO) Take 1 tablet daily by mouth.   [DISCONTINUED] atorvastatin (LIPITOR) 10 MG tablet Take 1 tablet (10 mg total) by mouth daily.   [DISCONTINUED] FLUoxetine HCl 60 MG TABS Take 1 tablet by mouth daily.   [DISCONTINUED] levothyroxine (SYNTHROID) 112 MCG tablet TAKE 1 TABLET DAILY BEFORE BREAKFAST (NEED APPOINTMENT FOR REFILLS)   [DISCONTINUED] lisinopril-hydrochlorothiazide (ZESTORETIC) 20-25 MG tablet Take 1 tablet by mouth daily.   [DISCONTINUED] Semaglutide,0.25 or 0.5MG /DOS, (OZEMPIC, 0.25 OR 0.5 MG/DOSE,) 2 MG/1.5ML SOPN Inject 0.5 mg into the skin once a week.   [DISCONTINUED] Vitamin D, Ergocalciferol, (DRISDOL) 1.25 MG (50000 UNIT) CAPS capsule TAKE 1 CAPSULE EVERY 7 DAYS   No facility-administered medications prior to visit.    Review of Systems  Constitutional:  Negative for chills, fever and malaise/fatigue.  HENT:  Negative for congestion and hearing loss.   Eyes:  Negative for blurred vision and double vision.  Respiratory:  Negative for cough and shortness of breath.   Cardiovascular:  Positive for leg swelling. Negative for chest pain and palpitations.  Gastrointestinal:  Negative for abdominal pain,  constipation, diarrhea and heartburn.  Genitourinary:  Negative for frequency and urgency.  Musculoskeletal:  Positive for joint pain. Negative for myalgias and neck pain.  Neurological:  Negative for headaches.  Endo/Heme/Allergies:  Negative for polydipsia.  Psychiatric/Behavioral:  Negative for depression. The patient is not nervous/anxious.       Objective:    BP 116/73 (BP Location: Left Arm, Patient Position: Sitting, Cuff Size: Large)   Pulse 68   Resp 18   Ht 5\' 9"  (1.753 m)   Wt 252 lb (114.3 kg)   SpO2 97%   BMI 37.21 kg/m    Physical Exam Constitutional:      General: She is not in acute distress.    Appearance: Normal appearance.  HENT:     Head: Normocephalic and atraumatic.     Right Ear: Tympanic membrane, ear canal and external ear normal.     Left Ear: Tympanic membrane, ear canal and external ear normal.     Nose: Nose normal.     Mouth/Throat:     Mouth: Mucous membranes are moist.     Pharynx: No oropharyngeal exudate or posterior oropharyngeal erythema.  Eyes:     Extraocular Movements: Extraocular movements intact.     Conjunctiva/sclera: Conjunctivae normal.     Pupils: Pupils are equal, round, and reactive to light.     Comments: Eye exam up-to-date.  1 mm white milia present on medial corner of upper right eyelid  Neck:     Thyroid: No thyroid mass, thyromegaly or thyroid tenderness.  Cardiovascular:     Rate and Rhythm: Normal rate and regular rhythm.     Heart sounds: Normal heart sounds. No murmur heard.    No friction rub. No gallop.  Pulmonary:     Effort: Pulmonary effort is normal. No respiratory distress.     Breath sounds: Normal breath sounds. No wheezing, rhonchi or rales.  Abdominal:     General: Abdomen is flat. Bowel sounds are normal. There is no distension.     Palpations: There is no mass.     Tenderness: There is no abdominal tenderness. There is no guarding.  Musculoskeletal:        General: Normal range of motion.      Cervical back: Normal range of motion and neck supple.     Right lower leg: No edema.     Left lower leg: Edema (Nonpitting, nontender) present.     Comments: Varicose veins bilaterally  Lymphadenopathy:     Cervical: No cervical adenopathy.  Skin:    General: Skin is warm and dry.  Neurological:     Mental Status: She is alert and oriented to person, place, and time.     Cranial Nerves: No cranial nerve deficit.     Motor: No weakness.     Deep Tendon Reflexes: Reflexes normal.  Psychiatric:        Mood and Affect: Mood normal.       Assessment & Plan:    Routine Health Maintenance and Physical Exam  Immunization History  Administered Date(s) Administered   Influenza, Seasonal, Injecte, Preservative Fre 09/15/2023   Influenza,inj,Quad PF,6+ Mos 08/26/2016, 10/10/2017, 09/18/2019, 12/24/2020, 10/21/2022   Influenza,inj,quad, With Preservative 08/26/2016   Influenza-Unspecified 09/04/2016   PFIZER(Purple Top)SARS-COV-2 Vaccination 02/27/2020, 03/24/2020   Pneumococcal Polysaccharide-23 10/18/2016   Tdap 08/26/2016   Zoster Recombinant(Shingrix) 09/15/2023    Health Maintenance  Topic Date Due   OPHTHALMOLOGY EXAM  09/17/2020   COVID-19 Vaccine (3 - Pfizer risk series) 10/01/2023 (Originally 04/21/2020)   Zoster Vaccines- Shingrix (2 of 2) 11/10/2023   Diabetic  kidney evaluation - Urine ACR  03/07/2024   FOOT EXAM  03/07/2024   HEMOGLOBIN A1C  03/08/2024   Diabetic kidney evaluation - eGFR measurement  09/07/2024   MAMMOGRAM  06/15/2025   Cervical Cancer Screening (HPV/Pap Cotest)  07/29/2025   DTaP/Tdap/Td (2 - Td or Tdap) 08/26/2026   Colonoscopy  01/05/2031   INFLUENZA VACCINE  Completed   Hepatitis C Screening  Completed   HIV Screening  Completed   HPV VACCINES  Aged Out    Reviewed most recent labs including CBC, CMP, lipid panel, A1C, TSH, and vitamin D. All within normal limits/stable from last check other than Creatinine low at 0.50, A1c 5.9, white blood  cells elevated 14.3. Agreeable to flu vaccine today.  Discussed recommendation for shingles vaccine particularly given her immunocompromise state with both CLL and diabetes, agreeable to first dose today. Eye exam up-to-date, requesting copies.  Discussed health benefits of physical activity, and encouraged her to engage in regular exercise appropriate for her age and condition.  Wellness examination  Hypertension associated with diabetes (HCC) Assessment & Plan: BP goal <130/80.  Stable, at goal.  Continue lisinopril-HCTZ 20-25 mg daily.  CMP within normal limits with the exception of slightly low creatinine.  Will continue to monitor.  Orders: -     Lisinopril-hydroCHLOROthiazide; Take 1 tablet by mouth daily.  Dispense: 90 tablet; Refill: 1  Hyperlipidemia associated with type 2 diabetes mellitus (HCC) Assessment & Plan: Last lipid panel: LDL 44, HDL 58, triglycerides 127.  Meets goal of LDL less than 70.  Continue atorvastatin but decrease dose to 5 mg daily.  Will recheck lipid panel in 4 months, discussed that with continued weight loss and blood sugar control it may be possible to discontinue atorvastatin altogether one day.  Orders: -     Atorvastatin Calcium; Take 0.5 tablets (5 mg total) by mouth daily.  Dispense: 45 tablet; Refill: 1  Type 2 diabetes mellitus without complication, without long-term current use of insulin (HCC) Assessment & Plan: Well-controlled, A1c 5.9.  Continue Ozempic 0.5 mg weekly.  Continue ambulatory glucose monitoring twice a day and limiting carbs/sugar.  Will continue to monitor.  Orders: -     Ozempic (0.25 or 0.5 MG/DOSE); Inject 0.5 mg into the skin once a week.  Dispense: 3 mL; Refill: 2  Other specified hypothyroidism Assessment & Plan: Last TSH within normal limits.  Continue levothyroxine 112 mcg daily.  Will continue to monitor.  Orders: -     Levothyroxine Sodium; Take 1 tablet (112 mcg total) by mouth daily before breakfast.  Dispense:  90 tablet; Refill: 1  Leukocytosis, unspecified type Assessment & Plan: WBC 14.3.  Will continue to monitor.  Continue routine follow-up with hematology/oncology.   Anxiety and depression Assessment & Plan: Stable.  Continue fluoxetine 60 mg daily.  Will continue to monitor.  Orders: -     FLUoxetine HCl; Take 1 tablet by mouth daily.  Dispense: 90 tablet; Refill: 1  Need for influenza vaccination -     Flu vaccine trivalent PF, 6mos and older(Flulaval,Afluria,Fluarix,Fluzone)  Need for zoster vaccination -     Varicella-zoster vaccine IM  Arthritis -     Diclofenac Sodium; Apply 2 g topically 4 (four) times daily. To affected joint.  Dispense: 150 g; Refill: 5  Morbid obesity (HCC) Assessment & Plan: Continue with nutritional changes and exercise routine.  Continue Ozempic 0.5 mg weekly.  Will continue to monitor.     Return in about 4 months (around 01/16/2024) for follow-up  for HTN, HLD, DM, fasting blood work 1 week before.     Melida Quitter, PA

## 2023-09-15 NOTE — Assessment & Plan Note (Signed)
Last TSH within normal limits.  Continue levothyroxine 112 mcg daily.  Will continue to monitor.

## 2023-09-15 NOTE — Assessment & Plan Note (Signed)
Stable.  Continue fluoxetine 60 mg daily.  Will continue to monitor.

## 2023-10-19 ENCOUNTER — Encounter: Payer: Self-pay | Admitting: Family Medicine

## 2023-10-19 DIAGNOSIS — M1990 Unspecified osteoarthritis, unspecified site: Secondary | ICD-10-CM

## 2023-10-19 MED ORDER — MELOXICAM 7.5 MG PO TABS
7.5000 mg | ORAL_TABLET | Freq: Every day | ORAL | 2 refills | Status: DC | PRN
Start: 2023-10-19 — End: 2023-10-19

## 2023-10-19 MED ORDER — MELOXICAM 7.5 MG PO TABS
7.5000 mg | ORAL_TABLET | Freq: Every day | ORAL | 2 refills | Status: DC | PRN
Start: 2023-10-19 — End: 2024-02-28

## 2023-10-19 NOTE — Telephone Encounter (Signed)
Care team updated and letter sent for eye exam notes.

## 2023-12-25 ENCOUNTER — Other Ambulatory Visit: Payer: Self-pay

## 2023-12-25 DIAGNOSIS — E1169 Type 2 diabetes mellitus with other specified complication: Secondary | ICD-10-CM

## 2023-12-25 DIAGNOSIS — E119 Type 2 diabetes mellitus without complications: Secondary | ICD-10-CM

## 2023-12-25 DIAGNOSIS — I152 Hypertension secondary to endocrine disorders: Secondary | ICD-10-CM

## 2024-01-11 ENCOUNTER — Encounter: Payer: Self-pay | Admitting: Family Medicine

## 2024-01-12 ENCOUNTER — Other Ambulatory Visit

## 2024-01-19 ENCOUNTER — Ambulatory Visit: Admitting: Family Medicine

## 2024-01-19 ENCOUNTER — Other Ambulatory Visit

## 2024-01-30 ENCOUNTER — Other Ambulatory Visit: Payer: Self-pay

## 2024-01-30 DIAGNOSIS — E1169 Type 2 diabetes mellitus with other specified complication: Secondary | ICD-10-CM

## 2024-01-30 DIAGNOSIS — I152 Hypertension secondary to endocrine disorders: Secondary | ICD-10-CM

## 2024-02-16 ENCOUNTER — Other Ambulatory Visit

## 2024-02-16 DIAGNOSIS — E1169 Type 2 diabetes mellitus with other specified complication: Secondary | ICD-10-CM

## 2024-02-16 DIAGNOSIS — E1159 Type 2 diabetes mellitus with other circulatory complications: Secondary | ICD-10-CM

## 2024-02-17 LAB — CBC WITH DIFFERENTIAL/PLATELET
Basophils Absolute: 0.1 10*3/uL (ref 0.0–0.2)
Basos: 1 %
EOS (ABSOLUTE): 0.2 10*3/uL (ref 0.0–0.4)
Eos: 1 %
Hematocrit: 40.8 % (ref 34.0–46.6)
Hemoglobin: 13.5 g/dL (ref 11.1–15.9)
Immature Grans (Abs): 0 10*3/uL (ref 0.0–0.1)
Immature Granulocytes: 0 %
Lymphocytes Absolute: 11.1 10*3/uL — ABNORMAL HIGH (ref 0.7–3.1)
Lymphs: 73 %
MCH: 29.2 pg (ref 26.6–33.0)
MCHC: 33.1 g/dL (ref 31.5–35.7)
MCV: 88 fL (ref 79–97)
Monocytes Absolute: 0.6 10*3/uL (ref 0.1–0.9)
Monocytes: 4 %
Neutrophils Absolute: 3.3 10*3/uL (ref 1.4–7.0)
Neutrophils: 21 %
Platelets: 280 10*3/uL (ref 150–450)
RBC: 4.62 x10E6/uL (ref 3.77–5.28)
RDW: 13.3 % (ref 11.7–15.4)
WBC: 15.3 10*3/uL — ABNORMAL HIGH (ref 3.4–10.8)

## 2024-02-17 LAB — COMPREHENSIVE METABOLIC PANEL
ALT: 27 IU/L (ref 0–32)
AST: 22 IU/L (ref 0–40)
Albumin: 4.2 g/dL (ref 3.8–4.9)
Alkaline Phosphatase: 91 IU/L (ref 44–121)
BUN/Creatinine Ratio: 17 (ref 9–23)
BUN: 11 mg/dL (ref 6–24)
Bilirubin Total: 0.3 mg/dL (ref 0.0–1.2)
CO2: 26 mmol/L (ref 20–29)
Calcium: 10 mg/dL (ref 8.7–10.2)
Chloride: 101 mmol/L (ref 96–106)
Creatinine, Ser: 0.63 mg/dL (ref 0.57–1.00)
Globulin, Total: 2.2 g/dL (ref 1.5–4.5)
Glucose: 120 mg/dL — ABNORMAL HIGH (ref 70–99)
Potassium: 4.5 mmol/L (ref 3.5–5.2)
Sodium: 139 mmol/L (ref 134–144)
Total Protein: 6.4 g/dL (ref 6.0–8.5)
eGFR: 104 mL/min/{1.73_m2} (ref >59)

## 2024-02-17 LAB — HEMOGLOBIN A1C
Est. average glucose Bld gHb Est-mCnc: 131 mg/dL
Hgb A1c MFr Bld: 6.2 % — ABNORMAL HIGH (ref 4.8–5.6)

## 2024-02-21 ENCOUNTER — Ambulatory Visit: Admitting: Family Medicine

## 2024-02-28 ENCOUNTER — Ambulatory Visit: Admitting: Family Medicine

## 2024-02-28 ENCOUNTER — Encounter: Payer: Self-pay | Admitting: Family Medicine

## 2024-02-28 VITALS — BP 101/67 | HR 73 | Ht 69.0 in | Wt 260.8 lb

## 2024-02-28 DIAGNOSIS — F32A Depression, unspecified: Secondary | ICD-10-CM

## 2024-02-28 DIAGNOSIS — E1169 Type 2 diabetes mellitus with other specified complication: Secondary | ICD-10-CM | POA: Diagnosis not present

## 2024-02-28 DIAGNOSIS — E119 Type 2 diabetes mellitus without complications: Secondary | ICD-10-CM | POA: Diagnosis not present

## 2024-02-28 DIAGNOSIS — E1159 Type 2 diabetes mellitus with other circulatory complications: Secondary | ICD-10-CM

## 2024-02-28 DIAGNOSIS — E038 Other specified hypothyroidism: Secondary | ICD-10-CM

## 2024-02-28 DIAGNOSIS — E785 Hyperlipidemia, unspecified: Secondary | ICD-10-CM

## 2024-02-28 DIAGNOSIS — Z7985 Long-term (current) use of injectable non-insulin antidiabetic drugs: Secondary | ICD-10-CM

## 2024-02-28 DIAGNOSIS — C9110 Chronic lymphocytic leukemia of B-cell type not having achieved remission: Secondary | ICD-10-CM | POA: Diagnosis not present

## 2024-02-28 DIAGNOSIS — M2550 Pain in unspecified joint: Secondary | ICD-10-CM | POA: Insufficient documentation

## 2024-02-28 DIAGNOSIS — R600 Localized edema: Secondary | ICD-10-CM | POA: Insufficient documentation

## 2024-02-28 DIAGNOSIS — I152 Hypertension secondary to endocrine disorders: Secondary | ICD-10-CM

## 2024-02-28 DIAGNOSIS — F419 Anxiety disorder, unspecified: Secondary | ICD-10-CM

## 2024-02-28 MED ORDER — CELECOXIB 100 MG PO CAPS: 100.0000 mg | ORAL_CAPSULE | Freq: Two times a day (BID) | ORAL | 3 refills | Status: DC

## 2024-02-28 MED ORDER — OZEMPIC (0.25 OR 0.5 MG/DOSE) 2 MG/1.5ML ~~LOC~~ SOPN: 0.2500 mg | PEN_INJECTOR | SUBCUTANEOUS | 1 refills | Status: DC

## 2024-02-28 MED ORDER — LISINOPRIL-HYDROCHLOROTHIAZIDE 20-25 MG PO TABS: 1.0000 | ORAL_TABLET | Freq: Every day | ORAL | 1 refills | Status: DC

## 2024-02-28 MED ORDER — ATORVASTATIN CALCIUM 10 MG PO TABS: 5.0000 mg | ORAL_TABLET | Freq: Every day | ORAL | 1 refills | Status: DC

## 2024-02-28 MED ORDER — LEVOTHYROXINE SODIUM 112 MCG PO TABS: 112.0000 ug | ORAL_TABLET | Freq: Every day | ORAL | 1 refills | Status: DC

## 2024-02-28 MED ORDER — FLUOXETINE HCL 60 MG PO TABS: 1.0000 | ORAL_TABLET | Freq: Every day | ORAL | 1 refills | Status: DC

## 2024-02-28 NOTE — Patient Instructions (Addendum)
 It was nice to see you today,  We addressed the following topics today: -Your left ankle sizes 11.25 inches and calf size is 18 inches.  Your right ankle size is 10.5 inches and calf size is 18 inches.  Use this when deciding which size stockings to use.  Try to go for the ones that are above the knee to help prevent them from falling down. - I will send in your Ozempic.  We will titrate this up to 1 mg and keep it there until I see you again - I have sent in your other medications. - I have added Celebrex for your joint pain.  If you would rather have meloxicam then this let us know and I can send that in instead.   Have a great day,  Frederic Jericho, MD

## 2024-02-28 NOTE — Assessment & Plan Note (Signed)
 Shoulders elbows and knees most affected.  Has used meloxicam in the past.  Willing to try Celebrex.  If she does not feel this is effective can switch to meloxicam.

## 2024-02-28 NOTE — Assessment & Plan Note (Signed)
 Continue management per oncology.  No treatment currently.  Monitoring CBCs routinely.

## 2024-02-28 NOTE — Assessment & Plan Note (Signed)
 Blood pressure at goal.  Continue Zestoretic.

## 2024-02-28 NOTE — Assessment & Plan Note (Signed)
 Continue atorvastatin.  Currently cutting 10 mg tablets into quarters.  Tolerates dosing well, dose was reduced by PCP for low LDL levels

## 2024-02-28 NOTE — Assessment & Plan Note (Signed)
 Most likely venous incompetence.  remeasured patient's legs and advised her to look for the correct size when getting compression stockings.

## 2024-02-28 NOTE — Progress Notes (Signed)
 Established Patient Office Visit  Subjective   Patient ID: Rachel Simmons, female    DOB: 10-09-1966  Age: 58 y.o. MRN: [id number]  Chief Complaint  Patient presents with   Medical Management of Chronic Issues    HPI  Lymphocytosis -patient sees heme-onc regularly for management of her CLL.  Currently not receiving any treatment for it.  Dm2 -patient had not received Ozempic lately due to insurance issues.  She never went beyond 0.25 mg.  Discussed titrating up the dose of Ozempic over the next few months.  Patient continues to take atorvastatin for hyperlipidemia and lisinopril-HCTZ for hypertension.  Patient complains of chronic issues with joint aches.  In the past she had been on meloxicam but has not taken that in a while.  That helped somewhat.  Joints affected include knees elbows and shoulders.  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  Knees feel most stiff after sitting for a while  Patient complaining of swelling in her legs bilaterally.  States the swelling worsens throughout the day.  Improves when she elevates her legs.  She wears compression stockings currently.   The ASCVD Risk score (Arnett DK, et al., 2019) failed to calculate for the following reasons:   The valid total cholesterol range is 130 to 320 mg/dL  Health Maintenance Due  Topic Date Due   Pneumococcal Vaccine 33-36 Years old (2 of 2 - PCV) 10/18/2017   COVID-19 Vaccine (3 - Pfizer risk series) 04/21/2020   OPHTHALMOLOGY EXAM  09/17/2020   Zoster Vaccines- Shingrix (2 of 2) 11/10/2023   Diabetic kidney evaluation - Urine ACR  03/07/2024      Objective:     BP 101/67   Pulse 73   Ht 5\' 9"  (1.753 m)   Wt 260 lb 12.8 oz (118.3 kg)   SpO2 98%   BMI 38.51 kg/m    Physical Exam General: Alert, oriented CV: Rate rate rhythm Pulmonary: Lungs good bilaterally Extremities: Trace pitting edema in the legs bilaterally.  No tenderness.   No results found for any visits on  02/28/24.      Assessment & Plan:   CLL (chronic lymphocytic leukemia) (HCC) Assessment & Plan: Continue management per oncology.  No treatment currently.  Monitoring CBCs routinely.   Hyperlipidemia associated with type 2 diabetes mellitus (HCC) Assessment & Plan: Continue atorvastatin.  Currently cutting 10 mg tablets into quarters.  Tolerates dosing well, dose was reduced by PCP for low LDL levels  Orders: -     Atorvastatin Calcium; Take 0.5 tablets (5 mg total) by mouth daily.  Dispense: 45 tablet; Refill: 1  Anxiety and depression -     FLUoxetine HCl; Take 1 tablet by mouth daily.  Dispense: 90 tablet; Refill: 1  Other specified hypothyroidism Assessment & Plan: Continue levothyroxine 112 mcg.  Orders: -     Levothyroxine Sodium; Take 1 tablet (112 mcg total) by mouth daily before breakfast.  Dispense: 90 tablet; Refill: 1  Hypertension associated with diabetes (HCC) Assessment & Plan: Blood pressure at goal.  Continue Zestoretic.  Orders: -     Lisinopril-hydroCHLOROthiazide; Take 1 tablet by mouth daily.  Dispense: 90 tablet; Refill: 1  Type 2 diabetes mellitus without complication, without long-term current use of insulin (HCC) Assessment & Plan: Will send in Ozempic and titrate up to 1 mg dosing.  Orders: -     Ozempic (0.25 or 0.5 MG/DOSE); Inject 0.25 mg into the skin once a week. Increase  to 0.5 mg after 4 weeks.  Dispense: 1.5 mL; Refill: 1  Arthralgia, unspecified joint Assessment & Plan: Shoulders elbows and knees most affected.  Has used meloxicam in the past.  Willing to try Celebrex.  If she does not feel this is effective can switch to meloxicam.   Lower extremity edema Assessment & Plan: Most likely venous incompetence.  remeasured patient's legs and advised her to look for the correct size when getting compression stockings.   Other orders -     Celecoxib; Take 1 capsule (100 mg total) by mouth 2 (two) times daily.  Dispense: 60 capsule;  Refill: 3     Return in about 6 months (around 08/30/2024) for HTN, hld, prediabetes.    Sandre Kitty, MD

## 2024-02-28 NOTE — Assessment & Plan Note (Signed)
 Continue levothyroxine 112 mcg

## 2024-02-28 NOTE — Assessment & Plan Note (Signed)
 Will send in Ozempic and titrate up to 1 mg dosing.

## 2024-05-23 ENCOUNTER — Other Ambulatory Visit: Payer: Self-pay | Admitting: Family Medicine

## 2024-05-23 ENCOUNTER — Other Ambulatory Visit: Payer: Self-pay | Admitting: Oncology

## 2024-05-23 DIAGNOSIS — E119 Type 2 diabetes mellitus without complications: Secondary | ICD-10-CM

## 2024-05-23 DIAGNOSIS — C9110 Chronic lymphocytic leukemia of B-cell type not having achieved remission: Secondary | ICD-10-CM

## 2024-05-23 NOTE — Progress Notes (Unsigned)
 [facility]  [address] [place],  [place]  [address] [phone]  Clinic Day:  05/24/2024  Referring physician: Noreene Bearded, PA  HISTORY OF PRESENT ILLNESS:  Rachel Simmons is a 58 y.o. female with chronic lymphocytic leukemia, which was diagnosed per flow cytometry of her peripheral blood in June 2016.  As her disease has behaved very indolently, it has been followed conservatively, without any particular intervention.  She comes in today for routine follow up.  Since her last visit, the patient has been doing well.  She denies having any B symptoms or bulky lymphadenopathy which concerns her for catabolic progression of her CLL.    VITALS:  Blood pressure 120/69, pulse 64, temperature 97.7 F (36.5 C), temperature source Oral, resp. rate 14, height 5' 9 (1.753 m), weight 266 lb (120.7 kg), SpO2 97%.  Wt Readings from Last 3 Encounters:  05/24/24 266 lb (120.7 kg)  02/28/24 260 lb 12.8 oz (118.3 kg)  09/15/23 252 lb (114.3 kg)    Body mass index is 39.28 kg/m.  Performance status (ECOG): 0 - Asymptomatic  PHYSICAL EXAM:  Physical Exam Constitutional:      General: She is not in acute distress.    Appearance: Normal appearance. She is normal weight.  HENT:     Head: Normocephalic and atraumatic.   Eyes:     General: No scleral icterus.    Extraocular Movements: Extraocular movements intact.     Conjunctiva/sclera: Conjunctivae normal.     Pupils: Pupils are equal, round, and reactive to light.    Cardiovascular:     Rate and Rhythm: Normal rate and regular rhythm.     Pulses: Normal pulses.     Heart sounds: Normal heart sounds. No murmur heard.    No friction rub. No gallop.  Pulmonary:     Effort: Pulmonary effort is normal. No respiratory distress.     Breath sounds: Normal breath sounds.  Abdominal:     General: Bowel sounds are normal. There is no distension.     Palpations: Abdomen is soft. There is no  hepatomegaly, splenomegaly or mass.     Tenderness: There is no abdominal tenderness.   Musculoskeletal:        General: Normal range of motion.     Cervical back: Normal range of motion and neck supple.     Right lower leg: No edema.     Left lower leg: No edema.  Lymphadenopathy:     Cervical: No cervical adenopathy.   Skin:    General: Skin is warm and dry.   Neurological:     General: No focal deficit present.     Mental Status: She is alert and oriented to person, place, and time. Mental status is at baseline.   Psychiatric:        Mood and Affect: Mood normal.        Behavior: Behavior normal.        Thought Content: Thought content normal.        Judgment: Judgment normal.    LABS:    Latest Reference Range & Units 05/24/24 08:50  WBC 4.0 - 10.5 K/uL 14.0 (H)  RBC 3.87 - 5.11 MIL/uL 4.73  Hemoglobin 12.0 - 15.0 g/dL 13.0  HCT 86.5 - 78.4 % 40.1  MCV 80.0 - 100.0 fL 84.8  MCH 26.0 - 34.0 pg 28.5  MCHC 30.0 - 36.0 g/dL 69.6  RDW 29.5 - 28.4 % 13.4  Platelets 150 -  400 K/uL 225  nRBC 0.0 - 0.2 % 0.1  Neutrophils % 26  Lymphocytes % 66  Monocytes Relative % 5  Eosinophil % 2  Basophil % 1  Immature Granulocytes % 0  (H): Data is abnormally high  ASSESSMENT & PLAN:  A 58 year old woman with chronic lymphocytic leukemia.  There remains nothing per her labs or physical exam today which suggests her disease is progressing to where intervention is needed.  Overall, the patient continues to do very well.  I will see her back in 1 year for repeat clinical assessment.  The patient understands all the plans discussed today and is in agreement with them.  Lima Chillemi Felicia Horde, MD

## 2024-05-24 ENCOUNTER — Telehealth: Payer: Self-pay | Admitting: Oncology

## 2024-05-24 ENCOUNTER — Other Ambulatory Visit: Payer: Self-pay | Admitting: Oncology

## 2024-05-24 ENCOUNTER — Encounter

## 2024-05-24 ENCOUNTER — Inpatient Hospital Stay: Attending: Oncology | Admitting: Oncology

## 2024-05-24 ENCOUNTER — Inpatient Hospital Stay

## 2024-05-24 VITALS — BP 120/69 | HR 64 | Temp 97.7°F | Resp 14 | Ht 69.0 in | Wt 266.0 lb

## 2024-05-24 DIAGNOSIS — C9110 Chronic lymphocytic leukemia of B-cell type not having achieved remission: Secondary | ICD-10-CM | POA: Diagnosis present

## 2024-05-24 LAB — CBC WITH DIFFERENTIAL (CANCER CENTER ONLY)
Abs Immature Granulocytes: 0 10*3/uL (ref 0.00–0.07)
Basophils Absolute: 0.1 10*3/uL (ref 0.0–0.1)
Basophils Relative: 1 %
Eosinophils Absolute: 0.3 10*3/uL (ref 0.0–0.5)
Eosinophils Relative: 2 %
HCT: 40.1 % (ref 36.0–46.0)
Hemoglobin: 13.5 g/dL (ref 12.0–15.0)
Immature Granulocytes: 0 %
Lymphocytes Relative: 66 %
Lymphs Abs: 9.3 10*3/uL — ABNORMAL HIGH (ref 0.7–4.0)
MCH: 28.5 pg (ref 26.0–34.0)
MCHC: 33.7 g/dL (ref 30.0–36.0)
MCV: 84.8 fL (ref 80.0–100.0)
Monocytes Absolute: 0.7 10*3/uL (ref 0.1–1.0)
Monocytes Relative: 5 %
Neutro Abs: 3.6 10*3/uL (ref 1.7–7.7)
Neutrophils Relative %: 26 %
Platelet Count: 225 10*3/uL (ref 150–400)
RBC: 4.73 MIL/uL (ref 3.87–5.11)
RDW: 13.4 % (ref 11.5–15.5)
Smear Review: NORMAL
WBC Count: 14 10*3/uL — ABNORMAL HIGH (ref 4.0–10.5)
nRBC: 0.1 % (ref 0.0–0.2)

## 2024-05-24 NOTE — Telephone Encounter (Signed)
 Patient has been scheduled for follow-up visit per 05/24/24 LOS.  Pt given an appt calendar with date and time.

## 2024-06-10 ENCOUNTER — Other Ambulatory Visit: Payer: Self-pay | Admitting: Family Medicine

## 2024-06-10 DIAGNOSIS — Z1231 Encounter for screening mammogram for malignant neoplasm of breast: Secondary | ICD-10-CM

## 2024-06-13 ENCOUNTER — Encounter: Payer: Self-pay | Admitting: Family Medicine

## 2024-06-13 ENCOUNTER — Other Ambulatory Visit: Payer: Self-pay

## 2024-06-13 DIAGNOSIS — F419 Anxiety disorder, unspecified: Secondary | ICD-10-CM

## 2024-06-13 DIAGNOSIS — E119 Type 2 diabetes mellitus without complications: Secondary | ICD-10-CM

## 2024-06-14 ENCOUNTER — Other Ambulatory Visit: Payer: Self-pay | Admitting: Family Medicine

## 2024-06-14 DIAGNOSIS — I152 Hypertension secondary to endocrine disorders: Secondary | ICD-10-CM

## 2024-06-14 DIAGNOSIS — E038 Other specified hypothyroidism: Secondary | ICD-10-CM

## 2024-06-14 DIAGNOSIS — F419 Anxiety disorder, unspecified: Secondary | ICD-10-CM

## 2024-06-14 DIAGNOSIS — E119 Type 2 diabetes mellitus without complications: Secondary | ICD-10-CM

## 2024-06-14 DIAGNOSIS — E1169 Type 2 diabetes mellitus with other specified complication: Secondary | ICD-10-CM

## 2024-06-14 MED ORDER — FLUOXETINE HCL 60 MG PO TABS: 1.0000 | ORAL_TABLET | Freq: Every day | ORAL | 2 refills | Status: AC

## 2024-06-14 MED ORDER — LISINOPRIL-HYDROCHLOROTHIAZIDE 20-25 MG PO TABS: 1.0000 | ORAL_TABLET | Freq: Every day | ORAL | 2 refills | Status: AC

## 2024-06-14 MED ORDER — ATORVASTATIN CALCIUM 10 MG PO TABS
5.0000 mg | ORAL_TABLET | Freq: Every day | ORAL | 2 refills | Status: AC
Start: 2024-06-14 — End: ?

## 2024-06-14 MED ORDER — OZEMPIC (0.25 OR 0.5 MG/DOSE) 2 MG/3ML ~~LOC~~ SOPN: 0.5000 mg | PEN_INJECTOR | SUBCUTANEOUS | 2 refills | Status: DC

## 2024-06-14 MED ORDER — CELECOXIB 100 MG PO CAPS: 100.0000 mg | ORAL_CAPSULE | Freq: Two times a day (BID) | ORAL | 2 refills | Status: AC

## 2024-06-14 MED ORDER — LEVOTHYROXINE SODIUM 112 MCG PO TABS: 112.0000 ug | ORAL_TABLET | Freq: Every day | ORAL | 2 refills | Status: AC

## 2024-06-14 NOTE — Telephone Encounter (Signed)
 Called patient she stated that it is .5 and everything goes to [facility]

## 2024-06-14 NOTE — Telephone Encounter (Signed)
 Can you call the patient and ask her what dose of the ozempic  she would like to be sent in and if everything needs to go to [facility]?

## 2024-06-17 ENCOUNTER — Ambulatory Visit
Admission: RE | Admit: 2024-06-17 | Discharge: 2024-06-17 | Disposition: A | Discharge status: Home / Self Care | Source: Ambulatory Visit

## 2024-06-17 DIAGNOSIS — Z1231 Encounter for screening mammogram for malignant neoplasm of breast: Secondary | ICD-10-CM

## 2024-08-22 ENCOUNTER — Other Ambulatory Visit (HOSPITAL_BASED_OUTPATIENT_CLINIC_OR_DEPARTMENT_OTHER): Payer: Self-pay | Admitting: *Deleted

## 2024-08-22 DIAGNOSIS — E119 Type 2 diabetes mellitus without complications: Secondary | ICD-10-CM

## 2024-08-22 DIAGNOSIS — I152 Hypertension secondary to endocrine disorders: Secondary | ICD-10-CM

## 2024-08-22 DIAGNOSIS — E559 Vitamin D deficiency, unspecified: Secondary | ICD-10-CM

## 2024-08-22 DIAGNOSIS — E039 Hypothyroidism, unspecified: Secondary | ICD-10-CM

## 2024-08-22 DIAGNOSIS — E1169 Type 2 diabetes mellitus with other specified complication: Secondary | ICD-10-CM

## 2024-08-23 NOTE — Addendum Note (Signed)
 Addended by: CHANDRA TORIBIO POUR on: 08/23/2024 12:50 PM   Modules accepted: Orders

## 2024-08-26 ENCOUNTER — Other Ambulatory Visit

## 2024-08-26 DIAGNOSIS — E039 Hypothyroidism, unspecified: Secondary | ICD-10-CM

## 2024-08-26 DIAGNOSIS — E1159 Type 2 diabetes mellitus with other circulatory complications: Secondary | ICD-10-CM

## 2024-08-26 DIAGNOSIS — E119 Type 2 diabetes mellitus without complications: Secondary | ICD-10-CM

## 2024-08-26 DIAGNOSIS — E785 Hyperlipidemia, unspecified: Secondary | ICD-10-CM

## 2024-08-26 DIAGNOSIS — E559 Vitamin D deficiency, unspecified: Secondary | ICD-10-CM

## 2024-08-27 ENCOUNTER — Ambulatory Visit: Payer: Self-pay

## 2024-08-27 LAB — CBC WITH DIFFERENTIAL/PLATELET
Basophils Absolute: 0.1 x10E3/uL (ref 0.0–0.2)
Basos: 1 %
EOS (ABSOLUTE): 0.2 x10E3/uL (ref 0.0–0.4)
Eos: 1 %
Hematocrit: 41.7 % (ref 34.0–46.6)
Hemoglobin: 13.5 g/dL (ref 11.1–15.9)
Immature Grans (Abs): 0 x10E3/uL (ref 0.0–0.1)
Immature Granulocytes: 0 %
Lymphocytes Absolute: 12.2 x10E3/uL — ABNORMAL HIGH (ref 0.7–3.1)
Lymphs: 75 %
MCH: 29.2 pg (ref 26.6–33.0)
MCHC: 32.4 g/dL (ref 31.5–35.7)
MCV: 90 fL (ref 79–97)
Monocytes Absolute: 0.6 x10E3/uL (ref 0.1–0.9)
Monocytes: 4 %
Neutrophils Absolute: 3.1 x10E3/uL (ref 1.4–7.0)
Neutrophils: 19 %
Platelets: 274 x10E3/uL (ref 150–450)
RBC: 4.62 x10E6/uL (ref 3.77–5.28)
RDW: 13.6 % (ref 11.7–15.4)
WBC: 16.3 x10E3/uL — ABNORMAL HIGH (ref 3.4–10.8)

## 2024-08-27 LAB — VITAMIN D 25 HYDROXY (VIT D DEFICIENCY, FRACTURES): Vit D, 25-Hydroxy: 23.6 ng/mL — ABNORMAL LOW (ref 30.0–100.0)

## 2024-08-27 LAB — LIPID PANEL
Chol/HDL Ratio: 2.7 ratio (ref 0.0–4.4)
Cholesterol, Total: 141 mg/dL (ref 100–199)
HDL: 53 mg/dL (ref >39)
LDL Chol Calc (NIH): 55 mg/dL (ref 0–99)
Triglycerides: 206 mg/dL — ABNORMAL HIGH (ref 0–149)
VLDL Cholesterol Cal: 33 mg/dL (ref 5–40)

## 2024-08-27 LAB — HEMOGLOBIN A1C
Est. average glucose Bld gHb Est-mCnc: 134 mg/dL
Hgb A1c MFr Bld: 6.3 % — ABNORMAL HIGH (ref 4.8–5.6)

## 2024-08-27 LAB — COMPREHENSIVE METABOLIC PANEL WITH GFR
ALT: 20 IU/L (ref 0–32)
AST: 18 IU/L (ref 0–40)
Albumin: 4 g/dL (ref 3.8–4.9)
Alkaline Phosphatase: 79 IU/L (ref 49–135)
BUN/Creatinine Ratio: 24 — ABNORMAL HIGH (ref 9–23)
BUN: 13 mg/dL (ref 6–24)
Bilirubin Total: 0.3 mg/dL (ref 0.0–1.2)
CO2: 26 mmol/L (ref 20–29)
Calcium: 10.3 mg/dL — ABNORMAL HIGH (ref 8.7–10.2)
Chloride: 101 mmol/L (ref 96–106)
Creatinine, Ser: 0.55 mg/dL — ABNORMAL LOW (ref 0.57–1.00)
Globulin, Total: 2.3 g/dL (ref 1.5–4.5)
Glucose: 114 mg/dL — ABNORMAL HIGH (ref 70–99)
Potassium: 4.1 mmol/L (ref 3.5–5.2)
Sodium: 139 mmol/L (ref 134–144)
Total Protein: 6.3 g/dL (ref 6.0–8.5)
eGFR: 108 mL/min/1.73 (ref >59)

## 2024-08-27 LAB — TSH: TSH: 1.3 u[IU]/mL (ref 0.450–4.500)

## 2024-09-02 ENCOUNTER — Ambulatory Visit: Admitting: Family Medicine

## 2024-09-02 VITALS — BP 110/71 | HR 65 | Ht 69.0 in | Wt 269.4 lb

## 2024-09-02 DIAGNOSIS — E119 Type 2 diabetes mellitus without complications: Secondary | ICD-10-CM

## 2024-09-02 DIAGNOSIS — E785 Hyperlipidemia, unspecified: Secondary | ICD-10-CM

## 2024-09-02 DIAGNOSIS — E1169 Type 2 diabetes mellitus with other specified complication: Secondary | ICD-10-CM

## 2024-09-02 DIAGNOSIS — D2330 Other benign neoplasm of skin of unspecified part of face: Secondary | ICD-10-CM | POA: Insufficient documentation

## 2024-09-02 DIAGNOSIS — E559 Vitamin D deficiency, unspecified: Secondary | ICD-10-CM

## 2024-09-02 DIAGNOSIS — M542 Cervicalgia: Secondary | ICD-10-CM | POA: Diagnosis not present

## 2024-09-02 MED ORDER — METHOCARBAMOL 500 MG PO TABS: 500.0000 mg | ORAL_TABLET | Freq: Four times a day (QID) | ORAL | 0 refills | Status: AC

## 2024-09-02 NOTE — Assessment & Plan Note (Signed)
-   Acute neck pain likely secondary to muscle spasm from sleeping position. Pain is localized to the neck, radiating to behind the right ear, worse with head rotation. No radicular symptoms. - Continue symptomatic relief with heat therapy and topical analgesics (e.g., Voltaren  gel, Icy Hot). - May use Tylenol as needed for pain. - Prescribed Robaxin . Counseled to take at bedtime initially due to potential for drowsiness. - Continue neck stretching exercises as tolerated. - If no improvement in six weeks, to follow-up for re-evaluation. - No other changes to medications.

## 2024-09-02 NOTE — Assessment & Plan Note (Addendum)
-   Currently managed with diet alone. A1c is 6.3. Last took Ozempic  approximately 5 months ago. Has gained 10 pounds since stopping the medication. Patient wishes to continue with diet control at this time. - Plan to check labs including CMP, lipid panel, and UACR. - Last eye exam was two weeks ago; will request records from Dr. My le.

## 2024-09-02 NOTE — Patient Instructions (Addendum)
 It was nice to see you today,  We addressed the following topics today: -I will send in a prescription for a muscle relaxant called Robaxin .  This can make you drowsy so when you first try taking it take it before bed. - Continue using topical pain relievers, heat, stretching of the neck, and Tylenol as needed. - If it is not getting better after 6 weeks let us  know - No other changes to medications. - You can increase your vitamin D  to 2000 units a day.  Have a great day,  Rolan Slain, MD

## 2024-09-02 NOTE — Assessment & Plan Note (Signed)
-   Multiple new facial papules, present for one month. Examination suggests benign lesions such as skin tags or seborrheic hyperplasia. Low suspicion for malignancy. - Will send a referral to dermatology for evaluation and potential removal of eyelid lesion if bothersome. Photos added to chart for dermatologist review.

## 2024-09-02 NOTE — Assessment & Plan Note (Signed)
-   History of mild hypercalcemia. Past workup for hyperparathyroidism was ordered in 2023 but never completed. The elevation is mild and likely not clinically significant, but will re-evaluate. - Will add a PTH level to next lab draw to rule out primary hyperparathyroidism.

## 2024-09-02 NOTE — Assessment & Plan Note (Signed)
-   Recent labs showed low vitamin D  despite taking 1000 mcg OTC daily. - Increase vitamin D  supplementation to 2000 units daily. Counseled that up to 4000-5000 units daily is safe. - Recheck vitamin D  level with next routine labs.

## 2024-09-02 NOTE — Progress Notes (Unsigned)
 Established Patient Office Visit  Subjective   Patient ID: Rachel Simmons, female    DOB: 07/14/1966  Age: 58 y.o. MRN: [id number]  Chief Complaint  Patient presents with   Medical Management of Chronic Issues    HPI  My Rachel Simmons dr.   Subjective - Reports acute onset neck pain since Friday (08/30/2024) upon waking. Describes it as tightness. Pain radiates up to behind the right ear. Pain is exacerbated by turning head to the left, and to a lesser extent, to the right. Has tried heat/cold therapy and Tiger Balm with minimal relief. Denies numbness, tingling, or weakness in the arms. Reports an associated mild headache, for which no analgesia has been taken. This is more severe than previous episodes of neck stiffness from sleeping incorrectly, which usually resolved with stretching. - Reports new skin lesions on the face that appeared about one month ago. One under the nose, two on the cheek, and one on the eyelid. No associated symptoms. Mother had similar lesions that would come and go. Concerned about the nature of these bumps and if a dermatology referral is needed.  Medications Current medications include Lipitor 10 mg (half tablet) daily, Celebrex  twice a day, Zyrtec once a day, iron supplement daily, fluoxetine  (Prozac ) daily, Synthroid  (levothyroxine ) daily, and Zestoretic  (lisinopril ) one tablet a day. Reports not taking Ozempic  for approximately five months due to insurance coverage issues and high cost ($700+ at [facility]). History of Metformin  use, which was weaned off with diet control. Also takes OTC vitamin D  1000 mcg daily.  PMH, PSH, FH, Social Hx PMHx: Hyperlipidemia, arthritis (inferred from Celebrex ), allergies, iron deficiency anemia, hypothyroidism, depression, hypertension, type 2 diabetes mellitus (currently diet-controlled), history of mildly elevated calcium , chronic lymphocytic leukemia (CLL, stable, followed annually), low vitamin D . Had a mammogram this year, results were  clear. Last eye exam was two weeks ago with Dr. Myle. FH: Mother had similar skin lesions.  ROS HEENT: Reports mild headache. Denies significant visual changes. MSK: Reports neck pain and stiffness. Denies arm weakness. Neuro: Denies numbness or tingling in the extremities. Skin: Reports new facial papules.     The 10-year ASCVD risk score (Arnett DK, et al., 2019) is: 3.1%  Health Maintenance Due  Topic Date Due   Hepatitis B Vaccines 19-59 Average Risk (1 of 3 - 19+ 3-dose series) Never done   Pneumococcal Vaccine: 50+ Years (2 of 2 - PCV) 10/18/2017   COVID-19 Vaccine (3 - Pfizer risk series) 04/21/2020   OPHTHALMOLOGY EXAM  09/17/2020   Zoster Vaccines- Shingrix  (2 of 2) 11/10/2023   Diabetic kidney evaluation - Urine ACR  03/07/2024   FOOT EXAM  03/07/2024   Influenza Vaccine  07/05/2024      Objective:     BP 110/71   Pulse 65   Ht 5' 9 (1.753 m)   Wt 269 lb 6.4 oz (122.2 kg)   SpO2 98%   BMI 39.78 kg/m  {Vitals History (Optional):23777}  Physical Exam Gen: alert, oriented Pulm: no respiratory distress Psych: pleasant affect   No results found for any visits on 09/02/24.      Assessment & Plan:   Type 2 diabetes mellitus without complication, without long-term current use of insulin (HCC) Assessment & Plan: - Currently managed with diet alone. A1c is 6.3. Last took Ozempic  approximately 5 months ago. Has gained 10 pounds since stopping the medication. Patient wishes to continue with diet control at this time. - Plan to check labs including CMP, lipid panel, and  UACR. - Last eye exam was two weeks ago; will request records from Dr. My le.  Orders: -     Microalbumin / creatinine urine ratio  Cervicalgia Assessment & Plan: - Acute neck pain likely secondary to muscle spasm from sleeping position. Pain is localized to the neck, radiating to behind the right ear, worse with head rotation. No radicular symptoms. - Continue symptomatic relief with heat  therapy and topical analgesics (e.g., Voltaren  gel, Icy Hot). - May use Tylenol as needed for pain. - Prescribed Robaxin . Counseled to take at bedtime initially due to potential for drowsiness. - Continue neck stretching exercises as tolerated. - If no improvement in six weeks, to follow-up for re-evaluation. - No other changes to medications.   Hypercalcemia Assessment & Plan: - History of mild hypercalcemia. Past workup for hyperparathyroidism was ordered in 2023 but never completed. The elevation is mild and likely not clinically significant, but will re-evaluate. - Will add a PTH level to next lab draw to rule out primary hyperparathyroidism.   Vitamin D  deficiency Assessment & Plan: - Recent labs showed low vitamin D  despite taking 1000 mcg OTC daily. - Increase vitamin D  supplementation to 2000 units daily. Counseled that up to 4000-5000 units daily is safe. - Recheck vitamin D  level with next routine labs.   Fibrous papule of face Assessment & Plan: - Multiple new facial papules, present for one month. Examination suggests benign lesions such as skin tags or seborrheic hyperplasia. Low suspicion for malignancy. - Will send a referral to dermatology for evaluation and potential removal of eyelid lesion if bothersome. Photos added to chart for dermatologist review.   Other orders -     Methocarbamol ; Take 1 tablet (500 mg total) by mouth 4 (four) times daily.  Dispense: 30 tablet; Refill: 0     Return in about 6 months (around 03/02/2025) for DM.    Toribio MARLA Slain, MD

## 2024-09-03 LAB — MICROALBUMIN / CREATININE URINE RATIO
Creatinine, Urine: 132.2 mg/dL
Microalb/Creat Ratio: 4 mg/g{creat} (ref 0–29)
Microalbumin, Urine: 5.8 ug/mL

## 2024-12-18 DIAGNOSIS — E119 Type 2 diabetes mellitus without complications: Secondary | ICD-10-CM

## 2024-12-20 MED ORDER — METFORMIN HCL 500 MG PO TABS: 500.0000 mg | ORAL_TABLET | Freq: Every day | ORAL | 2 refills | Status: AC

## 2024-12-20 MED ORDER — TRULICITY 0.75 MG/0.5ML ~~LOC~~ SOAJ: 0.7500 mg | SUBCUTANEOUS | 2 refills | Status: AC

## 2025-02-24 ENCOUNTER — Other Ambulatory Visit

## 2025-02-27 ENCOUNTER — Ambulatory Visit

## 2025-03-03 ENCOUNTER — Ambulatory Visit

## 2025-05-23 ENCOUNTER — Ambulatory Visit: Admitting: Oncology

## 2025-05-23 ENCOUNTER — Other Ambulatory Visit
